# Patient Record
Sex: Male | Born: 1975 | Race: Black or African American | Hispanic: No | Marital: Single
Health system: Southern US, Community
[De-identification: ages and names within clinical notes are randomized; demographics above are authoritative.]

## PROBLEM LIST (undated history)

## (undated) DIAGNOSIS — E119 Type 2 diabetes mellitus without complications: Secondary | ICD-10-CM

## (undated) DIAGNOSIS — G510 Bell's palsy: Secondary | ICD-10-CM

## (undated) DIAGNOSIS — N189 Chronic kidney disease, unspecified: Secondary | ICD-10-CM

## (undated) HISTORY — DX: Bell's palsy: G51.0

---

## 2009-03-28 ENCOUNTER — Emergency Department (HOSPITAL_COMMUNITY): Admission: EM | Admit: 2009-03-28 | Discharge: 2009-03-28 | Payer: Self-pay | Admitting: Emergency Medicine

## 2010-07-30 ENCOUNTER — Encounter (HOSPITAL_BASED_OUTPATIENT_CLINIC_OR_DEPARTMENT_OTHER)
Admission: RE | Admit: 2010-07-30 | Discharge: 2010-08-07 | Payer: Self-pay | Source: Home / Self Care | Attending: Internal Medicine | Admitting: Internal Medicine

## 2010-07-30 ENCOUNTER — Ambulatory Visit (HOSPITAL_COMMUNITY)
Admission: RE | Admit: 2010-07-30 | Discharge: 2010-07-30 | Payer: Self-pay | Source: Home / Self Care | Attending: Internal Medicine | Admitting: Internal Medicine

## 2010-08-13 ENCOUNTER — Encounter (HOSPITAL_BASED_OUTPATIENT_CLINIC_OR_DEPARTMENT_OTHER): Payer: Self-pay | Attending: Plastic Surgery

## 2010-08-13 DIAGNOSIS — I10 Essential (primary) hypertension: Secondary | ICD-10-CM | POA: Insufficient documentation

## 2010-08-13 DIAGNOSIS — Z794 Long term (current) use of insulin: Secondary | ICD-10-CM | POA: Insufficient documentation

## 2010-08-13 DIAGNOSIS — E1165 Type 2 diabetes mellitus with hyperglycemia: Secondary | ICD-10-CM | POA: Insufficient documentation

## 2010-08-13 DIAGNOSIS — IMO0002 Reserved for concepts with insufficient information to code with codable children: Secondary | ICD-10-CM | POA: Insufficient documentation

## 2010-08-13 DIAGNOSIS — L97509 Non-pressure chronic ulcer of other part of unspecified foot with unspecified severity: Secondary | ICD-10-CM | POA: Insufficient documentation

## 2010-08-13 DIAGNOSIS — E785 Hyperlipidemia, unspecified: Secondary | ICD-10-CM | POA: Insufficient documentation

## 2010-09-10 ENCOUNTER — Encounter (HOSPITAL_BASED_OUTPATIENT_CLINIC_OR_DEPARTMENT_OTHER): Payer: Self-pay

## 2010-10-08 ENCOUNTER — Encounter (HOSPITAL_BASED_OUTPATIENT_CLINIC_OR_DEPARTMENT_OTHER): Payer: Self-pay

## 2010-10-12 LAB — CBC
HCT: 46.8 % (ref 39.0–52.0)
Hemoglobin: 16.1 g/dL (ref 13.0–17.0)
MCV: 82 fL (ref 78.0–100.0)
Platelets: 230 10*3/uL (ref 150–400)
WBC: 4.9 10*3/uL (ref 4.0–10.5)

## 2010-10-12 LAB — COMPREHENSIVE METABOLIC PANEL
Albumin: 4 g/dL (ref 3.5–5.2)
Alkaline Phosphatase: 131 U/L — ABNORMAL HIGH (ref 39–117)
BUN: 7 mg/dL (ref 6–23)
CO2: 29 mEq/L (ref 19–32)
Chloride: 99 mEq/L (ref 96–112)
Creatinine, Ser: 0.74 mg/dL (ref 0.4–1.5)
GFR calc non Af Amer: >60 mL/min (ref >60)
Glucose, Bld: 399 mg/dL — ABNORMAL HIGH (ref 70–99)
Potassium: 4.2 mEq/L (ref 3.5–5.1)
Total Bilirubin: 0.7 mg/dL (ref 0.3–1.2)

## 2010-10-12 LAB — URINALYSIS, ROUTINE W REFLEX MICROSCOPIC
Glucose, UA: >1000 mg/dL — AB
Hgb urine dipstick: NEGATIVE
Ketones, ur: 15 mg/dL — AB
Protein, ur: NEGATIVE mg/dL

## 2010-10-12 LAB — DIFFERENTIAL
Eosinophils Absolute: 0.2 10*3/uL (ref 0.0–0.7)
Eosinophils Relative: 5 % (ref 0–5)
Lymphocytes Relative: 43 % (ref 12–46)
Lymphs Abs: 2.1 10*3/uL (ref 0.7–4.0)
Monocytes Relative: 6 % (ref 3–12)
Neutro Abs: 2.3 10*3/uL (ref 1.7–7.7)
Neutrophils Relative %: 46 % (ref 43–77)

## 2010-10-12 LAB — GLUCOSE, CAPILLARY: Glucose-Capillary: 417 mg/dL — ABNORMAL HIGH (ref 70–99)

## 2010-10-12 LAB — URINE MICROSCOPIC-ADD ON: Urine-Other: NONE SEEN

## 2014-02-25 ENCOUNTER — Ambulatory Visit: Payer: Self-pay

## 2014-08-08 DIAGNOSIS — G510 Bell's palsy: Secondary | ICD-10-CM

## 2014-08-08 HISTORY — DX: Bell's palsy: G51.0

## 2014-08-20 ENCOUNTER — Encounter (HOSPITAL_COMMUNITY): Payer: Self-pay | Admitting: *Deleted

## 2014-08-20 ENCOUNTER — Emergency Department (HOSPITAL_COMMUNITY): Payer: 59

## 2014-08-20 ENCOUNTER — Emergency Department (HOSPITAL_COMMUNITY)
Admission: EM | Admit: 2014-08-20 | Discharge: 2014-08-20 | Disposition: A | Discharge status: Home / Self Care | Payer: 59 | Attending: Emergency Medicine | Admitting: Emergency Medicine

## 2014-08-20 DIAGNOSIS — R200 Anesthesia of skin: Secondary | ICD-10-CM | POA: Diagnosis present

## 2014-08-20 DIAGNOSIS — G510 Bell's palsy: Secondary | ICD-10-CM | POA: Diagnosis not present

## 2014-08-20 DIAGNOSIS — Z794 Long term (current) use of insulin: Secondary | ICD-10-CM | POA: Diagnosis not present

## 2014-08-20 DIAGNOSIS — R739 Hyperglycemia, unspecified: Secondary | ICD-10-CM

## 2014-08-20 DIAGNOSIS — E1165 Type 2 diabetes mellitus with hyperglycemia: Secondary | ICD-10-CM | POA: Diagnosis not present

## 2014-08-20 DIAGNOSIS — Z79899 Other long term (current) drug therapy: Secondary | ICD-10-CM | POA: Diagnosis not present

## 2014-08-20 HISTORY — DX: Type 2 diabetes mellitus without complications: E11.9

## 2014-08-20 LAB — CBC WITH DIFFERENTIAL/PLATELET
BASOS PCT: 1 % (ref 0–1)
Basophils Absolute: 0 10*3/uL (ref 0.0–0.1)
EOS PCT: 2 % (ref 0–5)
Eosinophils Absolute: 0.1 10*3/uL (ref 0.0–0.7)
HCT: 40.5 % (ref 39.0–52.0)
HEMOGLOBIN: 13.6 g/dL (ref 13.0–17.0)
LYMPHS ABS: 1.5 10*3/uL (ref 0.7–4.0)
Lymphocytes Relative: 26 % (ref 12–46)
MCH: 26.3 pg (ref 26.0–34.0)
MCHC: 33.6 g/dL (ref 30.0–36.0)
MCV: 78.2 fL (ref 78.0–100.0)
MONO ABS: 0.3 10*3/uL (ref 0.1–1.0)
Monocytes Relative: 6 % (ref 3–12)
Neutro Abs: 3.6 10*3/uL (ref 1.7–7.7)
Neutrophils Relative %: 65 % (ref 43–77)
Platelets: 318 10*3/uL (ref 150–400)
RBC: 5.18 MIL/uL (ref 4.22–5.81)
RDW: 12.9 % (ref 11.5–15.5)
WBC: 5.6 10*3/uL (ref 4.0–10.5)

## 2014-08-20 LAB — URINALYSIS, ROUTINE W REFLEX MICROSCOPIC
Bilirubin Urine: NEGATIVE
Ketones, ur: NEGATIVE mg/dL
LEUKOCYTES UA: NEGATIVE
Nitrite: NEGATIVE
PH: 6.5 (ref 5.0–8.0)
PROTEIN: 100 mg/dL — AB
SPECIFIC GRAVITY, URINE: 1.035 — AB (ref 1.005–1.030)
UROBILINOGEN UA: 1 mg/dL (ref 0.0–1.0)

## 2014-08-20 LAB — I-STAT VENOUS BLOOD GAS, ED
Acid-Base Excess: 3 mmol/L — ABNORMAL HIGH (ref 0.0–2.0)
BICARBONATE: 30.5 meq/L — AB (ref 20.0–24.0)
O2 Saturation: 35 %
PCO2 VEN: 54.9 mmHg — AB (ref 45.0–50.0)
PH VEN: 7.352 — AB (ref 7.250–7.300)
TCO2: 32 mmol/L (ref 0–100)
pO2, Ven: 22 mmHg — CL (ref 30.0–45.0)

## 2014-08-20 LAB — COMPREHENSIVE METABOLIC PANEL
ALK PHOS: 201 U/L — AB (ref 39–117)
ALT: 9 U/L (ref 0–53)
AST: 24 U/L (ref 0–37)
Albumin: 2.5 g/dL — ABNORMAL LOW (ref 3.5–5.2)
Anion gap: 6 (ref 5–15)
BILIRUBIN TOTAL: 0.4 mg/dL (ref 0.3–1.2)
BUN: 11 mg/dL (ref 6–23)
CALCIUM: 8.6 mg/dL (ref 8.4–10.5)
CO2: 31 mmol/L (ref 19–32)
Chloride: 96 mmol/L (ref 96–112)
Creatinine, Ser: 0.92 mg/dL (ref 0.50–1.35)
GFR calc Af Amer: >90 mL/min (ref >90)
Glucose, Bld: 528 mg/dL — ABNORMAL HIGH (ref 70–99)
Potassium: 3.9 mmol/L (ref 3.5–5.1)
SODIUM: 133 mmol/L — AB (ref 135–145)
Total Protein: 6.7 g/dL (ref 6.0–8.3)

## 2014-08-20 LAB — KETONES, QUALITATIVE: Acetone, Bld: NEGATIVE

## 2014-08-20 LAB — URINE MICROSCOPIC-ADD ON

## 2014-08-20 LAB — CBG MONITORING, ED
GLUCOSE-CAPILLARY: 416 mg/dL — AB (ref 70–99)
Glucose-Capillary: 535 mg/dL — ABNORMAL HIGH (ref 70–99)

## 2014-08-20 LAB — TROPONIN I: Troponin I: <0.03 ng/mL (ref <0.031)

## 2014-08-20 MED ORDER — VALACYCLOVIR HCL 1 G PO TABS: 1000.0000 mg | ORAL_TABLET | Freq: Three times a day (TID) | ORAL | Status: AC

## 2014-08-20 MED ORDER — INSULIN ASPART 100 UNIT/ML ~~LOC~~ SOLN: SUBCUTANEOUS | Status: DC

## 2014-08-20 MED ORDER — ARTIFICIAL TEARS OP OINT: TOPICAL_OINTMENT | Freq: Three times a day (TID) | OPHTHALMIC | Status: DC

## 2014-08-20 MED ORDER — VALACYCLOVIR HCL 500 MG PO TABS
1000.0000 mg | ORAL_TABLET | Freq: Once | ORAL | Status: AC
Start: 2014-08-20 — End: 2014-08-20
  Administered 2014-08-20: 1000 mg via ORAL
  Filled 2014-08-20: qty 2

## 2014-08-20 MED ORDER — INSULIN ASPART 100 UNIT/ML ~~LOC~~ SOLN
8.0000 [IU] | Freq: Once | SUBCUTANEOUS | Status: AC
  Administered 2014-08-20: 8 [IU] via SUBCUTANEOUS
  Filled 2014-08-20: qty 1

## 2014-08-20 MED ORDER — SODIUM CHLORIDE 0.9 % IV BOLUS (SEPSIS)
1000.0000 mL | Freq: Once | INTRAVENOUS | Status: AC
  Administered 2014-08-20: 1000 mL via INTRAVENOUS

## 2014-08-20 NOTE — ED Provider Notes (Signed)
CSN: [id number]     Arrival date & time 08/20/14  9326 History  This chart was scribed for Glynn Octave, MD by Leone Payor, ED Scribe. This patient was seen in room B18C/B18C and the patient's care was started 7:53 AM.    Chief Complaint  Patient presents with  . Numbness   The history is provided by the patient. No language interpreter was used.     HPI Comments: Shane Dougherty is a 39 y.o. male with past medical history of DM who presents to the Emergency Department complaining of 4 days of constant, unchanged right sided facial droop. Patient states he has associated difficulty chewing and his right eye blinks slower than the left. He states that friends and family noticed his symptoms before he did. He also complains of an intermittent frontal HA that is similar to his regular migraine. He denies blurry or double vision, gait abnormality, weakness or numbness in extremities, aphasia, rash.   Past Medical History  Diagnosis Date  . Diabetes mellitus without complication    History reviewed. No pertinent past surgical history. History reviewed. No pertinent family history. History  Substance Use Topics  . Smoking status: Passive Smoke Exposure - Never Smoker  . Smokeless tobacco: Not on file  . Alcohol Use: No    Review of Systems  A complete 10 system review of systems was obtained and all systems are negative except as noted in the HPI and PMH.    Allergies  Bee venom  Home Medications   Prior to Admission medications   Medication Sig Start Date End Date Taking? Authorizing Provider  insulin glargine (LANTUS) 100 UNIT/ML injection Inject 1-10 Units into the skin every morning. Sliding scale based on fasting BG   Yes Historical Provider, MD  Multiple Vitamins-Minerals (MULTIVITAMIN WITH MINERALS) tablet Take 1 tablet by mouth daily.   Yes Historical Provider, MD  polyvinyl alcohol (LIQUIFILM TEARS) 1.4 % ophthalmic solution Place 3-4 drops into both eyes as needed for dry  eyes.   Yes Historical Provider, MD  artificial tears (LACRILUBE) OINT ophthalmic ointment Place into the right eye 3 (three) times daily. 08/20/14   Glynn Octave, MD  insulin aspart (NOVOLOG) 100 UNIT/ML injection Your sliding scale with meals 08/20/14   Glynn Octave, MD  valACYclovir (VALTREX) 1000 MG tablet Take 1 tablet (1,000 mg total) by mouth 3 (three) times daily. 08/20/14 09/03/14  Glynn Octave, MD   BP 161/100 mmHg  Pulse 93  Temp(Src) 98 F (36.7 C) (Oral)  Resp 15  Ht 5\' 7"  (1.702 m)  Wt 160 lb (72.576 kg)  BMI 25.05 kg/m2  SpO2 100% Physical Exam  Constitutional: He is oriented to person, place, and time. He appears well-developed and well-nourished. No distress.  HENT:  Head: Normocephalic and atraumatic.  Mouth/Throat: Oropharynx is clear and moist. No oropharyngeal exudate.  Eyes: Conjunctivae and EOM are normal. Pupils are equal, round, and reactive to light.  Neck: Normal range of motion. Neck supple.  No meningismus.  Cardiovascular: Normal rate, regular rhythm, normal heart sounds and intact distal pulses.   No murmur heard. Pulmonary/Chest: Effort normal and breath sounds normal. No respiratory distress.  Abdominal: Soft. There is no tenderness. There is no rebound and no guarding.  Musculoskeletal: Normal range of motion. He exhibits no edema or tenderness.  Neurological: He is alert and oriented to person, place, and time. A cranial nerve deficit is present. Coordination normal.  No ataxia on finger to nose bilaterally. No pronator drift. 5/5 strength  throughout. Right facial droop, tongue midline, forehead flaccid. Negative Romberg. Equal grip strength. Sensation intact. Gait is normal.   Skin: Skin is warm.  Psychiatric: He has a normal mood and affect. His behavior is normal.  Nursing note and vitals reviewed.   ED Course  Procedures (including critical care time)  DIAGNOSTIC STUDIES: Oxygen Saturation is 100% on RA, normal by my interpretation.     COORDINATION OF CARE: 7:57 AM Will order lab work and brain MRI. Discussed treatment plan with pt at bedside and pt agreed to plan.   Labs Review Labs Reviewed  COMPREHENSIVE METABOLIC PANEL - Abnormal; Notable for the following:    Sodium 133 (*)    Glucose, Bld 528 (*)    Albumin 2.5 (*)    Alkaline Phosphatase 201 (*)    All other components within normal limits  URINALYSIS, ROUTINE W REFLEX MICROSCOPIC - Abnormal; Notable for the following:    Specific Gravity, Urine 1.035 (*)    Glucose, UA >1000 (*)    Hgb urine dipstick SMALL (*)    Protein, ur 100 (*)    All other components within normal limits  CBG MONITORING, ED - Abnormal; Notable for the following:    Glucose-Capillary 535 (*)    All other components within normal limits  I-STAT VENOUS BLOOD GAS, ED - Abnormal; Notable for the following:    pH, Ven 7.352 (*)    pCO2, Ven 54.9 (*)    pO2, Ven 22.0 (*)    Bicarbonate 30.5 (*)    Acid-Base Excess 3.0 (*)    All other components within normal limits  CBG MONITORING, ED - Abnormal; Notable for the following:    Glucose-Capillary 416 (*)    All other components within normal limits  CBC WITH DIFFERENTIAL/PLATELET  TROPONIN I  KETONES, QUALITATIVE  URINE MICROSCOPIC-ADD ON  CBG MONITORING, ED    Imaging Review Mr Brain Wo Contrast  08/20/2014   CLINICAL DATA:  Four days of RIGHT facial droop. Difficulty chewing and blinking. Intermittent frontal headaches. History of diabetes.  EXAM: MRI HEAD WITHOUT CONTRAST  TECHNIQUE: Multiplanar, multiecho pulse sequences of the brain and surrounding structures were obtained without intravenous contrast.  COMPARISON:  None.  FINDINGS: The patient was unable to remain motionless for the exam. Small or subtle lesions could be overlooked.  No evidence for acute infarction, hemorrhage, mass lesion, hydrocephalus, or extra-axial fluid. Normal for age cerebral volume. No white matter disease. No midline abnormality or vascular  occlusion.  Extracranial soft tissues demonstrate normal paranasal sinuses and orbits. BILATERAL cervical lymph nodes are incompletely evaluated on this study.  Trace BILATERAL mastoid effusions appear non worrisome. No middle ear fluid. No visible parotid lesion.  IMPRESSION: No evidence for acute intracranial infarction or mass lesion. No posterior fossa pathology.  Trace BILATERAL mastoid effusions without apparent significant temporal bone inflammatory disease.  If the patient has peripheral CN VIIth nerve dysfunction, viral perineuritis or Bell's palsy may not be detected on noncontrast MR imaging.   Electronically Signed   By: Davonna Belling M.D.   On: 08/20/2014 09:24     EKG Interpretation   Date/Time:  Saturday August 20 2014 10:39:38 EST Ventricular Rate:  95 PR Interval:  132 QRS Duration: 76 QT Interval:  347 QTC Calculation: 436 R Axis:   13 Text Interpretation:  Sinus rhythm Probable anteroseptal infarct, old  Borderline ST elevation, lateral leads Baseline wander in lead(s) V3 V4 V5  V6 No previous ECGs available Confirmed by Manus Gunning  MD,  Maham Quintin [phone]) on  08/20/2014 10:53:08 AM      MDM   Final diagnoses:  Bell's palsy  Hyperglycemia   4 day history of right-sided facial numbness and facial droop. No weakness in arms or legs. Suspect bell's palsy given flaccid forehead.  Patient is a diabetic. Blood sugar greater than 500. Patient given IV fluids and insulin.  Hyperglycemia without DKA. Normal anion gap.  Suspect Bell's palsy. MRI negative for acute stroke.  Sugar improved to 419 after IV fluids and insulin. Normal anion gap and no ketones in urine. Patient has had facial droop for 4 days. Will not give steroids given his hyperglycemia noncompliance with his insulin regimen. We'll treat with Valtrex.  D/w Dr. Hosie Poisson who agrees no role for prednisone given delayed presentation hyperglycemia. We'll refill patient's NovoLog insulin which she has been out of. He has  his Lantus. Follow-up with PCP as scheduled. Eye lubrication discussed. Return to ED with their worsening symptoms.  I personally performed the services described in this documentation, which was scribed in my presence. The recorded information has been reviewed and is accurate.   Glynn Octave, MD 08/20/14 1201

## 2014-08-20 NOTE — ED Notes (Signed)
Numbness, and feels like he cannot move it. There is rt. Sided facial droop, tongue is midline, no dysarthria, no trouble eating or swallowing, no problem with gait.

## 2014-08-20 NOTE — ED Notes (Signed)
Patient transported to MRI 

## 2014-08-20 NOTE — Discharge Instructions (Signed)
Bell's Palsy Take the antiviral medications and your insulin as prescribed. Follow-up with your doctors. Keep your right eye lubricated. Return to the ED if you develop new or worsening symptoms. Bell's palsy is a condition in which the muscles on one side of the face cannot move (paralysis). This is because the nerves in the face are paralyzed. It is most often thought to be caused by a virus. The virus causes swelling of the nerve that controls movement on one side of the face. The nerve travels through a tight space surrounded by bone. When the nerve swells, it can be compressed by the bone. This results in damage to the protective covering around the nerve. This damage interferes with how the nerve communicates with the muscles of the face. As a result, it can cause weakness or paralysis of the facial muscles.  Injury (trauma), tumor, and surgery may cause Bell's palsy, but most of the time the cause is unknown. It is a relatively common condition. It starts suddenly (abrupt onset) with the paralysis usually ending within 2 days. Bell's palsy is not dangerous. But because the eye does not close properly, you may need care to keep the eye from getting dry. This can include splinting (to keep the eye shut) or moistening with artificial tears. Bell's palsy very seldom occurs on both sides of the face at the same time. SYMPTOMS   Eyebrow sagging.  Drooping of the eyelid and corner of the mouth.  Inability to close one eye.  Loss of taste on the front of the tongue.  Sensitivity to loud noises. TREATMENT  The treatment is usually non-surgical. If the patient is seen within the first 24 to 48 hours, a short course of steroids may be prescribed, in an attempt to shorten the length of the condition. Antiviral medicines may also be used with the steroids, but it is unclear if they are helpful.  You will need to protect your eye, if you cannot close it. The cornea (clear covering over your eye) will  become dry and can be damaged. Artificial tears can be used to keep your eye moist. Glasses or an eye patch should be worn to protect your eye. PROGNOSIS  Recovery is variable, ranging from days to months. Although the problem usually goes away completely (about 80% of cases resolve), predicting the outcome is impossible. Most people improve within 3 weeks of when the symptoms began. Improvement may continue for 3 to 6 months. A small number of people have moderate to severe weakness that is permanent.  HOME CARE INSTRUCTIONS   If your caregiver prescribed medication to reduce swelling in the nerve, use as directed. Do not stop taking the medication unless directed by your caregiver.  Use moisturizing eye drops as needed to prevent drying of your eye, as directed by your caregiver.  Protect your eye, as directed by your caregiver.  Use facial massage and exercises, as directed by your caregiver.  Perform your normal activities, and get your normal rest. SEEK IMMEDIATE MEDICAL CARE IF:   There is pain, redness or irritation in the eye.  You or your child has an oral temperature above 102 F (38.9 C), not controlled by medicine. MAKE SURE YOU:   Understand these instructions.  Will watch your condition.  Will get help right away if you are not doing well or get worse. Document Released: 06/24/2005 Document Revised: 09/16/2011 Document Reviewed: 10/01/2013 [facility] Patient Information 2015 [place], [place]. This information is not intended to replace advice given  to you by your health care provider. Make sure you discuss any questions you have with your health care provider.

## 2014-11-25 ENCOUNTER — Emergency Department (HOSPITAL_COMMUNITY)
Admission: EM | Admit: 2014-11-25 | Discharge: 2014-11-25 | Disposition: A | Discharge status: Home / Self Care | Payer: 59 | Attending: Emergency Medicine | Admitting: Emergency Medicine

## 2014-11-25 ENCOUNTER — Encounter (HOSPITAL_COMMUNITY): Payer: Self-pay | Admitting: Neurology

## 2014-11-25 DIAGNOSIS — T798XXA Other early complications of trauma, initial encounter: Secondary | ICD-10-CM

## 2014-11-25 DIAGNOSIS — Z79899 Other long term (current) drug therapy: Secondary | ICD-10-CM | POA: Insufficient documentation

## 2014-11-25 DIAGNOSIS — Z23 Encounter for immunization: Secondary | ICD-10-CM | POA: Insufficient documentation

## 2014-11-25 DIAGNOSIS — Z794 Long term (current) use of insulin: Secondary | ICD-10-CM | POA: Insufficient documentation

## 2014-11-25 DIAGNOSIS — E119 Type 2 diabetes mellitus without complications: Secondary | ICD-10-CM | POA: Insufficient documentation

## 2014-11-25 DIAGNOSIS — L089 Local infection of the skin and subcutaneous tissue, unspecified: Secondary | ICD-10-CM | POA: Insufficient documentation

## 2014-11-25 LAB — CBC WITH DIFFERENTIAL/PLATELET
Basophils Absolute: 0 10*3/uL (ref 0.0–0.1)
Basophils Relative: 0 % (ref 0–1)
EOS PCT: 3 % (ref 0–5)
Eosinophils Absolute: 0.2 10*3/uL (ref 0.0–0.7)
HCT: 38.9 % — ABNORMAL LOW (ref 39.0–52.0)
Hemoglobin: 12.8 g/dL — ABNORMAL LOW (ref 13.0–17.0)
LYMPHS PCT: 23 % (ref 12–46)
Lymphs Abs: 1.6 10*3/uL (ref 0.7–4.0)
MCH: 25.7 pg — ABNORMAL LOW (ref 26.0–34.0)
MCHC: 32.9 g/dL (ref 30.0–36.0)
MCV: 78 fL (ref 78.0–100.0)
Monocytes Absolute: 0.3 10*3/uL (ref 0.1–1.0)
Monocytes Relative: 4 % (ref 3–12)
NEUTROS PCT: 70 % (ref 43–77)
Neutro Abs: 4.8 10*3/uL (ref 1.7–7.7)
Platelets: 305 10*3/uL (ref 150–400)
RBC: 4.99 MIL/uL (ref 4.22–5.81)
RDW: 14.1 % (ref 11.5–15.5)
WBC: 6.9 10*3/uL (ref 4.0–10.5)

## 2014-11-25 LAB — I-STAT CHEM 8, ED
BUN: 10 mg/dL (ref 6–20)
CALCIUM ION: 0.99 mmol/L — AB (ref 1.12–1.23)
Chloride: 103 mmol/L (ref 101–111)
Creatinine, Ser: 0.6 mg/dL — ABNORMAL LOW (ref 0.61–1.24)
Glucose, Bld: 357 mg/dL — ABNORMAL HIGH (ref 65–99)
HEMATOCRIT: 41 % (ref 39.0–52.0)
Hemoglobin: 13.9 g/dL (ref 13.0–17.0)
Potassium: 4 mmol/L (ref 3.5–5.1)
SODIUM: 135 mmol/L (ref 135–145)
TCO2: 18 mmol/L (ref 0–100)

## 2014-11-25 LAB — CBG MONITORING, ED
GLUCOSE-CAPILLARY: 321 mg/dL — AB (ref 65–99)
GLUCOSE-CAPILLARY: 277 mg/dL — AB (ref 65–99)

## 2014-11-25 LAB — BASIC METABOLIC PANEL
ANION GAP: 9 (ref 5–15)
BUN: 8 mg/dL (ref 6–20)
CALCIUM: 8.3 mg/dL — AB (ref 8.9–10.3)
CO2: 26 mmol/L (ref 22–32)
Chloride: 94 mmol/L — ABNORMAL LOW (ref 101–111)
Creatinine, Ser: 0.88 mg/dL (ref 0.61–1.24)
GFR calc Af Amer: >60 mL/min (ref >60)
GFR calc non Af Amer: >60 mL/min (ref >60)
GLUCOSE: 614 mg/dL — AB (ref 65–99)
Potassium: 4.3 mmol/L (ref 3.5–5.1)
SODIUM: 129 mmol/L — AB (ref 135–145)

## 2014-11-25 MED ORDER — SODIUM CHLORIDE 0.9 % IV SOLN
1000.0000 mL | Freq: Once | INTRAVENOUS | Status: AC
  Administered 2014-11-25: 1000 mL via INTRAVENOUS

## 2014-11-25 MED ORDER — DEXTROSE-NACL 5-0.45 % IV SOLN: INTRAVENOUS | Status: DC

## 2014-11-25 MED ORDER — SODIUM CHLORIDE 0.9 % IV SOLN
INTRAVENOUS | Status: DC
  Administered 2014-11-25: 2.6 [IU]/h via INTRAVENOUS
  Filled 2014-11-25: qty 2.5

## 2014-11-25 MED ORDER — TETANUS-DIPHTH-ACELL PERTUSSIS 5-2.5-18.5 LF-MCG/0.5 IM SUSP
0.5000 mL | Freq: Once | INTRAMUSCULAR | Status: AC
  Administered 2014-11-25: 0.5 mL via INTRAMUSCULAR
  Filled 2014-11-25: qty 0.5

## 2014-11-25 MED ORDER — CEPHALEXIN 500 MG PO CAPS: 500.0000 mg | ORAL_CAPSULE | Freq: Four times a day (QID) | ORAL | Status: DC

## 2014-11-25 MED ORDER — BACITRACIN 500 UNIT/GM EX OINT
1.0000 "application " | TOPICAL_OINTMENT | Freq: Two times a day (BID) | CUTANEOUS | Status: DC
  Administered 2014-11-25: 1 via TOPICAL
  Filled 2014-11-25 (×3): qty 0.9

## 2014-11-25 MED ORDER — SODIUM CHLORIDE 0.9 % IV SOLN
1000.0000 mL | INTRAVENOUS | Status: DC
  Administered 2014-11-25: 1000 mL via INTRAVENOUS

## 2014-11-25 NOTE — Discharge Instructions (Signed)
Wound Infection An appointment has been scheduled for you at the [facility] for Thursday, 12/01/2014 at 10:15 AM. Location is [address] [place], [place] [address]. Office number is [phone]. Take the antibiotic as prescribed(keflex). Wash your wounds daily under the shower and then place a thin layer of antibiotic ointment over the wound and cover with a sterile bandage.  Take your insulin as prescribed. Today's was elevated at 614. Get your blood pressure rechecked at your next appointment with your primary care physician on 12/09/2014. Today's was elevated at 161/96  A wound infection happens when a type of germ (bacteria) starts growing in the wound. In some cases, this can cause the wound to break open. If cared for properly, the infected wound will heal from the inside to the outside. Wound infections need treatment. CAUSES An infection is caused by bacteria growing in the wound.  SYMPTOMS   Increase in redness, swelling, or pain at the wound site.  Increase in drainage at the wound site.  Wound or bandage (dressing) starts to smell bad.  Fever.  Feeling tired or fatigued.  Pus draining from the wound. TREATMENT  Your health care provider will prescribe antibiotic medicine. The wound infection should improve within 24 to 48 hours. Any redness around the wound should stop spreading and the wound should be less painful.  HOME CARE INSTRUCTIONS   Only take over-the-counter or prescription medicines for pain, discomfort, or fever as directed by your health care provider.  Take your antibiotics as directed. Finish them even if you start to feel better.  Gently wash the area with mild soap and water 2 times a day, or as directed. Rinse off the soap. Pat the area dry with a clean towel. Do not rub the wound. This may cause bleeding.  Follow your health care provider's instructions for how often you need to change the dressing.  Apply ointment  and a dressing to the wound as directed.  If the dressing sticks, moisten it with soapy water and gently remove it.  Change the bandage right away if it becomes wet, dirty, or develops a bad smell.  Take showers. Do not take tub baths, swim, or do anything that may soak the wound until it is healed.  Avoid exercises that make you sweat heavily.  Use anti-itch medicine as directed by your health care provider. The wound may itch when it is healing. Do not pick or scratch at the wound.  Follow up with your health care provider to get your wound rechecked as directed. SEEK MEDICAL CARE IF:  You have an increase in swelling, pain, or redness around the wound.  You have an increase in the amount of pus coming from the wound.  There is a bad smell coming from the wound.  More of the wound breaks open.  You have a fever. MAKE SURE YOU:   Understand these instructions.  Will watch your condition.  Will get help right away if you are not doing well or get worse. Document Released: 03/23/2003 Document Revised: 06/29/2013 Document Reviewed: 10/28/2010 [facility] Patient Information 2015 [place], [place]. This information is not intended to replace advice given to you by your health care provider. Make sure you discuss any questions you have with your health care provider.

## 2014-11-25 NOTE — ED Provider Notes (Signed)
CSN: [id number]     Arrival date & time 11/25/14  1221 History   First MD Initiated Contact with Patient 11/25/14 1455     Chief Complaint  Patient presents with  . Wound Infection     (Consider location/radiation/quality/duration/timing/severity/associated sxs/prior Treatment) HPI Patient with wound at left calf which occurred at the end of April 2015 as result of physical therapy. States wound is not painful however this morning he noted drainage and pus from the edges of the scab along the wound. He denies fever denies feeling ill. Patient did not take his insulin this morning. No treatment prior to coming here. He had no other associated symptoms. Past Medical History  Diagnosis Date  . Diabetes mellitus without complication    History reviewed. No pertinent past surgical history. No family history on file. History  Substance Use Topics  . Smoking status: Passive Smoke Exposure - Never Smoker  . Smokeless tobacco: Not on file  . Alcohol Use: No    no illicit drug use Review of Systems  Constitutional: Negative.   HENT: Negative.   Respiratory: Negative.   Cardiovascular: Negative.   Gastrointestinal: Negative.   Musculoskeletal: Negative.   Skin: Positive for wound.  Neurological: Negative.   Psychiatric/Behavioral: Negative.   All other systems reviewed and are negative.     Allergies  Bee venom  Home Medications   Prior to Admission medications   Medication Sig Start Date End Date Taking? Authorizing Provider  artificial tears (LACRILUBE) OINT ophthalmic ointment Place into the right eye 3 (three) times daily. 08/20/14   Glynn Octave, MD  insulin aspart (NOVOLOG) 100 UNIT/ML injection Your sliding scale with meals 08/20/14   Glynn Octave, MD  insulin glargine (LANTUS) 100 UNIT/ML injection Inject 1-10 Units into the skin every morning. Sliding scale based on fasting BG    Historical Provider, MD  Multiple Vitamins-Minerals (MULTIVITAMIN WITH MINERALS)  tablet Take 1 tablet by mouth daily.    Historical Provider, MD  polyvinyl alcohol (LIQUIFILM TEARS) 1.4 % ophthalmic solution Place 3-4 drops into both eyes as needed for dry eyes.    Historical Provider, MD   BP 157/78 mmHg  Pulse 109  Temp(Src) 98 F (36.7 C) (Oral)  Resp 16  SpO2 99% Physical Exam  Constitutional: He appears well-developed and well-nourished.  HENT:  Head: Normocephalic and atraumatic.  Eyes: Conjunctivae are normal. Pupils are equal, round, and reactive to light.  Neck: Neck supple. No tracheal deviation present. No thyromegaly present.  Cardiovascular: Normal rate and regular rhythm.   No murmur heard. Pulmonary/Chest: Effort normal and breath sounds normal.  Abdominal: Soft. Bowel sounds are normal. He exhibits no distension. There is no tenderness.  Musculoskeletal: Normal range of motion. He exhibits no edema or tenderness.  Neurological: He is alert. Coordination normal.  Skin: Skin is warm and dry. No rash noted.  5 cm x 6 cm oblong scabbed lesion at calf with purulent drainage emanating from the edges of scab. No surrounding tenderness. He has minimal redness around the edges of the wound. No red streaks of leg. DP pulse 2+.  Psychiatric: He has a normal mood and affect.  Nursing note and vitals reviewed.   ED Course  Procedures (including critical care time) Labs Review Labs Reviewed - No data to display  Imaging Review No results found.   EKG Interpretation None     Patient was placed on intravenous insulin drip with glucose stabilizer due to extremely elevated glucose. He did not take his insulin today.  Results for orders placed or performed during the hospital encounter of 11/25/14  Basic metabolic panel  Result Value Ref Range   Sodium 129 (L) 135 - 145 mmol/L   Potassium 4.3 3.5 - 5.1 mmol/L   Chloride 94 (L) 101 - 111 mmol/L   CO2 26 22 - 32 mmol/L   Glucose, Bld 614 (HH) 65 - 99 mg/dL   BUN 8 6 - 20 mg/dL   Creatinine, Ser 4.09  0.61 - 1.24 mg/dL   Calcium 8.3 (L) 8.9 - 10.3 mg/dL   GFR calc non Af Amer >60 >60 mL/min   GFR calc Af Amer >60 >60 mL/min   Anion gap 9 5 - 15  CBC with Differential/Platelet  Result Value Ref Range   WBC 6.9 4.0 - 10.5 K/uL   RBC 4.99 4.22 - 5.81 MIL/uL   Hemoglobin 12.8 (L) 13.0 - 17.0 g/dL   HCT 81.1 (L) 91.4 - 78.2 %   MCV 78.0 78.0 - 100.0 fL   MCH 25.7 (L) 26.0 - 34.0 pg   MCHC 32.9 30.0 - 36.0 g/dL   RDW 95.6 21.3 - 08.6 %   Platelets 305 150 - 400 K/uL   Neutrophils Relative % 70 43 - 77 %   Neutro Abs 4.8 1.7 - 7.7 K/uL   Lymphocytes Relative 23 12 - 46 %   Lymphs Abs 1.6 0.7 - 4.0 K/uL   Monocytes Relative 4 3 - 12 %   Monocytes Absolute 0.3 0.1 - 1.0 K/uL   Eosinophils Relative 3 0 - 5 %   Eosinophils Absolute 0.2 0.0 - 0.7 K/uL   Basophils Relative 0 0 - 1 %   Basophils Absolute 0.0 0.0 - 0.1 K/uL  POC CBG, ED  Result Value Ref Range   Glucose-Capillary 321 (H) 65 - 99 mg/dL  I-Stat Chem 8, ED  Result Value Ref Range   Sodium 135 135 - 145 mmol/L   Potassium 4.0 3.5 - 5.1 mmol/L   Chloride 103 101 - 111 mmol/L   BUN 10 6 - 20 mg/dL   Creatinine, Ser 5.78 (L) 0.61 - 1.24 mg/dL   Glucose, Bld 469 (H) 65 - 99 mg/dL   Calcium, Ion 6.29 (L) 1.12 - 1.23 mmol/L   TCO2 18 0 - 100 mmol/L   Hemoglobin 13.9 13.0 - 17.0 g/dL   HCT 52.8 41.3 - 24.4 %  CBG monitoring, ED  Result Value Ref Range   Glucose-Capillary 277 (H) 65 - 99 mg/dL   No results found.  MDM  Wound is chronic. I believe the patient would benefit from a wound center. An appointment has been made for him at the [facility] for Thursday, 12/01/2014 at 10:15 AM plan prescription Keflex Final diagnoses:  None   Patient has an appointment with a new primary care physician scheduled for 12/09/2014. He is encouraged to take his insulin as prescribed. Local wound care. Diagnosis #1 infected wound of left calf #2 hyperglycemia #3 medication noncompliance #4 elevated blood  pressure    Doug Sou, MD 11/25/14 2042

## 2014-11-25 NOTE — ED Notes (Signed)
Pt has wound to posterior left middle leg for 3 months but yesterday noticed white pus fluid draining for edges. Pt is diabetic, no fevers.

## 2014-11-25 NOTE — ED Notes (Signed)
Cleaned left calf wound with wound cleanser and applied bacitracin 4x4 gauze and gauze wrap tolerated without incident.

## 2014-11-25 NOTE — ED Notes (Signed)
Patient states takes insulin every morning at 10am. Did not take insulin yesterday due to a funeral. Today did not take insulin and ate lunch prior to arrival.

## 2015-04-03 ENCOUNTER — Inpatient Hospital Stay (HOSPITAL_COMMUNITY)
Admission: EM | Admit: 2015-04-03 | Discharge: 2015-04-08 | DRG: 603 | Disposition: A | Discharge status: Home / Self Care | Payer: Self-pay | Attending: Internal Medicine | Admitting: Internal Medicine

## 2015-04-03 ENCOUNTER — Emergency Department (HOSPITAL_COMMUNITY): Payer: Self-pay

## 2015-04-03 ENCOUNTER — Inpatient Hospital Stay (HOSPITAL_COMMUNITY): Payer: Self-pay

## 2015-04-03 ENCOUNTER — Encounter (HOSPITAL_COMMUNITY): Payer: Self-pay | Admitting: Emergency Medicine

## 2015-04-03 DIAGNOSIS — E860 Dehydration: Secondary | ICD-10-CM | POA: Diagnosis present

## 2015-04-03 DIAGNOSIS — E871 Hypo-osmolality and hyponatremia: Secondary | ICD-10-CM | POA: Diagnosis present

## 2015-04-03 DIAGNOSIS — D638 Anemia in other chronic diseases classified elsewhere: Secondary | ICD-10-CM | POA: Diagnosis present

## 2015-04-03 DIAGNOSIS — D649 Anemia, unspecified: Secondary | ICD-10-CM | POA: Insufficient documentation

## 2015-04-03 DIAGNOSIS — E1165 Type 2 diabetes mellitus with hyperglycemia: Secondary | ICD-10-CM | POA: Diagnosis present

## 2015-04-03 DIAGNOSIS — I440 Atrioventricular block, first degree: Secondary | ICD-10-CM | POA: Diagnosis present

## 2015-04-03 DIAGNOSIS — W57XXXA Bitten or stung by nonvenomous insect and other nonvenomous arthropods, initial encounter: Secondary | ICD-10-CM | POA: Diagnosis present

## 2015-04-03 DIAGNOSIS — A419 Sepsis, unspecified organism: Secondary | ICD-10-CM

## 2015-04-03 DIAGNOSIS — I10 Essential (primary) hypertension: Secondary | ICD-10-CM | POA: Diagnosis present

## 2015-04-03 DIAGNOSIS — L03211 Cellulitis of face: Principal | ICD-10-CM | POA: Diagnosis present

## 2015-04-03 DIAGNOSIS — R0789 Other chest pain: Secondary | ICD-10-CM | POA: Diagnosis present

## 2015-04-03 DIAGNOSIS — E119 Type 2 diabetes mellitus without complications: Secondary | ICD-10-CM

## 2015-04-03 DIAGNOSIS — Z794 Long term (current) use of insulin: Secondary | ICD-10-CM

## 2015-04-03 DIAGNOSIS — IMO0001 Reserved for inherently not codable concepts without codable children: Secondary | ICD-10-CM | POA: Diagnosis present

## 2015-04-03 DIAGNOSIS — Z833 Family history of diabetes mellitus: Secondary | ICD-10-CM

## 2015-04-03 DIAGNOSIS — L0201 Cutaneous abscess of face: Secondary | ICD-10-CM

## 2015-04-03 DIAGNOSIS — Z8249 Family history of ischemic heart disease and other diseases of the circulatory system: Secondary | ICD-10-CM

## 2015-04-03 LAB — CBC
HCT: 37 % — ABNORMAL LOW (ref 39.0–52.0)
Hemoglobin: 12.2 g/dL — ABNORMAL LOW (ref 13.0–17.0)
MCH: 26.2 pg (ref 26.0–34.0)
MCHC: 33 g/dL (ref 30.0–36.0)
MCV: 79.4 fL (ref 78.0–100.0)
Platelets: 333 10*3/uL (ref 150–400)
RBC: 4.66 MIL/uL (ref 4.22–5.81)
RDW: 13.8 % (ref 11.5–15.5)
WBC: 10.9 10*3/uL — ABNORMAL HIGH (ref 4.0–10.5)

## 2015-04-03 LAB — BASIC METABOLIC PANEL
ANION GAP: 8 (ref 5–15)
BUN: 11 mg/dL (ref 6–20)
CALCIUM: 8.5 mg/dL — AB (ref 8.9–10.3)
CO2: 28 mmol/L (ref 22–32)
Chloride: 94 mmol/L — ABNORMAL LOW (ref 101–111)
Creatinine, Ser: 1 mg/dL (ref 0.61–1.24)
GFR calc Af Amer: >60 mL/min (ref >60)
Glucose, Bld: 583 mg/dL (ref 65–99)
Potassium: 3.7 mmol/L (ref 3.5–5.1)
Sodium: 130 mmol/L — ABNORMAL LOW (ref 135–145)

## 2015-04-03 LAB — GLUCOSE, CAPILLARY
GLUCOSE-CAPILLARY: 305 mg/dL — AB (ref 65–99)
Glucose-Capillary: 232 mg/dL — ABNORMAL HIGH (ref 65–99)

## 2015-04-03 LAB — I-STAT TROPONIN, ED: TROPONIN I, POC: 0 ng/mL (ref 0.00–0.08)

## 2015-04-03 MED ORDER — ACETAMINOPHEN 325 MG PO TABS
650.0000 mg | ORAL_TABLET | Freq: Four times a day (QID) | ORAL | Status: DC | PRN
  Administered 2015-04-04 – 2015-04-06 (×6): 650 mg via ORAL
  Filled 2015-04-03 (×8): qty 2

## 2015-04-03 MED ORDER — SODIUM CHLORIDE 0.9 % IV SOLN
INTRAVENOUS | Status: DC
  Administered 2015-04-03 – 2015-04-05 (×3): via INTRAVENOUS

## 2015-04-03 MED ORDER — INSULIN ASPART 100 UNIT/ML ~~LOC~~ SOLN
6.0000 [IU] | Freq: Once | SUBCUTANEOUS | Status: AC
  Administered 2015-04-03: 6 [IU] via INTRAVENOUS
  Filled 2015-04-03: qty 1

## 2015-04-03 MED ORDER — VANCOMYCIN HCL IN DEXTROSE 1-5 GM/200ML-% IV SOLN: 1000.0000 mg | Freq: Once | INTRAVENOUS | Status: DC

## 2015-04-03 MED ORDER — VANCOMYCIN HCL 10 G IV SOLR
1500.0000 mg | Freq: Once | INTRAVENOUS | Status: AC
  Administered 2015-04-03: 1500 mg via INTRAVENOUS
  Filled 2015-04-03: qty 1500

## 2015-04-03 MED ORDER — ONDANSETRON HCL 4 MG/2ML IJ SOLN: 4.0000 mg | Freq: Four times a day (QID) | INTRAMUSCULAR | Status: DC | PRN

## 2015-04-03 MED ORDER — INSULIN ASPART 100 UNIT/ML ~~LOC~~ SOLN
0.0000 [IU] | Freq: Three times a day (TID) | SUBCUTANEOUS | Status: DC
  Administered 2015-04-03: 11 [IU] via SUBCUTANEOUS
  Administered 2015-04-04: 8 [IU] via SUBCUTANEOUS
  Administered 2015-04-04: 5 [IU] via SUBCUTANEOUS
  Administered 2015-04-04: 11 [IU] via SUBCUTANEOUS
  Administered 2015-04-05: 3 [IU] via SUBCUTANEOUS
  Administered 2015-04-05 (×2): 8 [IU] via SUBCUTANEOUS
  Administered 2015-04-06: 2 [IU] via SUBCUTANEOUS
  Administered 2015-04-06: 8 [IU] via SUBCUTANEOUS
  Administered 2015-04-06: 5 [IU] via SUBCUTANEOUS
  Administered 2015-04-07: 15 [IU] via SUBCUTANEOUS

## 2015-04-03 MED ORDER — VANCOMYCIN HCL IN DEXTROSE 1-5 GM/200ML-% IV SOLN
1000.0000 mg | Freq: Two times a day (BID) | INTRAVENOUS | Status: DC
  Administered 2015-04-04 – 2015-04-07 (×7): 1000 mg via INTRAVENOUS
  Filled 2015-04-03 (×8): qty 200

## 2015-04-03 MED ORDER — ENOXAPARIN SODIUM 40 MG/0.4ML ~~LOC~~ SOLN
40.0000 mg | SUBCUTANEOUS | Status: DC
  Administered 2015-04-03 – 2015-04-07 (×5): 40 mg via SUBCUTANEOUS
  Filled 2015-04-03 (×5): qty 0.4

## 2015-04-03 MED ORDER — MORPHINE SULFATE (PF) 2 MG/ML IV SOLN
2.0000 mg | INTRAVENOUS | Status: DC | PRN
  Administered 2015-04-05 – 2015-04-08 (×7): 2 mg via INTRAVENOUS
  Filled 2015-04-03 (×8): qty 1

## 2015-04-03 MED ORDER — SODIUM CHLORIDE 0.9 % IV BOLUS (SEPSIS)
1000.0000 mL | Freq: Once | INTRAVENOUS | Status: AC
  Administered 2015-04-03: 1000 mL via INTRAVENOUS

## 2015-04-03 MED ORDER — IOHEXOL 300 MG/ML  SOLN
75.0000 mL | Freq: Once | INTRAMUSCULAR | Status: AC | PRN
  Administered 2015-04-03: 75 mL via INTRAVENOUS

## 2015-04-03 MED ORDER — PIPERACILLIN-TAZOBACTAM 3.375 G IVPB
3.3750 g | Freq: Three times a day (TID) | INTRAVENOUS | Status: DC
  Administered 2015-04-03 – 2015-04-08 (×15): 3.375 g via INTRAVENOUS
  Filled 2015-04-03 (×19): qty 50

## 2015-04-03 MED ORDER — CLINDAMYCIN PHOSPHATE 900 MG/50ML IV SOLN: 900.0000 mg | Freq: Once | INTRAVENOUS | Status: DC

## 2015-04-03 MED ORDER — ACETAMINOPHEN 650 MG RE SUPP: 650.0000 mg | Freq: Four times a day (QID) | RECTAL | Status: DC | PRN

## 2015-04-03 MED ORDER — POLYVINYL ALCOHOL 1.4 % OP SOLN
3.0000 [drp] | OPHTHALMIC | Status: DC | PRN
  Filled 2015-04-03: qty 15

## 2015-04-03 MED ORDER — ONDANSETRON HCL 4 MG PO TABS: 4.0000 mg | ORAL_TABLET | Freq: Four times a day (QID) | ORAL | Status: DC | PRN

## 2015-04-03 MED ORDER — ARTIFICIAL TEARS OP OINT
TOPICAL_OINTMENT | Freq: Three times a day (TID) | OPHTHALMIC | Status: DC
  Administered 2015-04-03: 1 via OPHTHALMIC
  Administered 2015-04-05 – 2015-04-08 (×9): via OPHTHALMIC
  Filled 2015-04-03 (×2): qty 3.5

## 2015-04-03 MED ORDER — PIPERACILLIN-TAZOBACTAM 3.375 G IVPB 30 MIN
3.3750 g | Freq: Once | INTRAVENOUS | Status: AC
  Administered 2015-04-03: 3.375 g via INTRAVENOUS
  Filled 2015-04-03: qty 50

## 2015-04-03 NOTE — ED Notes (Addendum)
Pt c/o left sided chest pain and bug bite to left cheek onset Saturday. Pt also reports hyperglycemia onset Saturday CBG >600 at home. Pt presents with large amount of swelling to left cheek, denies shortness of breath.

## 2015-04-03 NOTE — H&P (Signed)
[facility] History and Physical  GHASSAN NILE Dougherty:[id number] DOB: 21-Jul-1975 DOA: 04/03/2015  Referring physician: Mr. Ebbie Ridge, PA, EDP PCP: Lupe Carney, MD  Specialists:   Chief Complaint: Facial swelling  HPI: Shane Dougherty is a 39 y.o. male  With a history of insulin-dependent diabetes mellitus, type II that presented to the emergency department with complaints of facial swelling. Patient states that he fell this past Friday or Saturday when he noticed 3 bumps on his face. He does not recall seeing insects that feels something was "stinging" getting him.  Patient states he had 2 small areas on the right face which have improved however are hard. He states that the swelling of his left cheek worsened today. He denies any pain on last touched. Denies any discharge. Patient currently denies any fever, chills, nausea, vomiting, diarrhea, abdominal pain, chest pain, shortness of breath. Patient does also admit to not taking insulin for the last couple of months as his prescription ran out and he does not have a primary care physician. In the emergency department, patient was noted to have a glucose level of 583.  Patient was not found to be in DKA. TRH was called for admission of facial cellulitis.  Review of Systems:  Constitutional: Complained of night sweats Saturday evening. Denies fever.Marland Kitchen  HEENT: Complains of swelling of the left cheek.  Respiratory: Denies SOB, DOE, cough, chest tightness,  and wheezing.   Cardiovascular: Denies chest pain, palpitations and leg swelling.  Gastrointestinal: Denies nausea, vomiting, abdominal pain, diarrhea, constipation, blood in stool and abdominal distention.  Genitourinary: Denies dysuria, urgency, frequency, hematuria, flank pain and difficulty urinating.  Musculoskeletal: Nerve damage in legs, recent fall. Skin: Denies pallor, rash and wound.  Neurological: Denies dizziness, seizures, syncope, weakness, light-headedness, numbness and  headaches.  Hematological: Denies adenopathy. Easy bruising, personal or family bleeding history  Psychiatric/Behavioral: Denies suicidal ideation, mood changes, confusion, nervousness, sleep disturbance and agitation  Past Medical History  Diagnosis Date  . Diabetes mellitus without complication    History reviewed. No pertinent past surgical history. Social History:  reports that he has been passively smoking.  He does not have any smokeless tobacco history on file. He reports that he does not drink alcohol or use illicit drugs.   Allergies  Allergen Reactions  . Bee Venom Swelling    Throat involvement   Family History  Mother died of heart disease at 18.  Several family members have diabetes.    Prior to Admission medications   Medication Sig Start Date End Date Taking? Authorizing Provider  artificial tears (LACRILUBE) OINT ophthalmic ointment Place into the right eye 3 (three) times daily. 08/20/14  Yes Glynn Octave, MD  insulin aspart (NOVOLOG) 100 UNIT/ML injection Your sliding scale with meals 08/20/14  Yes Glynn Octave, MD  insulin glargine (LANTUS) 100 UNIT/ML injection Inject 1-10 Units into the skin every morning. Sliding scale based on fasting BG   Yes Historical Provider, MD  Multiple Vitamins-Minerals (MULTIVITAMIN WITH MINERALS) tablet Take 1 tablet by mouth daily.   Yes Historical Provider, MD  polyvinyl alcohol (LIQUIFILM TEARS) 1.4 % ophthalmic solution Place 3-4 drops into both eyes as needed for dry eyes.   Yes Historical Provider, MD  cephALEXin (KEFLEX) 500 MG capsule Take 1 capsule (500 mg total) by mouth 4 (four) times daily. 11/25/14   Doug Sou, MD   Physical Exam: Filed Vitals:   04/03/15 1515  BP: 143/76  Pulse: 104  Temp:   Resp: 16  General: Well developed, well nourished, NAD, appears stated age  HEENT: NCAT, PERRLA, EOMI, Anicteic Sclera, mucous membranes moist.   Neck: Supple, no JVD, no masses  Cardiovascular: S1 S2  auscultated, no rubs, murmurs or gallops. Tachycardic  Respiratory: Clear to auscultation bilaterally with equal chest rise  Abdomen: Soft, nontender, nondistended, + bowel sounds  Extremities: warm dry without cyanosis clubbing or edema  Neuro: AAOx3, cranial nerves grossly intact. Strength 5/5 in patient's upper and lower extremities bilaterally  Skin: Patient does have marked erythema as well as edema of the left maxillary facial area, too small hard nodular areas on the right maxillofacial and mandibular areas  Psych: Normal affect and demeanor with intact judgement and insight  Labs on Admission:  Basic Metabolic Panel:  Recent Labs Lab 04/03/15 1055  NA 130*  K 3.7  CL 94*  CO2 28  GLUCOSE 583*  BUN 11  CREATININE 1.00  CALCIUM 8.5*   Liver Function Tests: No results for input(s): AST, ALT, ALKPHOS, BILITOT, PROT, ALBUMIN in the last 168 hours. No results for input(s): LIPASE, AMYLASE in the last 168 hours. No results for input(s): AMMONIA in the last 168 hours. CBC:  Recent Labs Lab 04/03/15 1055  WBC 10.9*  HGB 12.2*  HCT 37.0*  MCV 79.4  PLT 333   Cardiac Enzymes: No results for input(s): CKTOTAL, CKMB, CKMBINDEX, TROPONINI in the last 168 hours.  BNP (last 3 results) No results for input(s): BNP in the last 8760 hours.  ProBNP (last 3 results) No results for input(s): PROBNP in the last 8760 hours.  CBG: No results for input(s): GLUCAP in the last 168 hours.  Radiological Exams on Admission: Dg Chest 2 View  04/03/2015   CLINICAL DATA:  Left-sided chest pain.  EXAM: CHEST  2 VIEW  COMPARISON:  July 30, 2010.  FINDINGS: The heart size and mediastinal contours are within normal limits. Both lungs are clear. No pneumothorax or pleural effusion is noted. The visualized skeletal structures are unremarkable.  IMPRESSION: No active cardiopulmonary disease.   Electronically Signed   By: Lupita Raider, M.D.   On: 04/03/2015 12:01    EKG:  Independently reviewed. Sinus tachycardia, rate 111, first-degree AV block, PR 202  Assessment/Plan Sepsis secondary to facial cellulitis/abscess -Patient will be admitted to medical floor -Unknown cause, ?insect bite -Upon admission, patient did have leukocytosis with tachycardia -Patient does have marked erythema as well as edema of the left maxillary facial area, too small hard nodular areas on the right maxillofacial and mandibular areas. -Have asked EDP to obtain CT maxillofacial and ENT consult -Will start patient on vancomycin and Zosyn per pharmacy  Hyperglycemia, uncontrolled diabetes mellitus -Patient has not been taking his insulin as he ran out and has been trying to find PCP -Case management consulted for discharge planning -Blood glucose level upon admission 583, currently not in DKA. CO2 28,no gap -Will place on insulin sliding scale was CBG monitoring -Hemoglobin A1c ordered  Pseudohyponatremia secondary to hyperglycemia -Corrected sodium ~137 -Continue to monitor BMP  Microcytic Anemia -Hb currently 12.2 -Will obtain anemia panel  DVT prophylaxis: Lovenox  Code Status: Full  Condition: Guarded  Family Communication: None at bedside. Admission, patients condition and plan of care including tests being ordered have been discussed with the patient, who indicates understanding and agrees with the plan and Code Status.  Disposition Plan: Admitted.   Time spent: 65 minutes  MIKHAIL, MARYANN D.O. [facility] Pager [phone]  If 7PM-7AM, please contact night-coverage [email or web address] Password  [email or web address] 04/03/2015, 4:24 PM

## 2015-04-03 NOTE — Progress Notes (Signed)
ANTIBIOTIC CONSULT NOTE - INITIAL  Pharmacy Consult for vancomycin + zosyn Indication: cellulitis  Allergies  Allergen Reactions  . Bee Venom Swelling    Throat involvement    Patient Measurements: Height: 5\' 7"  (170.2 cm) Weight: 160 lb (72.576 kg) IBW/kg (Calculated) : 66.1 Adjusted Body Weight:   Vital Signs: Temp: 98.5 F (36.9 C) (09/26 1053) Temp Source: Oral (09/26 1053) BP: 143/76 mmHg (09/26 1515) Pulse Rate: 104 (09/26 1515) Intake/Output from previous day:   Intake/Output from this shift:    Labs:  Recent Labs  04/03/15 1055  WBC 10.9*  HGB 12.2*  PLT 333  CREATININE 1.00   Estimated Creatinine Clearance: 91.8 mL/min (by C-G formula based on Cr of 1). No results for input(s): VANCOTROUGH, VANCOPEAK, VANCORANDOM, GENTTROUGH, GENTPEAK, GENTRANDOM, TOBRATROUGH, TOBRAPEAK, TOBRARND, AMIKACINPEAK, AMIKACINTROU, AMIKACIN in the last 72 hours.   Microbiology: No results found for this or any previous visit (from the past 720 hour(s)).  Medical History: Past Medical History  Diagnosis Date  . Diabetes mellitus without complication     Medications:  Anti-infectives    Start     Dose/Rate Route Frequency Ordered Stop   04/04/15 0500  vancomycin (VANCOCIN) IVPB 1000 mg/200 mL premix     1,000 mg 200 mL/hr over 60 Minutes Intravenous Every 12 hours 04/03/15 1637     04/03/15 2300  piperacillin-tazobactam (ZOSYN) IVPB 3.375 g     3.375 g 12.5 mL/hr over 240 Minutes Intravenous Every 8 hours 04/03/15 1637     04/03/15 1630  vancomycin (VANCOCIN) 1,500 mg in sodium chloride 0.9 % 500 mL IVPB     1,500 mg 250 mL/hr over 120 Minutes Intravenous  Once 04/03/15 1626     04/03/15 1615  piperacillin-tazobactam (ZOSYN) IVPB 3.375 g     3.375 g 100 mL/hr over 30 Minutes Intravenous  Once 04/03/15 1606     04/03/15 1615  vancomycin (VANCOCIN) IVPB 1000 mg/200 mL premix  Status:  Discontinued     1,000 mg 200 mL/hr over 60 Minutes Intravenous  Once 04/03/15 1606  04/03/15 1626   04/03/15 1530  clindamycin (CLEOCIN) IVPB 900 mg  Status:  Discontinued     900 mg 100 mL/hr over 30 Minutes Intravenous  Once 04/03/15 1523 04/03/15 1606     Assessment: 39 yom presented to the ED with CP, bug bite to the L cheek and hyperglycemia. Pt is afebrile and WBC is mildly elevated at 10.9. Scr is 1. First doses ordered by MD.  Clement Husbands 9/26>> Zosyn 9/26>>  Goal of Therapy:  Vancomycin trough level 10-15 mcg/ml  Plan:  - Vanc 1500mg  IV x 1 then 1gm IV Q12H - Zosyn 3.375gm IV Q8H (4 hr inf) - F/u renal fxn, C&S, clinical status and trough at [facility] [place], Drake Leach 04/03/2015,4:43 PM

## 2015-04-03 NOTE — ED Provider Notes (Signed)
CSN: [id number]     Arrival date & time 04/03/15  1026 History   First MD Initiated Contact with Patient 04/03/15 1301     Chief Complaint  Patient presents with  . Chest Pain  . Hyperglycemia  . Insect Bite  . Facial Swelling     (Consider location/radiation/quality/duration/timing/severity/associated sxs/prior Treatment) HPI  Shane Dougherty is a 39 yo male with a history of insulin-dependent type 2 diabetes mellitus presenting to the ER with "insect bites". The patient reports that on Saturday fell into some pine straw and later in the day he noticed he had 3 bumps on his face. He did not see any insects, but reports feeling the stings. He reports his fall was due to nerve damage in his left leg from an injury that happened at work. The bumps did not itch but the one on his left cheek is swollen, red and hot. It is painful on palpation. He denies fever, chills, myalgias, nausea, vomiting, diarrhea, constipation, abdominal pain. Endorses night sweats, states he sweats through his cotton t-shirt. He states that over the past few days he has had  Moderate chest pressure that feels like tightening across his chest. Of note, he has not taken insulin   Past Medical History  Diagnosis Date  . Diabetes mellitus without complication    History reviewed. No pertinent past surgical history. No family history on file. Social History  Substance Use Topics  . Smoking status: Passive Smoke Exposure - Never Smoker  . Smokeless tobacco: None  . Alcohol Use: No    Review of Systems All other systems negative except as documented in the HPI. All pertinent positives and negatives as reviewed in the HPI.   Allergies  Bee venom  Home Medications   Prior to Admission medications   Medication Sig Start Date End Date Taking? Authorizing Provider  artificial tears (LACRILUBE) OINT ophthalmic ointment Place into the right eye 3 (three) times daily. 08/20/14  Yes Glynn Octave, MD  insulin aspart  (NOVOLOG) 100 UNIT/ML injection Your sliding scale with meals 08/20/14  Yes Glynn Octave, MD  insulin glargine (LANTUS) 100 UNIT/ML injection Inject 1-10 Units into the skin every morning. Sliding scale based on fasting BG   Yes Historical Provider, MD  Multiple Vitamins-Minerals (MULTIVITAMIN WITH MINERALS) tablet Take 1 tablet by mouth daily.   Yes Historical Provider, MD  polyvinyl alcohol (LIQUIFILM TEARS) 1.4 % ophthalmic solution Place 3-4 drops into both eyes as needed for dry eyes.   Yes Historical Provider, MD  cephALEXin (KEFLEX) 500 MG capsule Take 1 capsule (500 mg total) by mouth 4 (four) times daily. 11/25/14   Doug Sou, MD   BP 153/95 mmHg  Pulse 110  Temp(Src) 98.5 F (36.9 C) (Oral)  Resp 17  Ht  (1.702 m)  Wt 160 lb (72.576 kg)  BMI 25.05 kg/m2  SpO2 99% Physical Exam  Constitutional: He is oriented to person, place, and time. He appears well-developed and well-nourished. No distress.  HENT:  Head:    Eyes: Pupils are equal, round, and reactive to light.  Neck: Normal range of motion. Neck supple.  Cardiovascular: Normal rate, regular rhythm and normal heart sounds.  Exam reveals no gallop and no friction rub.   No murmur heard. Pulmonary/Chest: Effort normal and breath sounds normal. No respiratory distress. He has no wheezes.  Abdominal: Soft. Bowel sounds are normal. He exhibits no distension. There is no tenderness.  Neurological: He is alert and oriented to person, place, and time. He  exhibits normal muscle tone. Coordination normal.  Skin: Skin is warm and dry. No rash noted. No erythema.  Psychiatric: He has a normal mood and affect. His behavior is normal.  Nursing note and vitals reviewed.   ED Course  Procedures (including critical care time) Labs Review Labs Reviewed  BASIC METABOLIC PANEL - Abnormal; Notable for the following:    Sodium 130 (*)    Chloride 94 (*)    Glucose, Bld 583 (*)    Calcium 8.5 (*)    All other components  within normal limits  CBC - Abnormal; Notable for the following:    WBC 10.9 (*)    Hemoglobin 12.2 (*)    HCT 37.0 (*)    All other components within normal limits  Rosezena Sensor, ED    Imaging Review Dg Chest 2 View  04/03/2015   CLINICAL DATA:  Left-sided chest pain.  EXAM: CHEST  2 VIEW  COMPARISON:  July 30, 2010.  FINDINGS: The heart size and mediastinal contours are within normal limits. Both lungs are clear. No pneumothorax or pleural effusion is noted. The visualized skeletal structures are unremarkable.  IMPRESSION: No active cardiopulmonary disease.   Electronically Signed   By: Lupita Raider, M.D.   On: 04/03/2015 12:01   I have personally reviewed and evaluated these images and lab results as part of my medical decision-making.  Patient will be admitted to the hospital for further evaluation and care.  I also spoke with ENT, about the patient and they will see the patient in consultation   Charlestine Night, PA-C 04/04/15 1343  Elwin Mocha, MD 04/05/15 [phone]

## 2015-04-03 NOTE — Progress Notes (Signed)
Arrival Method: via stretcher Mental Status: alert and oriented x 4 Telemetry: N/A Tubes: N/A IV: RAC, infusing Pain: Denies Family: None present Living Situation: Home with brother  Safety Measures: call bell in reach, non skid socks on,  6E Orientation: oriented to staff and unit

## 2015-04-03 NOTE — ED Notes (Signed)
Lab called with glucose of 583.

## 2015-04-03 NOTE — ED Notes (Signed)
CBG: 305 

## 2015-04-04 LAB — COMPREHENSIVE METABOLIC PANEL
ALBUMIN: 1.6 g/dL — AB (ref 3.5–5.0)
ALT: 5 U/L — ABNORMAL LOW (ref 17–63)
ANION GAP: 7 (ref 5–15)
AST: 17 U/L (ref 15–41)
Alkaline Phosphatase: 167 U/L — ABNORMAL HIGH (ref 38–126)
BUN: 9 mg/dL (ref 6–20)
CHLORIDE: 99 mmol/L — AB (ref 101–111)
CO2: 26 mmol/L (ref 22–32)
Calcium: 7.7 mg/dL — ABNORMAL LOW (ref 8.9–10.3)
Creatinine, Ser: 0.95 mg/dL (ref 0.61–1.24)
GFR calc Af Amer: >60 mL/min (ref >60)
GFR calc non Af Amer: >60 mL/min (ref >60)
GLUCOSE: 288 mg/dL — AB (ref 65–99)
POTASSIUM: 3.3 mmol/L — AB (ref 3.5–5.1)
SODIUM: 132 mmol/L — AB (ref 135–145)
TOTAL PROTEIN: 5.5 g/dL — AB (ref 6.5–8.1)
Total Bilirubin: 0.4 mg/dL (ref 0.3–1.2)

## 2015-04-04 LAB — CBC
HCT: 32.7 % — ABNORMAL LOW (ref 39.0–52.0)
Hemoglobin: 10.9 g/dL — ABNORMAL LOW (ref 13.0–17.0)
MCH: 26.3 pg (ref 26.0–34.0)
MCHC: 33.3 g/dL (ref 30.0–36.0)
MCV: 79 fL (ref 78.0–100.0)
PLATELETS: 307 10*3/uL (ref 150–400)
RBC: 4.14 MIL/uL — ABNORMAL LOW (ref 4.22–5.81)
RDW: 13.8 % (ref 11.5–15.5)
WBC: 10.1 10*3/uL (ref 4.0–10.5)

## 2015-04-04 LAB — RETICULOCYTES
RBC.: 4.14 MIL/uL — AB (ref 4.22–5.81)
Retic Count, Absolute: 70.4 10*3/uL (ref 19.0–186.0)
Retic Ct Pct: 1.7 % (ref 0.4–3.1)

## 2015-04-04 LAB — GLUCOSE, CAPILLARY
GLUCOSE-CAPILLARY: 270 mg/dL — AB (ref 65–99)
GLUCOSE-CAPILLARY: 236 mg/dL — AB (ref 65–99)
Glucose-Capillary: 327 mg/dL — ABNORMAL HIGH (ref 65–99)
Glucose-Capillary: 245 mg/dL — ABNORMAL HIGH (ref 65–99)

## 2015-04-04 LAB — VITAMIN B12: VITAMIN B 12: 354 pg/mL (ref 180–914)

## 2015-04-04 LAB — FOLATE: FOLATE: 11.9 ng/mL (ref >5.9)

## 2015-04-04 LAB — HEMOGLOBIN A1C
Hgb A1c MFr Bld: 16.8 % — ABNORMAL HIGH (ref 4.8–5.6)
Mean Plasma Glucose: 435 mg/dL

## 2015-04-04 LAB — IRON AND TIBC
IRON: 13 ug/dL — AB (ref 45–182)
SATURATION RATIOS: 9 % — AB (ref 17.9–39.5)
TIBC: 139 ug/dL — AB (ref 250–450)
UIBC: 126 ug/dL

## 2015-04-04 LAB — FERRITIN: Ferritin: 492 ng/mL — ABNORMAL HIGH (ref 24–336)

## 2015-04-04 MED ORDER — MENTHOL 3 MG MT LOZG
1.0000 | LOZENGE | OROMUCOSAL | Status: DC | PRN
  Filled 2015-04-04: qty 9

## 2015-04-04 MED ORDER — POTASSIUM CHLORIDE CRYS ER 20 MEQ PO TBCR
40.0000 meq | EXTENDED_RELEASE_TABLET | Freq: Once | ORAL | Status: AC
  Administered 2015-04-04: 40 meq via ORAL
  Filled 2015-04-04: qty 2

## 2015-04-04 MED ORDER — INSULIN GLARGINE 100 UNIT/ML ~~LOC~~ SOLN
10.0000 [IU] | Freq: Every day | SUBCUTANEOUS | Status: DC
  Administered 2015-04-04: 10 [IU] via SUBCUTANEOUS
  Filled 2015-04-04 (×2): qty 0.1

## 2015-04-04 MED ORDER — INSULIN GLARGINE 100 UNIT/ML ~~LOC~~ SOLN
6.0000 [IU] | Freq: Every day | SUBCUTANEOUS | Status: DC
  Administered 2015-04-04: 6 [IU] via SUBCUTANEOUS
  Filled 2015-04-04: qty 0.06

## 2015-04-04 NOTE — Progress Notes (Addendum)
[facility] Progress Note   Shane Dougherty  ZOX:[id number]  DOB: 1976-03-30  DOA: 04/03/2015 PCP: Lupe Carney, MD  Brief narrative: Shane Dougherty is a 39 y.o. male with diabetes mellitus who has not been on insulin for number of months. The patient presents with acute onset of swelling of the left side of his face. He was admitted for facial cellulitis.  Subjective: States that facial swelling began to extend to around his left eye this morning to a point where he could not open his eye. It has improved after placing an ice pack on the area. No complaint of fever or chills. No postnasal drip or sore throat. No complaint of cavities that he is aware of.  Assessment/Plan: Principal Problem:   Left sided Facial cellulitis -Agree with vancomycin and Zosyn which I will continue-CT scan does not reveal an underlying abscess or bony involvement  Active Problems:   Hyperglycemia due to type 2 diabetes mellitus -Hemoglobin A1c significantly elevated at 16.8 -Previously was taking 10 units of Lantus a day along with a NovoLog sliding scale but has been out of his medications for a number of months -Have resumed Lantus and NovoLog- titrate doses as needed    Hyponatremia -Relation to hyperglycemia and possibly also dehydration -Agree with normal saline which I will continue at 75 mL an hour  Anemia of chronic disease -Anemia panel obtained-  consistent with anemia of chronic disease    Code Status:     Code Status Orders        Start     Ordered   04/03/15 1748  Full code   Continuous     04/03/15 1747     Family Communication:  Disposition Plan: We'll need appointment with PCP and possibly assistance with medications as has no insurance-I will ask for a case management consult DVT prophylaxis: Lovenox  Antibiotics: Anti-infectives    Start     Dose/Rate Route Frequency Ordered Stop   04/04/15 0500  vancomycin (VANCOCIN) IVPB 1000 mg/200 mL premix     1,000  mg 200 mL/hr over 60 Minutes Intravenous Every 12 hours 04/03/15 1637     04/03/15 2300  piperacillin-tazobactam (ZOSYN) IVPB 3.375 g     3.375 g 12.5 mL/hr over 240 Minutes Intravenous Every 8 hours 04/03/15 1637     04/03/15 1630  vancomycin (VANCOCIN) 1,500 mg in sodium chloride 0.9 % 500 mL IVPB     1,500 mg 250 mL/hr over 120 Minutes Intravenous  Once 04/03/15 1626 04/03/15 2016   04/03/15 1615  piperacillin-tazobactam (ZOSYN) IVPB 3.375 g     3.375 g 100 mL/hr over 30 Minutes Intravenous  Once 04/03/15 1606 04/03/15 1729   04/03/15 1615  vancomycin (VANCOCIN) IVPB 1000 mg/200 mL premix  Status:  Discontinued     1,000 mg 200 mL/hr over 60 Minutes Intravenous  Once 04/03/15 1606 04/03/15 1626   04/03/15 1530  clindamycin (CLEOCIN) IVPB 900 mg  Status:  Discontinued     900 mg 100 mL/hr over 30 Minutes Intravenous  Once 04/03/15 1523 04/03/15 1606      Objective: Filed Weights   04/03/15 1053 04/03/15 2128  Weight: 72.576 kg (160 lb) 68.176 kg (150 lb 4.8 oz)    Intake/Output Summary (Last 24 hours) at 04/04/15 1412 Last data filed at 04/04/15 0600  Gross per 24 hour  Intake 1453.75 ml  Output   1075 ml  Net 378.75 ml     Vitals Filed Vitals:   04/03/15 1746 04/03/15  2128 04/04/15 0453 04/04/15 0752  BP: 158/89 134/65 145/80 150/88  Pulse: 100 105 102 93  Temp: 99.1 F (37.3 C) 99.7 F (37.6 C) 98.3 F (36.8 C) 99 F (37.2 C)  TempSrc: Oral Oral Oral Oral  Resp: 17 17 16 16   Height:  5\' 7"  (1.702 m)    Weight:  68.176 kg (150 lb 4.8 oz)    SpO2: 95% 96% 98% 98%    Exam:  General:  Pt is alert, not in acute distress  HEENT: Extensive edema of left periorbital area and cheek with erythema, tenderness and induration, No icterus, No thrush, oral mucosa moist  Cardiovascular: regular rate and rhythm, S1/S2 No murmur  Respiratory: clear to auscultation bilaterally   Abdomen: Soft, +Bowel sounds, non tender, non distended, no guarding  MSK: No LE edema,  cyanosis or clubbing  Data Reviewed: Basic Metabolic Panel:  Recent Labs Lab 04/03/15 1055 04/04/15 0458  NA 130* 132*  K 3.7 3.3*  CL 94* 99*  CO2 28 26  GLUCOSE 583* 288*  BUN 11 9  CREATININE 1.00 0.95  CALCIUM 8.5* 7.7*   Liver Function Tests:  Recent Labs Lab 04/04/15 0458  AST 17  ALT 5*  ALKPHOS 167*  BILITOT 0.4  PROT 5.5*  ALBUMIN 1.6*   No results for input(s): LIPASE, AMYLASE in the last 168 hours. No results for input(s): AMMONIA in the last 168 hours. CBC:  Recent Labs Lab 04/03/15 1055 04/04/15 0458  WBC 10.9* 10.1  HGB 12.2* 10.9*  HCT 37.0* 32.7*  MCV 79.4 79.0  PLT 333 307   Cardiac Enzymes: No results for input(s): CKTOTAL, CKMB, CKMBINDEX, TROPONINI in the last 168 hours. BNP (last 3 results) No results for input(s): BNP in the last 8760 hours.  ProBNP (last 3 results) No results for input(s): PROBNP in the last 8760 hours.  CBG:  Recent Labs Lab 04/03/15 1706 04/03/15 2112 04/04/15 0752 04/04/15 1119  GLUCAP 305* 232* 327* 270*    No results found for this or any previous visit (from the past 240 hour(s)).   Studies: Dg Chest 2 View  04/03/2015   CLINICAL DATA:  Left-sided chest pain.  EXAM: CHEST  2 VIEW  COMPARISON:  July 30, 2010.  FINDINGS: The heart size and mediastinal contours are within normal limits. Both lungs are clear. No pneumothorax or pleural effusion is noted. The visualized skeletal structures are unremarkable.  IMPRESSION: No active cardiopulmonary disease.   Electronically Signed   By: Lupita Raider, M.D.   On: 04/03/2015 12:01   Ct Maxillofacial W/cm  04/03/2015   CLINICAL DATA:  Left-sided facial and cheek soft tissue swelling. Cellulitis. Evaluate for abscess.  EXAM: CT MAXILLOFACIAL WITH CONTRAST  TECHNIQUE: Multidetector CT imaging of the maxillofacial structures was performed with intravenous contrast. Multiplanar CT image reconstructions were also generated. A small metallic BB was placed on the  right temple in order to reliably differentiate right from left.  CONTRAST:  68mL OMNIPAQUE IOHEXOL 300 MG/ML  SOLN  COMPARISON:  None.  FINDINGS: Asymmetric soft tissue swelling is seen involving the left maxillary and mandibular subcutaneous tissues. No soft tissue abscess identified.  Mild submandibular lymphadenopathy is seen measuring up to 12 mm on image 15 of series 4. Bilateral submental lymphadenopathy is also seen measuring up to 14 mm. Small less than 1 cm right submandibular lymph nodes noted.  Globes and intraorbital anatomy are normal in appearance. No orbital or facial bone fracture or other bone lesions identified. Paranasal sinuses  are clear.  IMPRESSION: Left maxillary and mandibular soft tissue swelling. No evidence of abscess.  Mild left submandibular and bilateral submental lymphadenopathy, likely reactive in etiology.   Electronically Signed   By: Myles Rosenthal M.D.   On: 04/03/2015 17:01    Scheduled Meds:  Scheduled Meds: . artificial tears   Right Eye TID  . enoxaparin (LOVENOX) injection  40 mg Subcutaneous Q24H  . insulin aspart  0-15 Units Subcutaneous TID WC  . insulin glargine  6 Units Subcutaneous Daily  . piperacillin-tazobactam (ZOSYN)  IV  3.375 g Intravenous Q8H  . vancomycin  1,000 mg Intravenous Q12H   Continuous Infusions: . sodium chloride 75 mL/hr at 04/03/15 2330    Time spent on care of this patient: 35 min   RIZWAN,SAIMA, MD 04/04/2015, 2:12 PM  LOS: 1 day   [facility] Office  [phone] Pager - Text Page per [email or web address] If 7PM-7AM, please contact night-coverage [email or web address]

## 2015-04-04 NOTE — Progress Notes (Signed)
NT informed this RN of pt's vitals- BP 163/121, HR 114, Temp 99.1, 99% on RA. Pt c/o of pounding headache. DrButler Denmarkizwan notified via text page. Will await orders and continue to monitor.   Carrie Mew, RN

## 2015-04-04 NOTE — Care Management Note (Signed)
Case Management Note  Patient Details  Name: Shane Dougherty MRN: [id number] Date of Birth: 02-Sep-1975  Subjective/Objective:                 CM following for progression and d/c planning.   Action/Plan: Appoint will be scheduled for this pt for followup post d/c.   Expected Discharge Date:                  Expected Discharge Plan:  Home/Self Care  In-House Referral:  NA  Discharge planning Services  CM Consult, Indigent [facility] Clinic  Post Acute Care Choice:  NA Choice offered to:  NA  DME Arranged:    DME Agency:  NA  HH Arranged:  NA HH Agency:     Status of Service:  Completed, signed off  Medicare Important Message Given:    Date Medicare IM Given:    Medicare IM give by:    Date Additional Medicare IM Given:    Additional Medicare Important Message give by:     If discussed at Long Length of Stay Meetings, dates discussed:    Additional Comments:  Starlyn Skeans, RN 04/04/2015, 4:50 PM

## 2015-04-04 NOTE — Progress Notes (Signed)
Pt's facial swelling is getting worse, now extending to his left eye. Pt states he is not in pain, but he is having some difficulty chewing food. Dr. Butler Denmark notified via text page. Will continue to monitor.   Carrie Mew, RN

## 2015-04-05 ENCOUNTER — Inpatient Hospital Stay (HOSPITAL_COMMUNITY): Payer: 59

## 2015-04-05 DIAGNOSIS — L03211 Cellulitis of face: Principal | ICD-10-CM

## 2015-04-05 DIAGNOSIS — E1165 Type 2 diabetes mellitus with hyperglycemia: Secondary | ICD-10-CM

## 2015-04-05 DIAGNOSIS — L0201 Cutaneous abscess of face: Secondary | ICD-10-CM

## 2015-04-05 DIAGNOSIS — E871 Hypo-osmolality and hyponatremia: Secondary | ICD-10-CM

## 2015-04-05 LAB — GLUCOSE, CAPILLARY
GLUCOSE-CAPILLARY: 267 mg/dL — AB (ref 65–99)
GLUCOSE-CAPILLARY: 151 mg/dL — AB (ref 65–99)
Glucose-Capillary: 275 mg/dL — ABNORMAL HIGH (ref 65–99)
Glucose-Capillary: 204 mg/dL — ABNORMAL HIGH (ref 65–99)

## 2015-04-05 LAB — BASIC METABOLIC PANEL
Anion gap: 9 (ref 5–15)
BUN: 8 mg/dL (ref 6–20)
CHLORIDE: 98 mmol/L — AB (ref 101–111)
CO2: 27 mmol/L (ref 22–32)
CREATININE: 0.98 mg/dL (ref 0.61–1.24)
Calcium: 8.2 mg/dL — ABNORMAL LOW (ref 8.9–10.3)
GFR calc Af Amer: >60 mL/min (ref >60)
GFR calc non Af Amer: >60 mL/min (ref >60)
GLUCOSE: 292 mg/dL — AB (ref 65–99)
POTASSIUM: 3.6 mmol/L (ref 3.5–5.1)
SODIUM: 134 mmol/L — AB (ref 135–145)

## 2015-04-05 LAB — VANCOMYCIN, TROUGH: Vancomycin Tr: 15 ug/mL (ref 10.0–20.0)

## 2015-04-05 MED ORDER — BACITRACIN-NEOMYCIN-POLYMYXIN OINTMENT TUBE
TOPICAL_OINTMENT | Freq: Three times a day (TID) | CUTANEOUS | Status: DC
  Administered 2015-04-05 – 2015-04-08 (×9): via TOPICAL
  Filled 2015-04-05: qty 15

## 2015-04-05 MED ORDER — INSULIN GLARGINE 100 UNIT/ML ~~LOC~~ SOLN
15.0000 [IU] | Freq: Every day | SUBCUTANEOUS | Status: DC
  Administered 2015-04-05: 15 [IU] via SUBCUTANEOUS
  Filled 2015-04-05 (×2): qty 0.15

## 2015-04-05 MED ORDER — INSULIN ASPART 100 UNIT/ML ~~LOC~~ SOLN
4.0000 [IU] | Freq: Three times a day (TID) | SUBCUTANEOUS | Status: DC
  Administered 2015-04-05 – 2015-04-06 (×3): 4 [IU] via SUBCUTANEOUS

## 2015-04-05 MED ORDER — PNEUMOCOCCAL VAC POLYVALENT 25 MCG/0.5ML IJ INJ
0.5000 mL | INJECTION | INTRAMUSCULAR | Status: AC
  Administered 2015-04-06: 0.5 mL via INTRAMUSCULAR
  Filled 2015-04-05: qty 0.5

## 2015-04-05 MED ORDER — LIVING WELL WITH DIABETES BOOK
Freq: Once | Status: AC
  Administered 2015-04-05: 17:00:00
  Filled 2015-04-05: qty 1

## 2015-04-05 NOTE — Consult Note (Signed)
Dougherty, Shane [id number] 31-Dec-1975 Cathren Harsh, MD  Reason for Consult: left facial cellulitis  HPI: 39yo with several days of left facial swelling. Had CT scan 04/03/15 that did not show any abscess, just diffuse left facial and cheek edema. ENT consulted for this. Patient thinks he may have been bitten, thinks the swelling has improved somewhat while he has been in the hospital and left eye swelling is improved. Poorly controlled diabetes.  Allergies:  Allergies  Allergen Reactions  . Bee Venom Swelling    Throat involvement    ROS: Review of systems + for left facial swelling, otherwise negative x 12 systems except per HPI.  PMH:  Past Medical History  Diagnosis Date  . Diabetes mellitus without complication     FH:  Family History  Problem Relation Age of Onset  . CAD Mother     SH:  Social History   Social History  . Marital Status: Single    Spouse Name: N/A  . Number of Children: N/A  . Years of Education: N/A   Occupational History  . Not on file.   Social History Main Topics  . Smoking status: Passive Smoke Exposure - Never Smoker  . Smokeless tobacco: Not on file  . Alcohol Use: No  . Drug Use: No  . Sexual Activity: Not on file   Other Topics Concern  . Not on file   Social History Narrative    PSH: History reviewed. No pertinent past surgical history.  Physical  Exam: CN 2-12 grossly intact and symmetric. Facial nerve intact and symmetric. EAC/TMs normal BL. Oral cavity, lips, gums, ororpharynx normal with no masses or lesions. There is firm, tender edema and cellulitis of the left cheek/malar area and the lower 1/3 of the left face caudal to the left eye. Nasal cavity without polyps or purulence. External nose and ears without masses or lesions. EOMI, PERRLA. Neck supple.    A/P: left facial cellulitis without abscess. Would recommend continuing the IV vancomycin and Zosyn until clinically improved. Might want to consider swabbing the left  facial cellulitis for culture to adjust the antibiotics. Once he is clinically improved, which will take several days given the extent of the soft tissue infection, can switch to oral antibiotic such as doxycycline or Bactrim for total of 3 weeks. The edema would improve more quickly with a decadron taper but his diabetes may preclude treatment with IV steroids. Follow up as needed.   Melvenia Beam 04/05/2015 5:47 PM

## 2015-04-05 NOTE — Progress Notes (Signed)
ANTIBIOTIC CONSULT NOTE - FOLLOW UP  Pharmacy Consult for Vancomycin and Zosyn Indication: facial cellulitis  Allergies  Allergen Reactions  . Bee Venom Swelling    Throat involvement    Patient Measurements: Height: 5\' 7"  (170.2 cm) Weight: 149 lb 4.8 oz (67.722 kg) IBW/kg (Calculated) : 66.1  Vital Signs: Temp: 98.5 F (36.9 C) (09/28 1703) Temp Source: Oral (09/28 1703) BP: 165/80 mmHg (09/28 1703) Pulse Rate: 95 (09/28 1703)  Labs:  Recent Labs  04/03/15 1055 04/04/15 0458 04/05/15 0634  WBC 10.9* 10.1  --   HGB 12.2* 10.9*  --   PLT 333 307  --   CREATININE 1.00 0.95 0.98   Estimated Creatinine Clearance: 93.7 mL/min (by C-G formula based on Cr of 0.98).  Recent Labs  04/05/15 1605  VANCOTROUGH 15    Assessment:  Day # 3 Vancomycin and Zosyn for facial cellulitis without abscess. No culture data.  Evaluated by ENT this afternoon.  To continue IV antibiotics until clinically improved, then switch to oral antibiotics for a total of 3 weeks of therapy.   Vancomycin trough level = 15 mcg/ml, prior to 4th maintenance dose. Appropriate.  Goal of Therapy:  Vancomycin trough level 10-15 mcg/ml appropriate Zosyn dose for renal function and infection  Plan:   Continue Vancomycin 1 gram IV q12hrs.  Continue Zosyn 3.375 gm IV q8hrs (each over 4 hrs).  Follow renal function, progress, and transition to oral antibiotics when clinically improved.  Dennie Fetters, [place] Pager: [phone] 04/05/2015,6:33 PM

## 2015-04-05 NOTE — Progress Notes (Signed)
Inpatient Diabetes Program Recommendations  AACE/ADA: New Consensus Statement on Inpatient Glycemic Control (2015)  Target Ranges:  Prepandial:   less than 140 mg/dL      Peak postprandial:   less than 180 mg/dL (1-2 hours)      Critically ill patients:  140 - 180 mg/dL   Review of Glycemic Control  Diabetes history: DM2 Outpatient Diabetes medications: None, previously on Lantus 10-15 units and Novolog s/s Current orders for Inpatient glycemic control: Lantus 15 units QHS, Novolog moderate tidwc + 4 units tidwc  39 year old male admitted with facial cellulitis and hyperglycemia. Has been out of meds x 1.5 months. States he was able to maintain blood sugars below 200 with diet and exercise, until he had bug bite on face.  Results for ALIK, HEINKE (MRN [id number]) as of 04/05/2015 17:04  Ref. Range 04/04/2015 16:22 04/04/2015 22:03 04/05/2015 08:07 04/05/2015 11:18 04/05/2015 16:43  Glucose-Capillary Latest Ref Range: 65-99 mg/dL 865 (H) 784 (H) 696 (H) 275 (H) 151 (H)   Results for KEYONTE, FAUCI (MRN [id number]) as of 04/05/2015 17:04  Ref. Range 04/03/2015 10:55  Hemoglobin A1C Latest Ref Range: 4.8-5.6 % 16.8 (H)    Inpatient Diabetes Program Recommendations:    Titrate Lantus if FBS > 180 mg/dL. Ordered Living Well With DM book. To f/u with [facility] on October 5.  Thank you. Ailene Ards, RD, LDN, CDE Inpatient Diabetes Coordinator [phone]

## 2015-04-05 NOTE — Care Management Note (Signed)
Case Management Note  Patient Details  Name: Shane Dougherty MRN: [id number] Date of Birth: 04/19/1976  Subjective/Objective:         CM following for progression and d/c planning.           Action/Plan: 04/05/2015 Met with pt who wishes to have a followup appointment scheduled at the [facility] and will need [place] letter at the time of d/c. Appointment scheduled at [facility] for Wed, Apr 12, 2015 2 10:30 am. Will provide [place] letter at the time of d/c.  Expected Discharge Date:       04/07/2015           Expected Discharge Plan:  Home/Self Care  In-House Referral:  NA  Discharge planning Services  CM Consult, [facility]  Post Acute Care Choice:  NA Choice offered to:  NA  DME Arranged:    DME Agency:  NA  HH Arranged:  NA HH Agency:     Status of Service:  Completed, signed off  Medicare Important Message Given:    Date Medicare IM Given:    Medicare IM give by:    Date Additional Medicare IM Given:    Additional Medicare Important Message give by:     If discussed at [place] of Stay Meetings, dates discussed:    Additional Comments:  Adron Bene, RN 04/05/2015, 12:00 PM

## 2015-04-05 NOTE — Progress Notes (Signed)
[facility]                                                                              Patient Demographics  Shane Dougherty, is a 39 y.o. male, DOB - 08-04-1975, BJY:[id number]  Admit date - 04/03/2015   Admitting Physician Edsel Petrin, DO  Outpatient Primary MD for the patient is Lupe Carney, MD  LOS - 2   Chief Complaint  Patient presents with  . Chest Pain  . Hyperglycemia  . Insect Bite  . Facial Swelling       Brief HPI   Shane Dougherty is a 39 y.o. male with diabetes mellitus who has not been on insulin for number of months. The patient presents with acute onset of swelling of the left side of his face. He was admitted for facial cellulitis.   Assessment & Plan    Principal Problem:  Left sided Facial cellulitis-per patient worsening  -Continue IV vancomycin and Zosyn  - CT maxillofacial showed left maxillary and mandibular soft tissue swelling, no abscess. The patient has significant induration ~3-4cm on the left cheek, ENT consult called  - although, patient is not complaining of any dental pain, ordered panoramic dental x-ray to rule out any dental abscess  Active Problems:  Hyperglycemia due to type 2 diabetes mellitus; Uncontrolled -Hemoglobin A1c significantly elevated at 16.8 -Previously was taking 10 units of Lantus a day, NovoLog sliding scale but has been out of his medications for months - Increase Lantus to 15 units, added meal coverage, continue sliding scale insulin   Hyponatremia: Likely pseudo-hyponatremia due to hyperglycemia, CBG 583 at the time of admission, underlying dehydration. - resolved, DC IV fluids   Anemia of chronic disease -Anemia panel obtained- consistent with anemia of chronic disease   Code Status: Full code   Family Communication: Discussed in detail with the patient, all imaging results, lab results explained to the patientand family member in the room  Disposition Plan: Not medically ready    Time Spent in minutes   25 minutes  Procedures  CT maxillofacial  Consults   ENT  DVT Prophylaxis  Lovenox  Medications  Scheduled Meds: . artificial tears   Right Eye TID  . enoxaparin (LOVENOX) injection  40 mg Subcutaneous Q24H  . insulin aspart  0-15 Units Subcutaneous TID WC  . insulin aspart  4 Units Subcutaneous TID WC  . insulin glargine  15 Units Subcutaneous QHS  . piperacillin-tazobactam (ZOSYN)  IV  3.375 g Intravenous Q8H  . [START ON 04/06/2015] pneumococcal 23 valent vaccine  0.5 mL Intramuscular Tomorrow-1000  . vancomycin  1,000 mg Intravenous Q12H   Continuous Infusions: . sodium chloride 75 mL/hr at 04/05/15 0514   PRN Meds:.acetaminophen **OR** acetaminophen, menthol-cetylpyridinium, morphine injection, ondansetron **OR** ondansetron (ZOFRAN) IV, polyvinyl alcohol   Antibiotics   Anti-infectives    Start     Dose/Rate Route Frequency Ordered Stop   04/04/15 0500  vancomycin (VANCOCIN) IVPB 1000 mg/200 mL premix     1,000 mg 200 mL/hr over 60 Minutes Intravenous Every 12 hours 04/03/15 1637     04/03/15 2300  piperacillin-tazobactam (ZOSYN) IVPB 3.375 g  3.375 g 12.5 mL/hr over 240 Minutes Intravenous Every 8 hours 04/03/15 1637     04/03/15 1630  vancomycin (VANCOCIN) 1,500 mg in sodium chloride 0.9 % 500 mL IVPB     1,500 mg 250 mL/hr over 120 Minutes Intravenous  Once 04/03/15 1626 04/03/15 2016   04/03/15 1615  piperacillin-tazobactam (ZOSYN) IVPB 3.375 g     3.375 g 100 mL/hr over 30 Minutes Intravenous  Once 04/03/15 1606 04/03/15 1729   04/03/15 1615  vancomycin (VANCOCIN) IVPB 1000 mg/200 mL premix  Status:  Discontinued     1,000 mg 200 mL/hr over 60 Minutes Intravenous  Once 04/03/15 1606 04/03/15 1626   04/03/15 1530  clindamycin (CLEOCIN) IVPB 900 mg  Status:  Discontinued     900 mg 100 mL/hr over 30 Minutes Intravenous  Once 04/03/15 1523 04/03/15 1606        Subjective:   [name] was seen and examined today. Feels  worse today, states that swelling is extending towards his left eye, yesterday could not open up his eye, better today. However has significant induration on the left cheek. No dental pain. No fevers or chills. Patient denies dizziness, chest pain, shortness of breath, abdominal pain, N/V/D/C, new weakness, numbess, tingling. No acute events overnight.    Objective:   Blood pressure 155/86, pulse 98, temperature 98.5 F (36.9 C), temperature source Oral, resp. rate 16, height 5\' 7"  (1.702 m), weight 67.722 kg (149 lb 4.8 oz), SpO2 98 %.  Wt Readings from Last 3 Encounters:  04/04/15 67.722 kg (149 lb 4.8 oz)  08/20/14 72.576 kg (160 lb)     Intake/Output Summary (Last 24 hours) at 04/05/15 1117 Last data filed at 04/05/15 0827  Gross per 24 hour  Intake   2750 ml  Output      0 ml  Net   2750 ml    Exam  General: Alert and oriented x 3, NAD  HEENT:  PERRLA, EOMI, Anicteric Sclera, mucous membranes moist. Extensive cellulitis, induration on the left periorbital area and left cheek, tenderness.   Neck: Supple, no JVD, no masses  CVS: S1 S2 auscultated, no rubs, murmurs or gallops. Regular rate and rhythm.  Respiratory: Clear to auscultation bilaterally, no wheezing, rales or rhonchi  Abdomen: Soft, nontender, nondistended, + bowel sounds  Ext: no cyanosis clubbing or edema  Neuro: AAOx3, Cr N's II- XII. Strength 5/5 upper and lower extremities bilaterally  Skin: No rashes  Psych: Normal affect and demeanor, alert and oriented x3    Data Review   Micro Results No results found for this or any previous visit (from the past 240 hour(s)).  Radiology Reports Dg Chest 2 View  04/03/2015   CLINICAL DATA:  Left-sided chest pain.  EXAM: CHEST  2 VIEW  COMPARISON:  July 30, 2010.  FINDINGS: The heart size and mediastinal contours are within normal limits. Both lungs are clear. No pneumothorax or pleural effusion is noted. The visualized skeletal structures are unremarkable.   IMPRESSION: No active cardiopulmonary disease.   Electronically Signed   By: Lupita Raider, M.D.   On: 04/03/2015 12:01   Ct Maxillofacial W/cm  04/03/2015   CLINICAL DATA:  Left-sided facial and cheek soft tissue swelling. Cellulitis. Evaluate for abscess.  EXAM: CT MAXILLOFACIAL WITH CONTRAST  TECHNIQUE: Multidetector CT imaging of the maxillofacial structures was performed with intravenous contrast. Multiplanar CT image reconstructions were also generated. A small metallic BB was placed on the right temple in order to reliably differentiate right  from left.  CONTRAST:  75mL OMNIPAQUE IOHEXOL 300 MG/ML  SOLN  COMPARISON:  None.  FINDINGS: Asymmetric soft tissue swelling is seen involving the left maxillary and mandibular subcutaneous tissues. No soft tissue abscess identified.  Mild submandibular lymphadenopathy is seen measuring up to 12 mm on image 15 of series 4. Bilateral submental lymphadenopathy is also seen measuring up to 14 mm. Small less than 1 cm right submandibular lymph nodes noted.  Globes and intraorbital anatomy are normal in appearance. No orbital or facial bone fracture or other bone lesions identified. Paranasal sinuses are clear.  IMPRESSION: Left maxillary and mandibular soft tissue swelling. No evidence of abscess.  Mild left submandibular and bilateral submental lymphadenopathy, likely reactive in etiology.   Electronically Signed   By: Myles Rosenthal M.D.   On: 04/03/2015 17:01    CBC  Recent Labs Lab 04/03/15 1055 04/04/15 0458  WBC 10.9* 10.1  HGB 12.2* 10.9*  HCT 37.0* 32.7*  PLT 333 307  MCV 79.4 79.0  MCH 26.2 26.3  MCHC 33.0 33.3  RDW 13.8 13.8    Chemistries   Recent Labs Lab 04/03/15 1055 04/04/15 0458 04/05/15 0634  NA 130* 132* 134*  K 3.7 3.3* 3.6  CL 94* 99* 98*  CO2 GLUCOSE 583* 288* 292*  BUN CREATININE 1.00 0.95 0.98  CALCIUM 8.5* 7.7* 8.2*  AST  --  17  --   ALT  --  5*  --   ALKPHOS  --  167*  --   BILITOT  --  0.4   --    ------------------------------------------------------------------------------------------------------------------ estimated creatinine clearance is 93.7 mL/min (by C-G formula based on Cr of 0.98). ------------------------------------------------------------------------------------------------------------------  Recent Labs  04/03/15 1055  HGBA1C 16.8*   ------------------------------------------------------------------------------------------------------------------ No results for input(s): CHOL, HDL, LDLCALC, TRIG, CHOLHDL, LDLDIRECT in the last 72 hours. ------------------------------------------------------------------------------------------------------------------ No results for input(s): TSH, T4TOTAL, T3FREE, THYROIDAB in the last 72 hours.  Invalid input(s): FREET3 ------------------------------------------------------------------------------------------------------------------  Recent Labs  04/04/15 0458  VITAMINB12 354  FOLATE 11.9  FERRITIN 492*  TIBC 139*  IRON 13*  RETICCTPCT 1.7    Coagulation profile No results for input(s): INR, PROTIME in the last 168 hours.  No results for input(s): DDIMER in the last 72 hours.  Cardiac Enzymes No results for input(s): CKMB, TROPONINI, MYOGLOBIN in the last 168 hours.  Invalid input(s): CK ------------------------------------------------------------------------------------------------------------------ Invalid input(s): POCBNP   Recent Labs  04/03/15 2112 04/04/15 0752 04/04/15 1119 04/04/15 1622 04/04/15 2203 04/05/15 0807  GLUCAP 232* 327* 270* 245* 236* 267*     Kristyna Bradstreet M.D. [facility] 04/05/2015, 11:17 AM  Pager: [phone] Between 7am to 7pm - call Pager - [phone]  After 7pm go to [email or web address] - password [email or web address]  Call night coverage person covering after 7pm

## 2015-04-06 DIAGNOSIS — I10 Essential (primary) hypertension: Secondary | ICD-10-CM | POA: Diagnosis present

## 2015-04-06 LAB — GLUCOSE, CAPILLARY
Glucose-Capillary: 267 mg/dL — ABNORMAL HIGH (ref 65–99)
Glucose-Capillary: 231 mg/dL — ABNORMAL HIGH (ref 65–99)
Glucose-Capillary: 124 mg/dL — ABNORMAL HIGH (ref 65–99)
Glucose-Capillary: 249 mg/dL — ABNORMAL HIGH (ref 65–99)

## 2015-04-06 MED ORDER — INSULIN ASPART 100 UNIT/ML ~~LOC~~ SOLN
5.0000 [IU] | Freq: Three times a day (TID) | SUBCUTANEOUS | Status: DC
  Administered 2015-04-06 – 2015-04-07 (×3): 5 [IU] via SUBCUTANEOUS

## 2015-04-06 MED ORDER — INSULIN GLARGINE 100 UNIT/ML ~~LOC~~ SOLN
20.0000 [IU] | Freq: Every day | SUBCUTANEOUS | Status: DC
  Administered 2015-04-06: 20 [IU] via SUBCUTANEOUS
  Filled 2015-04-06 (×2): qty 0.2

## 2015-04-06 MED ORDER — LOSARTAN POTASSIUM 50 MG PO TABS
50.0000 mg | ORAL_TABLET | Freq: Every day | ORAL | Status: DC
  Administered 2015-04-06 – 2015-04-07 (×2): 50 mg via ORAL
  Filled 2015-04-06 (×2): qty 1

## 2015-04-06 MED ORDER — INFLUENZA VAC SPLIT QUAD 0.5 ML IM SUSY
0.5000 mL | PREFILLED_SYRINGE | INTRAMUSCULAR | Status: AC
  Administered 2015-04-07: 0.5 mL via INTRAMUSCULAR
  Filled 2015-04-06 (×2): qty 0.5

## 2015-04-06 MED ORDER — AMLODIPINE BESYLATE 5 MG PO TABS
5.0000 mg | ORAL_TABLET | Freq: Every day | ORAL | Status: DC
  Administered 2015-04-06 – 2015-04-07 (×2): 5 mg via ORAL
  Filled 2015-04-06 (×2): qty 1

## 2015-04-06 NOTE — Progress Notes (Signed)
[facility]                                                                              Patient Demographics  Shane Dougherty, is a 39 y.o. male, DOB - 1975/08/01, LDK:[id number]  Admit date - 04/03/2015   Admitting Physician Edsel Petrin, DO  Outpatient Primary MD for the patient is Lupe Carney, MD  LOS - 3   Chief Complaint  Patient presents with  . Chest Pain  . Hyperglycemia  . Insect Bite  . Facial Swelling       Brief HPI   Shane Dougherty is a 39 y.o. male with diabetes mellitus who has not been on insulin for number of months. The patient presents with acute onset of swelling of the left side of his face. He was admitted for facial cellulitis.   Assessment & Plan    Principal Problem:  Left sided Facial cellulitis-per patient worsening  -Continue IV vancomycin and Zosyn  - CT maxillofacial showed left maxillary and mandibular soft tissue swelling, no abscess. The patient has significant induration ~3-4cm on the left cheek, -Dental x-ray negative for any abscess - Appreciate ENT recommendations, continue IV antibiotics for another 2 days, then will transition to oral antibiotics for 3 weeks   Active Problems:  Hyperglycemia due to type 2 diabetes mellitus; Uncontrolled -Hemoglobin A1c significantly elevated at 16.8 - Previously was taking 10 units of Lantus a day, NovoLog sliding scale but has been out of his medications for months Still very uncontrolled, increase Lantus to 20 units, meal coverage 5 unitsTID, sliding scale insulin   Hypertension: Uncontrolled - Placed on losartan 50 mg daily and Norvasc 5 mg daily. Patient will need titration of these medications outpatient by PCP    Hyponatremia: Likely pseudo-hyponatremia due to hyperglycemia, CBG 583 at the time of admission, underlying dehydration. - resolved, DC IV fluids   Anemia of chronic disease -Anemia panel obtained- consistent with anemia of chronic disease, Ferritin  elevated   Code Status: Full code   Family Communication: Discussed in detail with the patient, all imaging results, lab results explained to the patient brother at the bedside  Disposition Plan: Not medically ready   Time Spent in minutes   25 minutes  Procedures  CT maxillofacial  Consults   ENT  DVT Prophylaxis  Lovenox  Medications  Scheduled Meds: . artificial tears   Right Eye TID  . enoxaparin (LOVENOX) injection  40 mg Subcutaneous Q24H  . insulin aspart  0-15 Units Subcutaneous TID WC  . insulin aspart  4 Units Subcutaneous TID WC  . insulin glargine  15 Units Subcutaneous QHS  . neomycin-bacitracin-polymyxin   Topical TID  . piperacillin-tazobactam (ZOSYN)  IV  3.375 g Intravenous Q8H  . vancomycin  1,000 mg Intravenous Q12H   Continuous Infusions:   PRN Meds:.acetaminophen **OR** acetaminophen, menthol-cetylpyridinium, morphine injection, ondansetron **OR** ondansetron (ZOFRAN) IV, polyvinyl alcohol   Antibiotics   Anti-infectives    Start     Dose/Rate Route Frequency Ordered Stop   04/04/15 0500  vancomycin (VANCOCIN) IVPB 1000 mg/200 mL premix     1,000 mg 200 mL/hr over 60 Minutes  Intravenous Every 12 hours 04/03/15 1637     04/03/15 2300  piperacillin-tazobactam (ZOSYN) IVPB 3.375 g     3.375 g 12.5 mL/hr over 240 Minutes Intravenous Every 8 hours 04/03/15 1637     04/03/15 1630  vancomycin (VANCOCIN) 1,500 mg in sodium chloride 0.9 % 500 mL IVPB     1,500 mg 250 mL/hr over 120 Minutes Intravenous  Once 04/03/15 1626 04/03/15 2016   04/03/15 1615  piperacillin-tazobactam (ZOSYN) IVPB 3.375 g     3.375 g 100 mL/hr over 30 Minutes Intravenous  Once 04/03/15 1606 04/03/15 1729   04/03/15 1615  vancomycin (VANCOCIN) IVPB 1000 mg/200 mL premix  Status:  Discontinued     1,000 mg 200 mL/hr over 60 Minutes Intravenous  Once 04/03/15 1606 04/03/15 1626   04/03/15 1530  clindamycin (CLEOCIN) IVPB 900 mg  Status:  Discontinued     900 mg 100 mL/hr over 30  Minutes Intravenous  Once 04/03/15 1523 04/03/15 1606        Subjective:   Shane Dougherty was seen and examined today. still somewhat tender and swelling on the face, slightly better today  No fevers or chills. Patient denies dizziness, chest pain, shortness of breath, abdominal pain, N/V/D/C, new weakness, numbess, tingling. No acute events overnight.    Objective:   Blood pressure 154/74, pulse 91, temperature 98.1 F (36.7 C), temperature source Oral, resp. rate 18, height  (1.702 m), weight 67 kg (147 lb 11.3 oz), SpO2 98 %.  Wt Readings from Last 3 Encounters:  04/06/15 67 kg (147 lb 11.3 oz)  08/20/14 72.576 kg (160 lb)     Intake/Output Summary (Last 24 hours) at 04/06/15 1127 Last data filed at 04/06/15 1042  Gross per 24 hour  Intake   1030 ml  Output    800 ml  Net    230 ml    Exam  General: Alert and oriented x 3, NAD  HEENT:  PERRLA, EOMI, Anicteric Sclera, mucous membranes moist. Extensive cellulitis, induration on the left periorbital area and left cheek, tenderness, Only slightly improved from yesterday.   Neck: Supple, no JVD, no masses  CVS: S1 S2 clear, RRR  Respiratory: CTAB  Abdomen: Soft, nontender, nondistended, + bowel sounds  Ext: no cyanosis clubbing or edema  Neuro: no new deficits  skin: See HEENT exam   Psych: Normal affect and demeanor, alert and oriented x3    Data Review   Micro Results No results found for this or any previous visit (from the past 240 hour(s)).  Radiology Reports Dg Orthopantogram  04/05/2015   CLINICAL DATA:  Facial cellulitis.  EXAM: ORTHOPANTOGRAM/PANORAMIC  COMPARISON:  CT maxillofacial 04/03/2015.  FINDINGS: No significant periapical lucencies or dental caries. No focal abnormalities in the mandible or visualized maxilla. No sinus air-fluid level.  IMPRESSION: Negative.   Electronically Signed   By: Elsie Stain M.D.   On: 04/05/2015 12:09   Dg Chest 2 View  04/03/2015   CLINICAL DATA:   Left-sided chest pain.  EXAM: CHEST  2 VIEW  COMPARISON:  July 30, 2010.  FINDINGS: The heart size and mediastinal contours are within normal limits. Both lungs are clear. No pneumothorax or pleural effusion is noted. The visualized skeletal structures are unremarkable.  IMPRESSION: No active cardiopulmonary disease.   Electronically Signed   By: Lupita Raider, M.D.   On: 04/03/2015 12:01   Ct Maxillofacial W/cm  04/03/2015   CLINICAL DATA:  Left-sided facial and cheek soft tissue swelling. Cellulitis.  Evaluate for abscess.  EXAM: CT MAXILLOFACIAL WITH CONTRAST  TECHNIQUE: Multidetector CT imaging of the maxillofacial structures was performed with intravenous contrast. Multiplanar CT image reconstructions were also generated. A small metallic BB was placed on the right temple in order to reliably differentiate right from left.  CONTRAST:  75mL OMNIPAQUE IOHEXOL 300 MG/ML  SOLN  COMPARISON:  None.  FINDINGS: Asymmetric soft tissue swelling is seen involving the left maxillary and mandibular subcutaneous tissues. No soft tissue abscess identified.  Mild submandibular lymphadenopathy is seen measuring up to 12 mm on image 15 of series 4. Bilateral submental lymphadenopathy is also seen measuring up to 14 mm. Small less than 1 cm right submandibular lymph nodes noted.  Globes and intraorbital anatomy are normal in appearance. No orbital or facial bone fracture or other bone lesions identified. Paranasal sinuses are clear.  IMPRESSION: Left maxillary and mandibular soft tissue swelling. No evidence of abscess.  Mild left submandibular and bilateral submental lymphadenopathy, likely reactive in etiology.   Electronically Signed   By: Myles Rosenthal M.D.   On: 04/03/2015 17:01    CBC  Recent Labs Lab 04/03/15 1055 04/04/15 0458  WBC 10.9* 10.1  HGB 12.2* 10.9*  HCT 37.0* 32.7*  PLT 333 307  MCV 79.4 79.0  MCH 26.2 26.3  MCHC 33.0 33.3  RDW 13.8 13.8    Chemistries   Recent Labs Lab 04/03/15 1055  04/04/15 0458 04/05/15 0634  NA 130* 132* 134*  K 3.7 3.3* 3.6  CL 94* 99* 98*  CO2 GLUCOSE 583* 288* 292*  BUN CREATININE 1.00 0.95 0.98  CALCIUM 8.5* 7.7* 8.2*  AST  --  17  --   ALT  --  5*  --   ALKPHOS  --  167*  --   BILITOT  --  0.4  --    ------------------------------------------------------------------------------------------------------------------ estimated creatinine clearance is 93.7 mL/min (by C-G formula based on Cr of 0.98). ------------------------------------------------------------------------------------------------------------------ No results for input(s): HGBA1C in the last 72 hours. ------------------------------------------------------------------------------------------------------------------ No results for input(s): CHOL, HDL, LDLCALC, TRIG, CHOLHDL, LDLDIRECT in the last 72 hours. ------------------------------------------------------------------------------------------------------------------ No results for input(s): TSH, T4TOTAL, T3FREE, THYROIDAB in the last 72 hours.  Invalid input(s): FREET3 ------------------------------------------------------------------------------------------------------------------  Recent Labs  04/04/15 0458  VITAMINB12 354  FOLATE 11.9  FERRITIN 492*  TIBC 139*  IRON 13*  RETICCTPCT 1.7    Coagulation profile No results for input(s): INR, PROTIME in the last 168 hours.  No results for input(s): DDIMER in the last 72 hours.  Cardiac Enzymes No results for input(s): CKMB, TROPONINI, MYOGLOBIN in the last 168 hours.  Invalid input(s): CK ------------------------------------------------------------------------------------------------------------------ Invalid input(s): POCBNP   Recent Labs  04/04/15 2203 04/05/15 0807 04/05/15 1118 04/05/15 1643 04/05/15 2108 04/06/15 0818  GLUCAP 236* 267* 275* 151* 204* 267*     RAI,RIPUDEEP M.D. [facility] 04/06/2015, 11:27 AM  Pager:  [phone] Between 7am to 7pm - call Pager - [phone]  After 7pm go to [email or web address] - password [email or web address]  Call night coverage person covering after 7pm

## 2015-04-07 DIAGNOSIS — I10 Essential (primary) hypertension: Secondary | ICD-10-CM

## 2015-04-07 LAB — GLUCOSE, CAPILLARY
GLUCOSE-CAPILLARY: 87 mg/dL (ref 65–99)
GLUCOSE-CAPILLARY: 176 mg/dL — AB (ref 65–99)
GLUCOSE-CAPILLARY: 214 mg/dL — AB (ref 65–99)
Glucose-Capillary: 455 mg/dL — ABNORMAL HIGH (ref 65–99)

## 2015-04-07 LAB — CBC
HCT: 31.6 % — ABNORMAL LOW (ref 39.0–52.0)
HEMOGLOBIN: 10.1 g/dL — AB (ref 13.0–17.0)
MCH: 25.4 pg — AB (ref 26.0–34.0)
MCHC: 32 g/dL (ref 30.0–36.0)
MCV: 79.6 fL (ref 78.0–100.0)
Platelets: 357 10*3/uL (ref 150–400)
RBC: 3.97 MIL/uL — AB (ref 4.22–5.81)
RDW: 13.9 % (ref 11.5–15.5)
WBC: 8.2 10*3/uL (ref 4.0–10.5)

## 2015-04-07 LAB — BASIC METABOLIC PANEL
ANION GAP: 7 (ref 5–15)
BUN: 8 mg/dL (ref 6–20)
CHLORIDE: 100 mmol/L — AB (ref 101–111)
CO2: 28 mmol/L (ref 22–32)
Calcium: 7.7 mg/dL — ABNORMAL LOW (ref 8.9–10.3)
Creatinine, Ser: 1.59 mg/dL — ABNORMAL HIGH (ref 0.61–1.24)
GFR calc non Af Amer: 53 mL/min — ABNORMAL LOW (ref >60)
Glucose, Bld: 398 mg/dL — ABNORMAL HIGH (ref 65–99)
POTASSIUM: 3.3 mmol/L — AB (ref 3.5–5.1)
SODIUM: 135 mmol/L (ref 135–145)

## 2015-04-07 MED ORDER — INSULIN ASPART 100 UNIT/ML ~~LOC~~ SOLN
10.0000 [IU] | Freq: Three times a day (TID) | SUBCUTANEOUS | Status: DC
  Administered 2015-04-07 – 2015-04-08 (×3): 10 [IU] via SUBCUTANEOUS

## 2015-04-07 MED ORDER — LOSARTAN POTASSIUM 50 MG PO TABS: 100.0000 mg | ORAL_TABLET | Freq: Every day | ORAL | Status: DC

## 2015-04-07 MED ORDER — AMLODIPINE BESYLATE 5 MG PO TABS
5.0000 mg | ORAL_TABLET | Freq: Once | ORAL | Status: AC
  Administered 2015-04-07: 5 mg via ORAL
  Filled 2015-04-07: qty 1

## 2015-04-07 MED ORDER — INSULIN ASPART 100 UNIT/ML ~~LOC~~ SOLN
0.0000 [IU] | Freq: Three times a day (TID) | SUBCUTANEOUS | Status: DC
  Administered 2015-04-07: 4 [IU] via SUBCUTANEOUS
  Administered 2015-04-08: 15 [IU] via SUBCUTANEOUS
  Administered 2015-04-08: 4 [IU] via SUBCUTANEOUS

## 2015-04-07 MED ORDER — VANCOMYCIN HCL IN DEXTROSE 1-5 GM/200ML-% IV SOLN
1000.0000 mg | INTRAVENOUS | Status: DC
  Administered 2015-04-08: 1000 mg via INTRAVENOUS
  Filled 2015-04-07 (×2): qty 200

## 2015-04-07 MED ORDER — LOSARTAN POTASSIUM 50 MG PO TABS
50.0000 mg | ORAL_TABLET | Freq: Once | ORAL | Status: AC
  Administered 2015-04-07: 50 mg via ORAL
  Filled 2015-04-07: qty 1

## 2015-04-07 MED ORDER — POTASSIUM CHLORIDE CRYS ER 20 MEQ PO TBCR
40.0000 meq | EXTENDED_RELEASE_TABLET | Freq: Once | ORAL | Status: AC
  Administered 2015-04-07: 40 meq via ORAL
  Filled 2015-04-07: qty 2

## 2015-04-07 MED ORDER — AMLODIPINE BESYLATE 10 MG PO TABS
10.0000 mg | ORAL_TABLET | Freq: Every day | ORAL | Status: DC
  Administered 2015-04-08: 10 mg via ORAL
  Filled 2015-04-07: qty 1

## 2015-04-07 MED ORDER — INSULIN GLARGINE 100 UNIT/ML ~~LOC~~ SOLN
25.0000 [IU] | Freq: Every day | SUBCUTANEOUS | Status: DC
  Administered 2015-04-07: 25 [IU] via SUBCUTANEOUS
  Filled 2015-04-07 (×2): qty 0.25

## 2015-04-07 NOTE — Progress Notes (Signed)
Pt. CBG 455. MD notified. 15 units sliding scale plus 5 units meal coverage was admin, per MD.   Karsten Ro

## 2015-04-07 NOTE — Progress Notes (Signed)
[facility]                                                                              Patient Demographics  Shane Dougherty, is a 39 y.o. male, DOB - 06-16-1976, WUJ:[id number]  Admit date - 04/03/2015   Admitting Physician Edsel Petrin, DO  Outpatient Primary MD for the patient is Lupe Carney, MD  LOS - 4   Chief Complaint  Patient presents with  . Chest Pain  . Hyperglycemia  . Insect Bite  . Facial Swelling       Brief HPI   Shane Dougherty is a 39 y.o. male with diabetes mellitus who has not been on insulin for number of months. The patient presents with acute onset of swelling of the left side of his face. He was admitted for facial cellulitis.   Assessment & Plan    Principal Problem:  Left sided Facial cellulitis-per patient worsening  -Continue IV vancomycin and Zosyn  - CT maxillofacial showed left maxillary and mandibular soft tissue swelling, no abscess. The patient has significant induration ~3-4cm on the left cheek, -Dental x-ray negative for any abscess - Appreciate ENT recommendations, continue IV antibiotics, will transition to oral antibiotics tomorrow (Levaquin and doxycycline for 3 weeks)  Active Problems:  Hyperglycemia due to type 2 diabetes mellitus; Uncontrolled, Patient has been eating outside food per RN - Hemoglobin A1c significantly elevated at 16.8 -  increase Lantus to 25 units, increase meal coverage, increase NovoLog sliding scale to resistant  Monitor closely for any hypoglycemia   Hypertension: Uncontrolled -  increase losartan to 100 mg, Norvasc to 10 mg daily     Hyponatremia: Likely pseudo-hyponatremia due to hyperglycemia, CBG 583 at the time of admission, underlying dehydration. - resolved, DC'd IV fluids   Anemia of chronic disease -Anemia panel obtained- consistent with anemia of chronic disease, Ferritin elevated   Code Status: Full code   Family Communication: Discussed in detail with the  patient, all imaging results, lab results explained to the patient   Disposition Plan:  likely DC in a.m.  Time Spent in minutes   25 minutes  Procedures  CT maxillofacial  Consults   ENT  DVT Prophylaxis  Lovenox  Medications  Scheduled Meds: . amLODipine  5 mg Oral Daily  . artificial tears   Right Eye TID  . enoxaparin (LOVENOX) injection  40 mg Subcutaneous Q24H  . Influenza vac split quadrivalent PF  0.5 mL Intramuscular Tomorrow-1000  . insulin aspart  0-15 Units Subcutaneous TID WC  . insulin aspart  5 Units Subcutaneous TID WC  . insulin glargine  20 Units Subcutaneous QHS  . losartan  50 mg Oral Daily  . neomycin-bacitracin-polymyxin   Topical TID  . piperacillin-tazobactam (ZOSYN)  IV  3.375 g Intravenous Q8H  . vancomycin  1,000 mg Intravenous Q12H   Continuous Infusions:   PRN Meds:.acetaminophen **OR** acetaminophen, menthol-cetylpyridinium, morphine injection, ondansetron **OR** ondansetron (ZOFRAN) IV, polyvinyl alcohol   Antibiotics   Anti-infectives    Start     Dose/Rate Route Frequency Ordered Stop   04/04/15 0500  vancomycin (VANCOCIN) IVPB 1000 mg/200 mL premix  1,000 mg 200 mL/hr over 60 Minutes Intravenous Every 12 hours 04/03/15 1637     04/03/15 2300  piperacillin-tazobactam (ZOSYN) IVPB 3.375 g     3.375 g 12.5 mL/hr over 240 Minutes Intravenous Every 8 hours 04/03/15 1637     04/03/15 1630  vancomycin (VANCOCIN) 1,500 mg in sodium chloride 0.9 % 500 mL IVPB     1,500 mg 250 mL/hr over 120 Minutes Intravenous  Once 04/03/15 1626 04/03/15 2016   04/03/15 1615  piperacillin-tazobactam (ZOSYN) IVPB 3.375 g     3.375 g 100 mL/hr over 30 Minutes Intravenous  Once 04/03/15 1606 04/03/15 1729   04/03/15 1615  vancomycin (VANCOCIN) IVPB 1000 mg/200 mL premix  Status:  Discontinued     1,000 mg 200 mL/hr over 60 Minutes Intravenous  Once 04/03/15 1606 04/03/15 1626   04/03/15 1530  clindamycin (CLEOCIN) IVPB 900 mg  Status:  Discontinued     900  mg 100 mL/hr over 30 Minutes Intravenous  Once 04/03/15 1523 04/03/15 1606        Subjective:   Shane Dougherty was seen and examined today. Significantly improving, no further edema around the eye, still has induration and swelling on the face, improving.  No fevers or chills. Patient denies dizziness, chest pain, shortness of breath, abdominal pain, N/V/D/C, new weakness, numbess, tingling. No acute events overnight.    Objective:   Blood pressure 184/96, pulse 103, temperature 98.9 F (37.2 C), temperature source Oral, resp. rate 18, height 5\' 7"  (1.702 m), weight 67.223 kg (148 lb 3.2 oz), SpO2 98 %.  Wt Readings from Last 3 Encounters:  04/06/15 67.223 kg (148 lb 3.2 oz)  08/20/14 72.576 kg (160 lb)     Intake/Output Summary (Last 24 hours) at 04/07/15 1138 Last data filed at 04/07/15 0507  Gross per 24 hour  Intake    360 ml  Output    478 ml  Net   -118 ml    Exam  General: Alert and oriented x 3, NAD  HEENT:  PERRLA, EOMI, Anicteric Sclera, mucous membranes moist. induration left cheek, tenderness improving, no periorbital edema now    Neck: Supple, no JVD, no masses  CVS: S1 S2 clear, RRR  Respiratory: CTAB  Abdomen: Soft, nontender, nondistended, + bowel sounds  Ext: no cyanosis clubbing or edema  Neuro: no new deficits  skin: See HEENT exam   Psych: Normal affect and demeanor, alert and oriented x3    Data Review   Micro Results No results found for this or any previous visit (from the past 240 hour(s)).  Radiology Reports Dg Orthopantogram  04/05/2015   CLINICAL DATA:  Facial cellulitis.  EXAM: ORTHOPANTOGRAM/PANORAMIC  COMPARISON:  CT maxillofacial 04/03/2015.  FINDINGS: No significant periapical lucencies or dental caries. No focal abnormalities in the mandible or visualized maxilla. No sinus air-fluid level.  IMPRESSION: Negative.   Electronically Signed   By: Elsie Stain M.D.   On: 04/05/2015 12:09   Dg Chest 2 View  04/03/2015   CLINICAL  DATA:  Left-sided chest pain.  EXAM: CHEST  2 VIEW  COMPARISON:  July 30, 2010.  FINDINGS: The heart size and mediastinal contours are within normal limits. Both lungs are clear. No pneumothorax or pleural effusion is noted. The visualized skeletal structures are unremarkable.  IMPRESSION: No active cardiopulmonary disease.   Electronically Signed   By: Lupita Raider, M.D.   On: 04/03/2015 12:01   Ct Maxillofacial W/cm  04/03/2015   CLINICAL DATA:  Left-sided facial  and cheek soft tissue swelling. Cellulitis. Evaluate for abscess.  EXAM: CT MAXILLOFACIAL WITH CONTRAST  TECHNIQUE: Multidetector CT imaging of the maxillofacial structures was performed with intravenous contrast. Multiplanar CT image reconstructions were also generated. A small metallic BB was placed on the right temple in order to reliably differentiate right from left.  CONTRAST:  75mL OMNIPAQUE IOHEXOL 300 MG/ML  SOLN  COMPARISON:  None.  FINDINGS: Asymmetric soft tissue swelling is seen involving the left maxillary and mandibular subcutaneous tissues. No soft tissue abscess identified.  Mild submandibular lymphadenopathy is seen measuring up to 12 mm on image 15 of series 4. Bilateral submental lymphadenopathy is also seen measuring up to 14 mm. Small less than 1 cm right submandibular lymph nodes noted.  Globes and intraorbital anatomy are normal in appearance. No orbital or facial bone fracture or other bone lesions identified. Paranasal sinuses are clear.  IMPRESSION: Left maxillary and mandibular soft tissue swelling. No evidence of abscess.  Mild left submandibular and bilateral submental lymphadenopathy, likely reactive in etiology.   Electronically Signed   By: Myles Rosenthal M.D.   On: 04/03/2015 17:01    CBC  Recent Labs Lab 04/03/15 1055 04/04/15 0458 04/07/15 0427  WBC 10.9* 10.1 8.2  HGB 12.2* 10.9* 10.1*  HCT 37.0* 32.7* 31.6*  PLT 333 307 357  MCV 79.4 79.0 79.6  MCH 26.2 26.3 25.4*  MCHC 33.0 33.3 32.0  RDW 13.8  13.8 13.9    Chemistries   Recent Labs Lab 04/03/15 1055 04/04/15 0458 04/05/15 0634 04/07/15 0427  NA 130* 132* 134* 135  K 3.7 3.3* 3.6 3.3*  CL 94* 99* 98* 100*  CO2 GLUCOSE 583* 288* 292* 398*  BUN CREATININE 1.00 0.95 0.98 1.59*  CALCIUM 8.5* 7.7* 8.2* 7.7*  AST  --  17  --   --   ALT  --  5*  --   --   ALKPHOS  --  167*  --   --   BILITOT  --  0.4  --   --    ------------------------------------------------------------------------------------------------------------------ estimated creatinine clearance is 57.7 mL/min (by C-G formula based on Cr of 1.59). ------------------------------------------------------------------------------------------------------------------ No results for input(s): HGBA1C in the last 72 hours. ------------------------------------------------------------------------------------------------------------------ No results for input(s): CHOL, HDL, LDLCALC, TRIG, CHOLHDL, LDLDIRECT in the last 72 hours. ------------------------------------------------------------------------------------------------------------------ No results for input(s): TSH, T4TOTAL, T3FREE, THYROIDAB in the last 72 hours.  Invalid input(s): FREET3 ------------------------------------------------------------------------------------------------------------------ No results for input(s): VITAMINB12, FOLATE, FERRITIN, TIBC, IRON, RETICCTPCT in the last 72 hours.  Coagulation profile No results for input(s): INR, PROTIME in the last 168 hours.  No results for input(s): DDIMER in the last 72 hours.  Cardiac Enzymes No results for input(s): CKMB, TROPONINI, MYOGLOBIN in the last 168 hours.  Invalid input(s): CK ------------------------------------------------------------------------------------------------------------------ Invalid input(s): POCBNP   Recent Labs  04/05/15 2108 04/06/15 0818 04/06/15 1147 04/06/15 1644 04/06/15 2152 04/07/15 0734   GLUCAP 204* 267* 231* 124* 249* 455*     RAI,RIPUDEEP M.D. [facility] 04/07/2015, 11:38 AM  Pager: [phone] Between 7am to 7pm - call Pager - [phone]  After 7pm go to [email or web address] - password [email or web address]  Call night coverage person covering after 7pm

## 2015-04-07 NOTE — Progress Notes (Signed)
Pt states of feeling sweaty. CBG check- 87. Will continue to monitor pt.   Shane Dougherty

## 2015-04-07 NOTE — Progress Notes (Signed)
ANTIBIOTIC CONSULT NOTE - FOLLOW UP  Pharmacy Consult for Vancomycin and Zosyn Indication: facial cellulitis  Allergies  Allergen Reactions  . Bee Venom Swelling    Throat involvement    Patient Measurements: Height:  (170.2 cm) Weight: 148 lb 3.2 oz (67.223 kg) IBW/kg (Calculated) : 66.1  Vital Signs: Temp: 98.9 F (37.2 C) (09/30 0914) Temp Source: Oral (09/30 0914) BP: 159/76 mmHg (09/30 1221) Pulse Rate: 91 (09/30 1221)  Labs:  Recent Labs  04/05/15 0634 04/07/15 0427  WBC  --  8.2  HGB  --  10.1*  PLT  --  357  CREATININE 0.98 1.59*   Estimated Creatinine Clearance: 57.7 mL/min (by C-G formula based on Cr of 1.59).  Recent Labs  04/05/15 1605  VANCOTROUGH 15    Assessment:  Day # 5 Vancomycin and Zosyn for facial cellulitis without abscess. No culture data.  Evaluated by ENT this afternoon.  To continue IV antibiotics until clinically improved, then switch to oral antibiotics for a total of 3 weeks of therapy.    On 9/28, Vancomycin trough level = 15 mcg/ml, prior to 4th maintenance dose.  Now on 9/30, SCr bump from 0.98 to 1.59 associated with decrease in CrCL to ~ 50-60 mL/min. Will dose adjust.   Goal of Therapy:  Vancomycin trough level 10-15 mcg/ml appropriate Zosyn dose for renal function and infection  Plan:   Decrease Vancomycin to 1g IV every 24 hours.   Continue Zosyn 3.375 gm IV q8hrs (each over 4 hrs).  Follow renal function, progress, and transition to oral antibiotics when clinically improved.  Link Snuffer, PharmD, BCPS Clinical Pharmacist [phone]  04/07/2015,1:48 PM

## 2015-04-07 NOTE — Progress Notes (Signed)
Subjective: Admitted for left facial cellulitis without abscess. Patient feels subjectively improved, less edema around left eye, cheeck still with edema and induration but improving slowly  Objective: Vital signs in last 24 hours: Temp:  [98.1 F (36.7 C)-99.1 F (37.3 C)] 99 F (37.2 C) (09/30 0449) Pulse Rate:  [91-108] 108 (09/30 0449) Resp:  [17-18] 17 (09/30 0449) BP: (137-182)/(74-96) 182/96 mmHg (09/30 0449) SpO2:  [98 %-100 %] 99 % (09/30 0449) Weight:  [67.223 kg (148 lb 3.2 oz)] 67.223 kg (148 lb 3.2 oz) (09/29 2038)  Facial nerve intact and symmetric, still has firm induration and edema around left malar/cheek area but is improved from 2 days ago  @LABLAST2 (wbc:2,hgb:2,hct:2,plt:2)  Recent Labs  04/05/15 0634 04/07/15 0427  NA 134* 135  K 3.6 3.3*  CL 98* 100*  CO2 27 28  GLUCOSE 292* 398*  BUN 8 8  CREATININE 0.98 1.59*  CALCIUM 8.2* 7.7*    Medications:  Scheduled Meds: . amLODipine  5 mg Oral Daily  . artificial tears   Right Eye TID  . enoxaparin (LOVENOX) injection  40 mg Subcutaneous Q24H  . Influenza vac split quadrivalent PF  0.5 mL Intramuscular Tomorrow-1000  . insulin aspart  0-15 Units Subcutaneous TID WC  . insulin aspart  5 Units Subcutaneous TID WC  . insulin glargine  20 Units Subcutaneous QHS  . losartan  50 mg Oral Daily  . neomycin-bacitracin-polymyxin   Topical TID  . piperacillin-tazobactam (ZOSYN)  IV  3.375 g Intravenous Q8H  . vancomycin  1,000 mg Intravenous Q12H   Continuous Infusions:  PRN Meds:.acetaminophen **OR** acetaminophen, menthol-cetylpyridinium, morphine injection, ondansetron **OR** ondansetron (ZOFRAN) IV, polyvinyl alcohol  Assessment/Plan: Improved left facial cellulitis without abscess. Once patient is ready for discharge I would recommend Levaquin PO AND Doxycycline PO for total of 14 days given the extent of the soft tissue infection and to cover for MRSA and gram negatives. Patient can follow up as needed, I  counseled him that the soft tissue swelling can take several days to fully resolve.   LOS: 4 days   Melvenia Beam 04/07/2015, 8:34 AM

## 2015-04-08 LAB — BASIC METABOLIC PANEL
ANION GAP: 6 (ref 5–15)
BUN: 7 mg/dL (ref 6–20)
CHLORIDE: 101 mmol/L (ref 101–111)
CO2: 29 mmol/L (ref 22–32)
Calcium: 7.8 mg/dL — ABNORMAL LOW (ref 8.9–10.3)
Creatinine, Ser: 1.57 mg/dL — ABNORMAL HIGH (ref 0.61–1.24)
GFR calc non Af Amer: 54 mL/min — ABNORMAL LOW (ref >60)
GLUCOSE: 177 mg/dL — AB (ref 65–99)
Potassium: 3.2 mmol/L — ABNORMAL LOW (ref 3.5–5.1)
Sodium: 136 mmol/L (ref 135–145)

## 2015-04-08 LAB — CBC
HCT: 29.7 % — ABNORMAL LOW (ref 39.0–52.0)
HEMOGLOBIN: 9.5 g/dL — AB (ref 13.0–17.0)
MCH: 25.7 pg — ABNORMAL LOW (ref 26.0–34.0)
MCHC: 32 g/dL (ref 30.0–36.0)
MCV: 80.3 fL (ref 78.0–100.0)
Platelets: 358 10*3/uL (ref 150–400)
RBC: 3.7 MIL/uL — ABNORMAL LOW (ref 4.22–5.81)
RDW: 14 % (ref 11.5–15.5)
WBC: 8.6 10*3/uL (ref 4.0–10.5)

## 2015-04-08 LAB — GLUCOSE, CAPILLARY
GLUCOSE-CAPILLARY: 350 mg/dL — AB (ref 65–99)
Glucose-Capillary: 182 mg/dL — ABNORMAL HIGH (ref 65–99)
Glucose-Capillary: 72 mg/dL (ref 65–99)
Glucose-Capillary: 101 mg/dL — ABNORMAL HIGH (ref 65–99)

## 2015-04-08 MED ORDER — FERROUS GLUCONATE 324 (38 FE) MG PO TABS: 324.0000 mg | ORAL_TABLET | Freq: Every day | ORAL | Status: DC

## 2015-04-08 MED ORDER — METOPROLOL TARTRATE 25 MG PO TABS: 25.0000 mg | ORAL_TABLET | Freq: Two times a day (BID) | ORAL | Status: DC

## 2015-04-08 MED ORDER — OXYCODONE-ACETAMINOPHEN 5-325 MG PO TABS: 1.0000 | ORAL_TABLET | ORAL | Status: DC | PRN

## 2015-04-08 MED ORDER — LEVOFLOXACIN 500 MG PO TABS: 500.0000 mg | ORAL_TABLET | Freq: Every day | ORAL | Status: DC

## 2015-04-08 MED ORDER — PROMETHAZINE HCL 25 MG PO TABS: 25.0000 mg | ORAL_TABLET | Freq: Four times a day (QID) | ORAL | Status: DC | PRN

## 2015-04-08 MED ORDER — DOXYCYCLINE HYCLATE 100 MG PO CAPS: 100.0000 mg | ORAL_CAPSULE | Freq: Two times a day (BID) | ORAL | Status: DC

## 2015-04-08 MED ORDER — BACITRACIN-NEOMYCIN-POLYMYXIN OINTMENT TUBE: 1.0000 "application " | TOPICAL_OINTMENT | Freq: Three times a day (TID) | CUTANEOUS | Status: DC

## 2015-04-08 MED ORDER — LOSARTAN POTASSIUM 100 MG PO TABS: 100.0000 mg | ORAL_TABLET | Freq: Every day | ORAL | Status: DC

## 2015-04-08 MED ORDER — LOSARTAN POTASSIUM 50 MG PO TABS
50.0000 mg | ORAL_TABLET | Freq: Every day | ORAL | Status: DC
  Administered 2015-04-08: 50 mg via ORAL
  Filled 2015-04-08: qty 1

## 2015-04-08 MED ORDER — LOSARTAN POTASSIUM 50 MG PO TABS: 50.0000 mg | ORAL_TABLET | Freq: Every day | ORAL | Status: DC

## 2015-04-08 MED ORDER — INSULIN GLARGINE 100 UNIT/ML ~~LOC~~ SOLN: 25.0000 [IU] | Freq: Every day | SUBCUTANEOUS | Status: DC

## 2015-04-08 MED ORDER — POTASSIUM CHLORIDE CRYS ER 20 MEQ PO TBCR
40.0000 meq | EXTENDED_RELEASE_TABLET | Freq: Once | ORAL | Status: AC
  Administered 2015-04-08: 40 meq via ORAL
  Filled 2015-04-08: qty 2

## 2015-04-08 MED ORDER — BLOOD GLUCOSE MONITOR KIT: PACK | Status: DC

## 2015-04-08 MED ORDER — METOPROLOL TARTRATE 25 MG PO TABS
25.0000 mg | ORAL_TABLET | Freq: Two times a day (BID) | ORAL | Status: DC
  Administered 2015-04-08: 25 mg via ORAL
  Filled 2015-04-08: qty 1

## 2015-04-08 MED ORDER — OXYCODONE-ACETAMINOPHEN 5-325 MG PO TABS: 1.0000 | ORAL_TABLET | Freq: Four times a day (QID) | ORAL | Status: DC | PRN

## 2015-04-08 MED ORDER — INSULIN ASPART 100 UNIT/ML ~~LOC~~ SOLN: 10.0000 [IU] | Freq: Three times a day (TID) | SUBCUTANEOUS | Status: DC

## 2015-04-08 MED ORDER — INSULIN SYRINGES (DISPOSABLE) U-100 0.5 ML MISC: Status: DC

## 2015-04-08 MED ORDER — FERROUS GLUCONATE 324 (38 FE) MG PO TABS
324.0000 mg | ORAL_TABLET | Freq: Every day | ORAL | Status: DC
  Administered 2015-04-08: 324 mg via ORAL
  Filled 2015-04-08 (×2): qty 1

## 2015-04-08 MED ORDER — AMLODIPINE BESYLATE 10 MG PO TABS: 10.0000 mg | ORAL_TABLET | Freq: Every day | ORAL | Status: DC

## 2015-04-08 NOTE — Discharge Summary (Signed)
Physician Discharge Summary   Patient ID: Shane Dougherty MRN: [id number] DOB/AGE: 1975-08-06 39 y.o.  Admit date: 04/03/2015 Discharge date: 04/08/2015  Primary Care Physician:  Donnie Coffin, MD  Discharge Diagnoses:    . Facial cellulitis . Hyponatremia . uncontrolled type 2 diabetes mellitus  . uncontrolled hypertension  . hyponatremia    Anemia of chronic disease  Consults:   ENT, Dr. Simeon Craft   Recommendations for Outpatient Follow-up:  Patient was recommended to follow-up with Dr. Simeon Craft in 2 weeks. He was transitioned to oral antibiotics for 2 more weeks.  He will need very close monitoring of his CBGs. Patient had been out of his insulin for months. Hemoglobin A1c 16.8. Patient was given prescription for Lantus, NovoLog, glucometer, lancets strips and insulin syringes  BP was uncontrolled during hospitalization, started on 3 antihypertensives.  TESTS THAT NEED FOLLOW-UP Lipid panel, hemoglobin A1c, BMET   DIET: Carb modified diet    Allergies:   Allergies  Allergen Reactions  . Bee Venom Swelling    Throat involvement     Discharge Medications:   Medication List    STOP taking these medications        cephALEXin 500 MG capsule  Commonly known as:  KEFLEX      TAKE these medications        amLODipine 10 MG tablet  Commonly known as:  NORVASC  Take 1 tablet (10 mg total) by mouth daily.     artificial tears Oint ophthalmic ointment  Place into the right eye 3 (three) times daily.     blood glucose meter kit and supplies Kit  Dispense based on patient and insurance preference. Use up to four times daily as directed. (FOR ICD-9 250.00, 250.01).     doxycycline 100 MG capsule  Commonly known as:  VIBRAMYCIN  Take 1 capsule (100 mg total) by mouth 2 (two) times daily. X 2 weeks  Start taking on:  04/09/2015     ferrous gluconate 324 MG tablet  Commonly known as:  FERGON  Take 1 tablet (324 mg total) by mouth daily with breakfast.     insulin  aspart 100 UNIT/ML injection  Commonly known as:  NOVOLOG  Inject 10 Units into the skin 3 (three) times daily with meals. Your sliding scale with meals     insulin glargine 100 UNIT/ML injection  Commonly known as:  LANTUS  Inject 0.25 mLs (25 Units total) into the skin at bedtime.     Insulin Syringes (Disposable) U-100 0.5 ML Misc  For Lantus and aspart insulin as per instructions     levofloxacin 500 MG tablet  Commonly known as:  LEVAQUIN  Take 1 tablet (500 mg total) by mouth daily. X 2 weeks  Start taking on:  04/09/2015     losartan 50 MG tablet  Commonly known as:  COZAAR  Take 1 tablet (50 mg total) by mouth daily.     metoprolol tartrate 25 MG tablet  Commonly known as:  LOPRESSOR  Take 1 tablet (25 mg total) by mouth 2 (two) times daily.     multivitamin with minerals tablet  Take 1 tablet by mouth daily.     neomycin-bacitracin-polymyxin Oint  Commonly known as:  NEOSPORIN  Apply 1 application topically 3 (three) times daily. To left cheek     oxyCODONE-acetaminophen 5-325 MG tablet  Commonly known as:  PERCOCET/ROXICET  Take 1-2 tablets by mouth every 6 (six) hours as needed for moderate pain or severe pain.  polyvinyl alcohol 1.4 % ophthalmic solution  Commonly known as:  LIQUIFILM TEARS  Place 3-4 drops into both eyes as needed for dry eyes.     promethazine 25 MG tablet  Commonly known as:  PHENERGAN  Take 1 tablet (25 mg total) by mouth every 6 (six) hours as needed for nausea or vomiting.         Brief H and P: For complete details please refer to admission H and P, but in brief Shane Dougherty is a 39 y.o. male with diabetes mellitus who has not been on insulin for number of months. The patient presents with acute onset of swelling of the left side of his face. He was admitted for facial cellulitis.  Hospital Course:  Left sided Facial cellulitis- Improving. CT maxillofacial showed left maxillary and mandibular soft tissue swelling, no  abscess. The patient had significant induration ~3-4cm on the left cheek  and had worsening to left periorbital edema. Hence ENT was consulted, patient was seen by Dr. Simeon Craft, recommended to continue IV antibiotics until improvement and then transitioned to oral antibiotics for 2 weeks. Dental x-ray negative for any abscess Patient was recommended to continue warm packs, Levaquin and doxycycline for 2 more weeks per Dr. Theressa Millard recommendations on 9/30. He will follow up outpatient with ENT.   Hyperglycemia due to type 2 diabetes mellitus; Uncontrolled, Patient was noticed to be eating outside food during hospitalization. He ran out of his insulin for months.  - Hemoglobin A1c significantly elevated at 16.8. Patient needed frequent titration of his insulin regimen. Lantus was increased to 25 units, meal coverage 10 units 3 times a day. Patient was reminded to [facility] for further adjustment in his insulin regimen.   Hypertension: Uncontrolled -Patient was placed on Norvasc 10 mg, losartan 50 mg, metoprolol 25 mg twice a day. Creatinine function increased to 1.5 after losartan was increased to 163m, currently stable. Please follow BMET.      Hyponatremia: Likely pseudo-hyponatremia due to hyperglycemia, CBG 583 at the time of admission, underlying dehydration. - resolved, DC'd IV fluids   Anemia of chronic disease -Anemia panel obtained- consistent with anemia of chronic disease    Day of Discharge BP 141/93 mmHg  Pulse 89  Temp(Src) 98.6 F (37 C) (Oral)  Resp 18  Ht _0  (1.702 m)  Wt 64.547 kg (142 lb 4.8 oz)  BMI 22.28 kg/m2  SpO2 97%  Physical Exam: General: Alert and awake oriented x3 not in any acute distress. HEENT: anicteric sclera, pupils reactive to light and accommodation, left-sided facial cellulitis improving CVS: S1-S2 clear no murmur rubs or gallops Chest: clear to auscultation bilaterally, no wheezing rales or  rhonchi Abdomen: soft nontender, nondistended, normal bowel sounds Extremities: no cyanosis, clubbing or edema noted bilaterally Neuro: Cranial nerves II-XII intact, no focal neurological deficits   The results of significant diagnostics from this hospitalization (including imaging, microbiology, ancillary and laboratory) are listed below for reference.    LAB RESULTS: Basic Metabolic Panel:  Recent Labs Lab 04/07/15 0427 04/08/15 0615  NA 135 136  K 3.3* 3.2*  CL 100* 101  CO2 28 29  GLUCOSE 398* 177*  BUN 8 7  CREATININE 1.59* 1.57*  CALCIUM 7.7* 7.8*   Liver Function Tests:  Recent Labs Lab 04/04/15 0458  AST 17  ALT 5*  ALKPHOS 167*  BILITOT 0.4  PROT 5.5*  ALBUMIN 1.6*   No results for input(s): LIPASE, AMYLASE in the last 168 hours.  No results for input(s): AMMONIA in the last 168 hours. CBC:  Recent Labs Lab 04/07/15 0427 04/08/15 0615  WBC 8.2 8.6  HGB 10.1* 9.5*  HCT 31.6* 29.7*  MCV 79.6 80.3  PLT 357 358   Cardiac Enzymes: No results for input(s): CKTOTAL, CKMB, CKMBINDEX, TROPONINI in the last 168 hours. BNP: Invalid input(s): POCBNP CBG:  Recent Labs Lab 04/07/15 2114 04/08/15 0744  GLUCAP 214* 182*    Significant Diagnostic Studies:  Dg Chest 2 View  04/03/2015   CLINICAL DATA:  Left-sided chest pain.  EXAM: CHEST  2 VIEW  COMPARISON:  July 30, 2010.  FINDINGS: The heart size and mediastinal contours are within normal limits. Both lungs are clear. No pneumothorax or pleural effusion is noted. The visualized skeletal structures are unremarkable.  IMPRESSION: No active cardiopulmonary disease.   Electronically Signed   By: Marijo Conception, M.D.   On: 04/03/2015 12:01   Ct Maxillofacial W/cm  04/03/2015   CLINICAL DATA:  Left-sided facial and cheek soft tissue swelling. Cellulitis. Evaluate for abscess.  EXAM: CT MAXILLOFACIAL WITH CONTRAST  TECHNIQUE: Multidetector CT imaging of the maxillofacial structures was performed with  intravenous contrast. Multiplanar CT image reconstructions were also generated. A small metallic BB was placed on the right temple in order to reliably differentiate right from left.  CONTRAST:  84m OMNIPAQUE IOHEXOL 300 MG/ML  SOLN  COMPARISON:  None.  FINDINGS: Asymmetric soft tissue swelling is seen involving the left maxillary and mandibular subcutaneous tissues. No soft tissue abscess identified.  Mild submandibular lymphadenopathy is seen measuring up to 12 mm on image 15 of series 4. Bilateral submental lymphadenopathy is also seen measuring up to 14 mm. Small less than 1 cm right submandibular lymph nodes noted.  Globes and intraorbital anatomy are normal in appearance. No orbital or facial bone fracture or other bone lesions identified. Paranasal sinuses are clear.  IMPRESSION: Left maxillary and mandibular soft tissue swelling. No evidence of abscess.  Mild left submandibular and bilateral submental lymphadenopathy, likely reactive in etiology.   Electronically Signed   By: JEarle GellM.D.   On: 04/03/2015 17:01    2D ECHO:   Disposition and Follow-up:     Discharge Instructions    Diet Carb Modified    Complete by:  As directed      Discharge instructions    Complete by:  As directed   Please use warm-pack to the left cheek. Take your antibiotics for 2 weeks with meal.    It is VERY IMPORTANT that you follow up with a PCP on a regular basis.  Check your blood glucoses before each meal and at bedtime and maintain a log of your readings.  Bring this log with you when you follow up with your PCP so that he or she can adjust your insulin at your follow up visit.     Increase activity slowly    Complete by:  As directed             DISPOSITION: Home   DISCHARGE FOLLOW-UP Follow-up Information    Follow up with [facility]   .   Why:  Appointment: Wednesday, April 12, 2015 @ 10:30am, please call to reschedule if you are unable to keep this  appointment   Contact information:   [address] [place] [phone]     Follow up with GRuby Cola MD. Schedule an appointment as soon as possible for a  visit in 2 weeks.   Specialty:  Otolaryngology   Why:  for hospital follow-up   Contact information:   [address] [place] [address] [phone]        Time spent on Discharge: 35 minutes  Signed:   Maycee Blasco M.D. [facility] 04/08/2015, 10:43 AM Pager: [phone]

## 2015-04-08 NOTE — Progress Notes (Signed)
Discharge home via wheelchair.  

## 2015-04-12 ENCOUNTER — Encounter: Payer: Self-pay | Admitting: Family Medicine

## 2015-04-12 ENCOUNTER — Ambulatory Visit: Payer: Self-pay | Attending: Family Medicine | Admitting: Family Medicine

## 2015-04-12 VITALS — BP 147/87 | HR 93 | Temp 98.3°F | Resp 18 | Ht 67.5 in | Wt 148.0 lb

## 2015-04-12 DIAGNOSIS — Z8249 Family history of ischemic heart disease and other diseases of the circulatory system: Secondary | ICD-10-CM | POA: Insufficient documentation

## 2015-04-12 DIAGNOSIS — G8929 Other chronic pain: Secondary | ICD-10-CM | POA: Insufficient documentation

## 2015-04-12 DIAGNOSIS — N529 Male erectile dysfunction, unspecified: Secondary | ICD-10-CM

## 2015-04-12 DIAGNOSIS — E1165 Type 2 diabetes mellitus with hyperglycemia: Secondary | ICD-10-CM

## 2015-04-12 DIAGNOSIS — L299 Pruritus, unspecified: Secondary | ICD-10-CM

## 2015-04-12 DIAGNOSIS — L03211 Cellulitis of face: Secondary | ICD-10-CM

## 2015-04-12 DIAGNOSIS — M549 Dorsalgia, unspecified: Secondary | ICD-10-CM | POA: Insufficient documentation

## 2015-04-12 DIAGNOSIS — M79606 Pain in leg, unspecified: Secondary | ICD-10-CM | POA: Insufficient documentation

## 2015-04-12 DIAGNOSIS — Z794 Long term (current) use of insulin: Secondary | ICD-10-CM | POA: Insufficient documentation

## 2015-04-12 DIAGNOSIS — I10 Essential (primary) hypertension: Secondary | ICD-10-CM

## 2015-04-12 LAB — COMPREHENSIVE METABOLIC PANEL
ALBUMIN: 2.5 g/dL — AB (ref 3.6–5.1)
ALT: 6 U/L — ABNORMAL LOW (ref 9–46)
AST: 18 U/L (ref 10–40)
Alkaline Phosphatase: 301 U/L — ABNORMAL HIGH (ref 40–115)
BILIRUBIN TOTAL: 0.2 mg/dL (ref 0.2–1.2)
BUN: 14 mg/dL (ref 7–25)
CALCIUM: 8.1 mg/dL — AB (ref 8.6–10.3)
CO2: 31 mmol/L (ref 20–31)
CREATININE: 1.57 mg/dL — AB (ref 0.60–1.35)
Chloride: 100 mmol/L (ref 98–110)
Glucose, Bld: 195 mg/dL — ABNORMAL HIGH (ref 65–99)
Potassium: 3.9 mmol/L (ref 3.5–5.3)
SODIUM: 137 mmol/L (ref 135–146)
TOTAL PROTEIN: 6.2 g/dL (ref 6.1–8.1)

## 2015-04-12 LAB — GLUCOSE, POCT (MANUAL RESULT ENTRY): POC GLUCOSE: 224 mg/dL — AB (ref 70–99)

## 2015-04-12 MED ORDER — TRUEPLUS LANCETS 28G MISC: Status: DC

## 2015-04-12 MED ORDER — TRUE METRIX METER DEVI: 1.0000 | Status: DC

## 2015-04-12 MED ORDER — SILDENAFIL CITRATE 100 MG PO TABS: 50.0000 mg | ORAL_TABLET | Freq: Every day | ORAL | Status: DC | PRN

## 2015-04-12 MED ORDER — GLUCOSE BLOOD VI STRP: ORAL_STRIP | Status: DC

## 2015-04-12 NOTE — Progress Notes (Signed)
Pt's here for HFU,on L cheek facial cellulitis. Pt reports that something bite him and that he thinks it may have been due to a spider bite. Pt was hospitalized for high blood sugar and possible spider bite. Pt reports no pain today. Pt states that he hasn't taken any meds today. No pain reported.  Pt need a new glucose meter kit. 

## 2015-04-12 NOTE — Patient Instructions (Signed)
Diabetes Mellitus and Food It is important for you to manage your blood sugar (glucose) level. Your blood glucose level can be greatly affected by what you eat. Eating healthier foods in the appropriate amounts throughout the day at about the same time each day will help you control your blood glucose level. It can also help slow or prevent worsening of your diabetes mellitus. Healthy eating may even help you improve the level of your blood pressure and reach or maintain a healthy weight.  General recommendations for healthful eating and cooking habits include:  Eating meals and snacks regularly. Avoid going long periods of time without eating to lose weight.  Eating a diet that consists mainly of plant-based foods, such as fruits, vegetables, nuts, legumes, and whole grains.  Using low-heat cooking methods, such as baking, instead of high-heat cooking methods, such as deep frying. Work with your dietitian to make sure you understand how to use the Nutrition Facts information on food labels. HOW CAN FOOD AFFECT ME? Carbohydrates Carbohydrates affect your blood glucose level more than any other type of food. Your dietitian will help you determine how many carbohydrates to eat at each meal and teach you how to count carbohydrates. Counting carbohydrates is important to keep your blood glucose at a healthy level, especially if you are using insulin or taking certain medicines for diabetes mellitus. Alcohol Alcohol can cause sudden decreases in blood glucose (hypoglycemia), especially if you use insulin or take certain medicines for diabetes mellitus. Hypoglycemia can be a life-threatening condition. Symptoms of hypoglycemia (sleepiness, dizziness, and disorientation) are similar to symptoms of having too much alcohol.  If your health care provider has given you approval to drink alcohol, do so in moderation and use the following guidelines:  Women should not have more than one drink per day, and men  should not have more than two drinks per day. One drink is equal to:  12 oz of beer.  5 oz of wine.  1 oz of hard liquor.  Do not drink on an empty stomach.  Keep yourself hydrated. Have water, diet soda, or unsweetened iced tea.  Regular soda, juice, and other mixers might contain a lot of carbohydrates and should be counted. WHAT FOODS ARE NOT RECOMMENDED? As you make food choices, it is important to remember that all foods are not the same. Some foods have fewer nutrients per serving than other foods, even though they might have the same number of calories or carbohydrates. It is difficult to get your body what it needs when you eat foods with fewer nutrients. Examples of foods that you should avoid that are high in calories and carbohydrates but low in nutrients include:  Trans fats (most processed foods list trans fats on the Nutrition Facts label).  Regular soda.  Juice.  Candy.  Sweets, such as cake, pie, doughnuts, and cookies.  Fried foods. WHAT FOODS CAN I EAT? Eat nutrient-rich foods, which will nourish your body and keep you healthy. The food you should eat also will depend on several factors, including:  The calories you need.  The medicines you take.  Your weight.  Your blood glucose level.  Your blood pressure level.  Your cholesterol level. You should eat a variety of foods, including:  Protein.  Lean cuts of meat.  Proteins low in saturated fats, such as fish, egg whites, and beans. Avoid processed meats.  Fruits and vegetables.  Fruits and vegetables that may help control blood glucose levels, such as apples, mangoes, and   yams.  Dairy products.  Choose fat-free or low-fat dairy products, such as milk, yogurt, and cheese.  Grains, bread, pasta, and rice.  Choose whole grain products, such as multigrain bread, whole oats, and brown rice. These foods may help control blood pressure.  Fats.  Foods containing healthful fats, such as nuts,  avocado, olive oil, canola oil, and fish. DOES EVERYONE WITH DIABETES MELLITUS HAVE THE SAME MEAL PLAN? Because every person with diabetes mellitus is different, there is not one meal plan that works for everyone. It is very important that you meet with a dietitian who will help you create a meal plan that is just right for you.   This information is not intended to replace advice given to you by your health care provider. Make sure you discuss any questions you have with your health care provider.   Document Released: 03/21/2005 Document Revised: 07/15/2014 Document Reviewed: 05/21/2013 Elsevier Interactive Patient Education 2016 [employer] Inc[employer]  
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
## 2015-04-13 LAB — CBC WITH DIFFERENTIAL/PLATELET
BASOS ABS: 0 10*3/uL (ref 0.0–0.1)
Basophils Relative: 0 % (ref 0–1)
EOS ABS: 0.6 10*3/uL (ref 0.0–0.7)
EOS PCT: 6 % — AB (ref 0–5)
HEMATOCRIT: 33.5 % — AB (ref 39.0–52.0)
Hemoglobin: 10.9 g/dL — ABNORMAL LOW (ref 13.0–17.0)
LYMPHS ABS: 1.8 10*3/uL (ref 0.7–4.0)
LYMPHS PCT: 19 % (ref 12–46)
MCH: 25.6 pg — AB (ref 26.0–34.0)
MCHC: 32.5 g/dL (ref 30.0–36.0)
MCV: 78.8 fL (ref 78.0–100.0)
MONOS PCT: 5 % (ref 3–12)
MPV: 8.5 fL — AB (ref 8.6–12.4)
Monocytes Absolute: 0.5 10*3/uL (ref 0.1–1.0)
Neutro Abs: 6.5 10*3/uL (ref 1.7–7.7)
Neutrophils Relative %: 70 % (ref 43–77)
PLATELETS: 534 10*3/uL — AB (ref 150–400)
RBC: 4.25 MIL/uL (ref 4.22–5.81)
RDW: 14.8 % (ref 11.5–15.5)
WBC: 9.3 10*3/uL (ref 4.0–10.5)

## 2015-04-21 ENCOUNTER — Telehealth: Payer: Self-pay | Admitting: *Deleted

## 2015-04-21 NOTE — Telephone Encounter (Signed)
Unable to contact pt  No accepting incoming call per subscriber request

## 2015-04-21 NOTE — Telephone Encounter (Signed)
-----   Message from Dessa Phi, MD sent at 04/18/2015  1:34 PM EDT ----- Normal WBC  Improved CBG on CMP compare to CBG done in office  Continue current care plan

## 2015-04-26 ENCOUNTER — Ambulatory Visit: Payer: 59 | Attending: Family Medicine | Admitting: Family Medicine

## 2015-04-26 ENCOUNTER — Encounter: Payer: Self-pay | Admitting: Family Medicine

## 2015-04-26 VITALS — BP 117/82 | HR 97 | Temp 98.3°F | Resp 18 | Ht 67.5 in | Wt 142.0 lb

## 2015-04-26 DIAGNOSIS — L299 Pruritus, unspecified: Secondary | ICD-10-CM | POA: Insufficient documentation

## 2015-04-26 DIAGNOSIS — Z6821 Body mass index (BMI) 21.0-21.9, adult: Secondary | ICD-10-CM | POA: Insufficient documentation

## 2015-04-26 DIAGNOSIS — L03211 Cellulitis of face: Secondary | ICD-10-CM | POA: Insufficient documentation

## 2015-04-26 DIAGNOSIS — Z79899 Other long term (current) drug therapy: Secondary | ICD-10-CM | POA: Insufficient documentation

## 2015-04-26 DIAGNOSIS — G8929 Other chronic pain: Secondary | ICD-10-CM | POA: Insufficient documentation

## 2015-04-26 DIAGNOSIS — Z794 Long term (current) use of insulin: Secondary | ICD-10-CM | POA: Insufficient documentation

## 2015-04-26 DIAGNOSIS — M79606 Pain in leg, unspecified: Secondary | ICD-10-CM | POA: Insufficient documentation

## 2015-04-26 DIAGNOSIS — E1140 Type 2 diabetes mellitus with diabetic neuropathy, unspecified: Secondary | ICD-10-CM | POA: Insufficient documentation

## 2015-04-26 DIAGNOSIS — E1165 Type 2 diabetes mellitus with hyperglycemia: Secondary | ICD-10-CM | POA: Insufficient documentation

## 2015-04-26 DIAGNOSIS — M25561 Pain in right knee: Secondary | ICD-10-CM

## 2015-04-26 DIAGNOSIS — R634 Abnormal weight loss: Secondary | ICD-10-CM | POA: Insufficient documentation

## 2015-04-26 DIAGNOSIS — Z8249 Family history of ischemic heart disease and other diseases of the circulatory system: Secondary | ICD-10-CM | POA: Insufficient documentation

## 2015-04-26 DIAGNOSIS — M25569 Pain in unspecified knee: Secondary | ICD-10-CM | POA: Insufficient documentation

## 2015-04-26 LAB — GLUCOSE, POCT (MANUAL RESULT ENTRY)
POC GLUCOSE: 473 mg/dL — AB (ref 70–99)
POC GLUCOSE: 352 mg/dL — AB (ref 70–99)
POC GLUCOSE: 407 mg/dL — AB (ref 70–99)

## 2015-04-26 MED ORDER — CEPHALEXIN 500 MG PO CAPS: 500.0000 mg | ORAL_CAPSULE | Freq: Two times a day (BID) | ORAL | Status: DC

## 2015-04-26 MED ORDER — INSULIN ASPART 100 UNIT/ML ~~LOC~~ SOLN
10.0000 [IU] | Freq: Once | SUBCUTANEOUS | Status: AC
  Administered 2015-04-26: 10 [IU] via SUBCUTANEOUS

## 2015-04-26 NOTE — Patient Instructions (Signed)
Diabetes Mellitus and Food It is important for you to manage your blood sugar (glucose) level. Your blood glucose level can be greatly affected by what you eat. Eating healthier foods in the appropriate amounts throughout the day at about the same time each day will help you control your blood glucose level. It can also help slow or prevent worsening of your diabetes mellitus. Healthy eating may even help you improve the level of your blood pressure and reach or maintain a healthy weight.  General recommendations for healthful eating and cooking habits include:  Eating meals and snacks regularly. Avoid going long periods of time without eating to lose weight.  Eating a diet that consists mainly of plant-based foods, such as fruits, vegetables, nuts, legumes, and whole grains.  Using low-heat cooking methods, such as baking, instead of high-heat cooking methods, such as deep frying. Work with your dietitian to make sure you understand how to use the Nutrition Facts information on food labels. HOW CAN FOOD AFFECT ME? Carbohydrates Carbohydrates affect your blood glucose level more than any other type of food. Your dietitian will help you determine how many carbohydrates to eat at each meal and teach you how to count carbohydrates. Counting carbohydrates is important to keep your blood glucose at a healthy level, especially if you are using insulin or taking certain medicines for diabetes mellitus. Alcohol Alcohol can cause sudden decreases in blood glucose (hypoglycemia), especially if you use insulin or take certain medicines for diabetes mellitus. Hypoglycemia can be a life-threatening condition. Symptoms of hypoglycemia (sleepiness, dizziness, and disorientation) are similar to symptoms of having too much alcohol.  If your health care provider has given you approval to drink alcohol, do so in moderation and use the following guidelines:  Women should not have more than one drink per day, and men  should not have more than two drinks per day. One drink is equal to:  12 oz of beer.  5 oz of wine.  1 oz of hard liquor.  Do not drink on an empty stomach.  Keep yourself hydrated. Have water, diet soda, or unsweetened iced tea.  Regular soda, juice, and other mixers might contain a lot of carbohydrates and should be counted. WHAT FOODS ARE NOT RECOMMENDED? As you make food choices, it is important to remember that all foods are not the same. Some foods have fewer nutrients per serving than other foods, even though they might have the same number of calories or carbohydrates. It is difficult to get your body what it needs when you eat foods with fewer nutrients. Examples of foods that you should avoid that are high in calories and carbohydrates but low in nutrients include:  Trans fats (most processed foods list trans fats on the Nutrition Facts label).  Regular soda.  Juice.  Candy.  Sweets, such as cake, pie, doughnuts, and cookies.  Fried foods. WHAT FOODS CAN I EAT? Eat nutrient-rich foods, which will nourish your body and keep you healthy. The food you should eat also will depend on several factors, including:  The calories you need.  The medicines you take.  Your weight.  Your blood glucose level.  Your blood pressure level.  Your cholesterol level. You should eat a variety of foods, including:  Protein.  Lean cuts of meat.  Proteins low in saturated fats, such as fish, egg whites, and beans. Avoid processed meats.  Fruits and vegetables.  Fruits and vegetables that may help control blood glucose levels, such as apples, mangoes, and   yams.  Dairy products.  Choose fat-free or low-fat dairy products, such as milk, yogurt, and cheese.  Grains, bread, pasta, and rice.  Choose whole grain products, such as multigrain bread, whole oats, and brown rice. These foods may help control blood pressure.  Fats.  Foods containing healthful fats, such as nuts,  avocado, olive oil, canola oil, and fish. DOES EVERYONE WITH DIABETES MELLITUS HAVE THE SAME MEAL PLAN? Because every person with diabetes mellitus is different, there is not one meal plan that works for everyone. It is very important that you meet with a dietitian who will help you create a meal plan that is just right for you.   This information is not intended to replace advice given to you by your health care provider. Make sure you discuss any questions you have with your health care provider.   Document Released: 03/21/2005 Document Revised: 07/15/2014 Document Reviewed: 05/21/2013 Elsevier Interactive Patient Education 2016 [employer] Inc[employer]  

## 2015-04-26 NOTE — Progress Notes (Signed)
Pt's here for DM f/up and lump on L cheek. Pt reports that he's doing better, since last visit, but stop taking x4 days ago, due to contraindication to meds. Pt had a metal taste in is mouth due to medication.  Pt reports pain and numbness in both Legs and toes x5day ago.Marland Kitchen Pt rates pain 10+.
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
## 2015-04-27 ENCOUNTER — Telehealth: Payer: Self-pay

## 2015-04-27 LAB — HIV ANTIBODY (ROUTINE TESTING W REFLEX): HIV 1&2 Ab, 4th Generation: NONREACTIVE

## 2015-04-27 LAB — TSH: TSH: 2.94 u[IU]/mL (ref 0.350–4.500)

## 2015-04-27 NOTE — Telephone Encounter (Signed)
-----   Message from Jaclyn Shaggy, MD sent at 04/27/2015  9:24 AM EDT ----- Please inform the patient that labs are normal. Thank you.

## 2015-04-27 NOTE — Telephone Encounter (Signed)
CMA called pt, VM said, Per scriber request, phone does not accept incoming calls. Couldn't leave a message for the pt to return my call.

## 2015-05-04 ENCOUNTER — Telehealth: Payer: Self-pay

## 2015-05-04 NOTE — Telephone Encounter (Signed)
CMA called pt and receiving the same message from call placed prior stating that prescriber is not taking calls.

## 2015-05-10 ENCOUNTER — Ambulatory Visit: Payer: Self-pay | Attending: Family Medicine | Admitting: Family Medicine

## 2015-05-10 ENCOUNTER — Encounter: Payer: Self-pay | Admitting: Family Medicine

## 2015-05-10 VITALS — BP 127/89 | HR 99 | Temp 97.5°F | Resp 16 | Ht 67.5 in | Wt 133.0 lb

## 2015-05-10 DIAGNOSIS — E1140 Type 2 diabetes mellitus with diabetic neuropathy, unspecified: Secondary | ICD-10-CM | POA: Insufficient documentation

## 2015-05-10 DIAGNOSIS — I10 Essential (primary) hypertension: Secondary | ICD-10-CM | POA: Insufficient documentation

## 2015-05-10 DIAGNOSIS — R634 Abnormal weight loss: Secondary | ICD-10-CM | POA: Insufficient documentation

## 2015-05-10 DIAGNOSIS — L03211 Cellulitis of face: Secondary | ICD-10-CM | POA: Insufficient documentation

## 2015-05-10 LAB — GLUCOSE, POCT (MANUAL RESULT ENTRY)
POC Glucose: 470 mg/dl — AB (ref 70–99)
POC Glucose: 440 mg/dl — AB (ref 70–99)

## 2015-05-10 MED ORDER — INSULIN ASPART 100 UNIT/ML ~~LOC~~ SOLN
10.0000 [IU] | Freq: Once | SUBCUTANEOUS | Status: AC
  Administered 2015-05-10: 10 [IU] via SUBCUTANEOUS

## 2015-05-10 NOTE — Progress Notes (Signed)
Pt's here for 2wk f/up DM and lump on L cheek. Pt denies pain today.

## 2015-05-10 NOTE — Telephone Encounter (Signed)
Pt was at his appointment today,05/10/15, so i informed patient of his lab results. Pt verbalized that he didn't have any questions and that he understood.

## 2015-05-10 NOTE — Progress Notes (Signed)
Subjective:    Patient ID: Shane Dougherty, male    DOB: Jan 30, 1976, 39 y.o.   MRN: [id number]  HPI 39 year old male with type 1 diabetes mellitus (A1c 16.8), hypertension, left facial cellulitis who comes in here for follow-up visit. He was hospitalized for facial cellulitis and discharged on doxycycline which he later stopped taking due to development of pruritus. Abscess on left cheek has decreased in size but the patient informs me he still notices some bloody alternating with serous discharge. Attempts at referring him to ENT have been unsuccessful because he is yet to obtain the [facility] discounts. He also stopped all his other medications except his insulins due to generalized pruritus which he states has improved at this time but he does have occasional itching here and there. He is also concerned about his weight loss: Has lost 15 pounds in the last 3 weeks and has no appetite. Denies fever. HIV test and TSH have come back normal.  His blood sugar in the clinic is 470 and he tells me he forgot to take his mealtime NovoLog and this was the same excuse he gave a discussed office visit. He tells me he takes NovoLog only when he eats and that is once a day but he has been compliant with his 25 units of Lantus. States his fasting sugars have been in the 90-120 range.  Past Medical History  Diagnosis Date  . Diabetes mellitus without complication ([facility])   . Bell's palsy 08/2014    History reviewed. No pertinent past surgical history.  Social History   Social History  . Marital Status: Single    Spouse Name: N/A  . Number of Children: N/A  . Years of Education: N/A   Occupational History  . Not on file.   Social History Main Topics  . Smoking status: Passive Smoke Exposure - Never Smoker  . Smokeless tobacco: Not on file  . Alcohol Use: Yes     Comment: occasiONAL  . Drug Use: No  . Sexual Activity: Not on file   Other Topics Concern  . Not on file   Social History  Narrative    Allergies  Allergen Reactions  . Bee Venom Swelling    Throat involvement      Review of Systems Constitutional: Negative for fever, chills, diaphoresis, activity change, appetite change and fatigue. HENT: Negative for ear pain, nosebleeds, congestion, facial swelling, rhinorrhea, neck pain, neck stiffness and ear discharge.  Eyes: Negative for pain, discharge, redness, itching and visual disturbance. Respiratory: Negative for cough, choking, chest tightness, shortness of breath, wheezing and stridor.  Cardiovascular: Negative for chest pain, palpitations and leg swelling. Gastrointestinal: Negative for abdominal distention. Genitourinary: Negative for dysuria, urgency, frequency, hematuria, flank pain, decreased urine volume, difficulty urinating and dyspareunia., Positive for erectile dysfunction. Musculoskeletal: Positive for knee and back pain. Neurological: Negative for dizziness, tremors, seizures, syncope, facial asymmetry, speech difficulty, weakness, light-headedness, positive for numbness and negative for headaches.  Skin: See history of present illness. Hematological: Negative for adenopathy. Does not bruise/bleed easily. Psychiatric/Behavioral: Negative for hallucinations, behavioral problems, confusion, dysphoric mood, decreased concentration and agitation.       Objective: Filed Vitals:   05/10/15 1402  BP: 127/89  Pulse: 99  Temp: 97.5 F (36.4 C)  TempSrc: Oral  Resp: 16  Height: 5' 7.5" (1.715 m)  Weight: 133 lb (60.328 kg)  SpO2: 100%      Physical Exam Constitutional: Patient appears thin, No distress. HENT: Normocephalic, atraumatic, External right and  left ear normal. Oropharynx is clear and moist.  Eyes: Conjunctivae and EOM are normal. PERRLA, no scleral icterus. Neck: Normal ROM. Neck supple. No JVD. No tracheal deviation. No thyromegaly. CVS: RRR, S1/S2 +, no murmurs, no gallops, no carotid bruit.  Pulmonary: Effort and breath  sounds normal, no stridor, rhonchi, wheezes, rales.  Abdominal: Soft. BS +,  no distension, tenderness, rebound or guarding. Musculoskeletal: Normal range of motion. No edema and no tenderness.  Lymphadenopathy: No lymphadenopathy noted, cervical, inguinal or axillary Neuro: Alert. Normal reflexes, muscle tone coordination. No cranial nerve deficit. Skin: Left cheek with induration (~2cm in diameter) with central punctum. Tender to palpation, no discharge elicited.  Psychiatric: Normal mood and affect. Behavior, judgment, thought content normal.  Wt Readings from Last 3 Encounters:  05/10/15 133 lb (60.328 kg)  04/26/15 142 lb (64.411 kg)  04/12/15 148 lb (67.132 kg)        Assessment & Plan:  Type 2 diabetes mellitus: Uncontrolled with A1c of 16.8, CBG of 470 He admits just having a meal and forgetting to take his NovoLog coverage and so I will give him 10 units of NovoLog in the clinic and we'll observe him for the next 40 minutes. He reports that fasting sugars are controlled and so I am unable to adjust his regimen at this time but will review his glucometer at his next visit.  Facial cellulitis: He has had a very slow improvement in symptoms as he was unnable to tolerate his antibiotics; i have expressed my concern that malignancy would have to be excluded.Marland Kitchen He was supposed to follow-up with Dr. Emeline Darling (ENT) but has been unable to see him due to lack of insurance. Advised to work on getting his [place] card and [employer] to  facilitate referral to an ENT doctor.   Pruritus: Likely medication induced. He is currently off all his oral medications and is taking only Lantus and NovoLog and reports pruritus is improving. He is to use Benadryl as needed and I will continue to hold off on his other medications and reinstate them one at a time.  Diabetic neuropathy: I would love to place him on gabapentin but given his current complaints of pruritus and a questionable drug  reaction I will hold off at this time.  Weight loss: He has lost over 40 pounds in the last 1 year. I have also expressed my concern that malignancy could also cause this; will need to be referred to ENT to exclude malignancy of the left cheek and would also need a colonoscopy  This could also be secondary to poorly controlled diabetes mellitus.  This note has been created with Education officer, environmental. Any transcriptional errors are unintentional.

## 2015-05-11 DIAGNOSIS — R634 Abnormal weight loss: Secondary | ICD-10-CM | POA: Insufficient documentation

## 2015-06-09 ENCOUNTER — Ambulatory Visit: Payer: 59 | Admitting: Family Medicine

## 2015-08-04 ENCOUNTER — Ambulatory Visit: Payer: Self-pay | Admitting: Family Medicine

## 2015-08-10 ENCOUNTER — Telehealth: Payer: Self-pay | Admitting: Family Medicine

## 2015-08-10 NOTE — Telephone Encounter (Signed)
Patient called requesting medication refill on insulin and viagra. Patient states pharmacy has faxed refill request but no response. Please f/u

## 2015-08-11 MED ORDER — INSULIN ASPART 100 UNIT/ML ~~LOC~~ SOLN: 10.0000 [IU] | Freq: Three times a day (TID) | SUBCUTANEOUS | Status: DC

## 2015-08-11 MED ORDER — INSULIN GLARGINE 100 UNIT/ML ~~LOC~~ SOLN: 25.0000 [IU] | Freq: Every day | SUBCUTANEOUS | Status: DC

## 2015-08-11 MED ORDER — SILDENAFIL CITRATE 100 MG PO TABS: 50.0000 mg | ORAL_TABLET | Freq: Every day | ORAL | Status: DC | PRN

## 2015-08-11 MED ORDER — INSULIN GLARGINE 100 UNIT/ML ~~LOC~~ SOLN: 25.0000 [IU] | Freq: Every day | SUBCUTANEOUS | Status: AC

## 2015-08-11 NOTE — Telephone Encounter (Signed)
I have refilled these medications as requested to provide patient enough medication to last until a visit with Dr. Venetia Night.  Patient verbalized understanding and was transferred to the front to make an appointment.  Of note, patient wants [place] to call his insurance company and tell them why he needs Viagra and then he can get it for free. He was told this by a pharmacist at [facility]. I am not aware of this but I do know Monia Pouch will cover it if he undergoes extensive evaluation include diagnostic work up (medical and sexual history and psychosocial evaluation, doppler and ultrasound, and pharmacological response test, as well as extensive lab testing). Therefore, I am not sure that this is feasible for the patient. Will defer to Dr. Venetia Night.

## 2015-08-11 NOTE — Addendum Note (Signed)
Addended by: Juanita Craver A on: 08/11/2015 02:30 PM   Modules accepted: Orders

## 2015-08-22 ENCOUNTER — Other Ambulatory Visit: Payer: Self-pay

## 2015-08-22 MED ORDER — INSULIN LISPRO 100 UNIT/ML ~~LOC~~ SOLN: 10.0000 [IU] | Freq: Three times a day (TID) | SUBCUTANEOUS | Status: DC

## 2015-08-22 MED ORDER — INSULIN LISPRO 100 UNIT/ML ~~LOC~~ SOLN
10.0000 [IU] | Freq: Three times a day (TID) | SUBCUTANEOUS | Status: AC
Start: 2015-08-22 — End: ?

## 2015-08-22 NOTE — Progress Notes (Unsigned)
CMA called patient, patient did not answer. Message was left for the patient to return my call. Patient was being notified to let him know that the NovoLog ordered by the Provider was rejected by his insurance, so we switched him to Humalog, which is covered by his insurance.

## 2015-08-29 ENCOUNTER — Encounter (HOSPITAL_COMMUNITY): Payer: Self-pay | Admitting: Cardiology

## 2015-08-29 ENCOUNTER — Emergency Department (HOSPITAL_COMMUNITY): Payer: Managed Care, Other (non HMO)

## 2015-08-29 ENCOUNTER — Emergency Department (HOSPITAL_COMMUNITY)
Admission: EM | Admit: 2015-08-29 | Discharge: 2015-08-30 | Disposition: A | Discharge status: Home / Self Care | Payer: Managed Care, Other (non HMO) | Attending: Emergency Medicine | Admitting: Emergency Medicine

## 2015-08-29 DIAGNOSIS — Z792 Long term (current) use of antibiotics: Secondary | ICD-10-CM | POA: Diagnosis not present

## 2015-08-29 DIAGNOSIS — R079 Chest pain, unspecified: Secondary | ICD-10-CM | POA: Diagnosis not present

## 2015-08-29 DIAGNOSIS — K529 Noninfective gastroenteritis and colitis, unspecified: Secondary | ICD-10-CM

## 2015-08-29 DIAGNOSIS — Z794 Long term (current) use of insulin: Secondary | ICD-10-CM | POA: Insufficient documentation

## 2015-08-29 DIAGNOSIS — Z79899 Other long term (current) drug therapy: Secondary | ICD-10-CM | POA: Diagnosis not present

## 2015-08-29 DIAGNOSIS — Z8669 Personal history of other diseases of the nervous system and sense organs: Secondary | ICD-10-CM | POA: Diagnosis not present

## 2015-08-29 DIAGNOSIS — E1165 Type 2 diabetes mellitus with hyperglycemia: Secondary | ICD-10-CM | POA: Insufficient documentation

## 2015-08-29 DIAGNOSIS — R197 Diarrhea, unspecified: Secondary | ICD-10-CM | POA: Diagnosis present

## 2015-08-29 DIAGNOSIS — R739 Hyperglycemia, unspecified: Secondary | ICD-10-CM

## 2015-08-29 DIAGNOSIS — A084 Viral intestinal infection, unspecified: Secondary | ICD-10-CM | POA: Diagnosis not present

## 2015-08-29 LAB — BASIC METABOLIC PANEL
Anion gap: 14 (ref 5–15)
BUN: 11 mg/dL (ref 6–20)
CALCIUM: 8.9 mg/dL (ref 8.9–10.3)
CO2: 22 mmol/L (ref 22–32)
CREATININE: 1.27 mg/dL — AB (ref 0.61–1.24)
Chloride: 98 mmol/L — ABNORMAL LOW (ref 101–111)
GFR calc Af Amer: >60 mL/min (ref >60)
Glucose, Bld: 462 mg/dL — ABNORMAL HIGH (ref 65–99)
Potassium: 4.1 mmol/L (ref 3.5–5.1)
SODIUM: 134 mmol/L — AB (ref 135–145)

## 2015-08-29 LAB — CBC WITH DIFFERENTIAL/PLATELET
BASOS PCT: 0 %
Basophils Absolute: 0 10*3/uL (ref 0.0–0.1)
EOS ABS: 0.1 10*3/uL (ref 0.0–0.7)
Eosinophils Relative: 1 %
HEMATOCRIT: 35.7 % — AB (ref 39.0–52.0)
Hemoglobin: 11.7 g/dL — ABNORMAL LOW (ref 13.0–17.0)
Lymphocytes Relative: 7 %
Lymphs Abs: 0.5 10*3/uL — ABNORMAL LOW (ref 0.7–4.0)
MCH: 26.2 pg (ref 26.0–34.0)
MCHC: 32.8 g/dL (ref 30.0–36.0)
MCV: 80 fL (ref 78.0–100.0)
MONO ABS: 0.3 10*3/uL (ref 0.1–1.0)
MONOS PCT: 4 %
Neutro Abs: 5.9 10*3/uL (ref 1.7–7.7)
Neutrophils Relative %: 88 %
Platelets: 293 10*3/uL (ref 150–400)
RBC: 4.46 MIL/uL (ref 4.22–5.81)
RDW: 13.1 % (ref 11.5–15.5)
WBC: 6.8 10*3/uL (ref 4.0–10.5)

## 2015-08-29 LAB — TROPONIN I: Troponin I: <0.03 ng/mL (ref <0.031)

## 2015-08-29 LAB — LIPASE, BLOOD: Lipase: 39 U/L (ref 11–51)

## 2015-08-29 LAB — CBG MONITORING, ED
GLUCOSE-CAPILLARY: 437 mg/dL — AB (ref 65–99)
Glucose-Capillary: 385 mg/dL — ABNORMAL HIGH (ref 65–99)

## 2015-08-29 MED ORDER — DIPHENOXYLATE-ATROPINE 2.5-0.025 MG PO TABS: 1.0000 | ORAL_TABLET | Freq: Four times a day (QID) | ORAL | Status: DC | PRN

## 2015-08-29 MED ORDER — ONDANSETRON HCL 4 MG/2ML IJ SOLN
4.0000 mg | Freq: Once | INTRAMUSCULAR | Status: AC
  Administered 2015-08-29: 4 mg via INTRAVENOUS
  Filled 2015-08-29: qty 2

## 2015-08-29 MED ORDER — INSULIN ASPART 100 UNIT/ML ~~LOC~~ SOLN
12.0000 [IU] | Freq: Once | SUBCUTANEOUS | Status: AC
  Administered 2015-08-29: 12 [IU] via SUBCUTANEOUS
  Filled 2015-08-29: qty 1

## 2015-08-29 MED ORDER — DICYCLOMINE HCL 20 MG PO TABS: 20.0000 mg | ORAL_TABLET | Freq: Two times a day (BID) | ORAL | Status: DC

## 2015-08-29 MED ORDER — MORPHINE SULFATE (PF) 4 MG/ML IV SOLN
4.0000 mg | INTRAVENOUS | Status: DC | PRN
  Administered 2015-08-29: 4 mg via INTRAVENOUS
  Filled 2015-08-29: qty 1

## 2015-08-29 MED ORDER — SODIUM CHLORIDE 0.9 % IV BOLUS (SEPSIS)
2000.0000 mL | Freq: Once | INTRAVENOUS | Status: AC
  Administered 2015-08-29: 2000 mL via INTRAVENOUS

## 2015-08-29 MED ORDER — DIPHENOXYLATE-ATROPINE 2.5-0.025 MG PO TABS
2.0000 | ORAL_TABLET | Freq: Once | ORAL | Status: AC
  Administered 2015-08-30: 2 via ORAL
  Filled 2015-08-29 (×2): qty 1

## 2015-08-29 MED ORDER — ONDANSETRON 4 MG PO TBDP: 4.0000 mg | ORAL_TABLET | Freq: Three times a day (TID) | ORAL | Status: DC | PRN

## 2015-08-29 MED ORDER — ACETAMINOPHEN 500 MG PO TABS
1000.0000 mg | ORAL_TABLET | Freq: Once | ORAL | Status: AC
  Administered 2015-08-29: 1000 mg via ORAL
  Filled 2015-08-29: qty 2

## 2015-08-29 NOTE — Discharge Instructions (Signed)
°  Rest, push fluids, stay hydrated. Supplement any high blood sugars with insulin from a sliding scale. [place] for diarrhea, Zofran for nausea, Bentyl for cramps.   Viral Gastroenteritis Viral gastroenteritis is also called stomach flu. This illness is caused by a certain type of germ (virus). It can cause sudden watery poop (diarrhea) and throwing up (vomiting). This can cause you to lose body fluids (dehydration). This illness usually lasts for 3 to 8 days. It usually goes away on its own. HOME CARE   Drink enough fluids to keep your pee (urine) clear or pale yellow. Drink small amounts of fluids often.  Ask your doctor how to replace body fluid losses (rehydration).  Avoid:  Foods high in sugar.  Alcohol.  Bubbly (carbonated) drinks.  Tobacco.  Juice.  Caffeine drinks.  Very hot or cold fluids.  Fatty, greasy foods.  Eating too much at one time.  Dairy products until 24 to 48 hours after your watery poop stops.  You may eat foods with active cultures (probiotics). They can be found in some yogurts and supplements.  Wash your hands well to avoid spreading the illness.  Only take medicines as told by your doctor. Do not give aspirin to children. Do not take medicines for watery poop (antidiarrheals).  Ask your doctor if you should keep taking your regular medicines.  Keep all doctor visits as told. GET HELP RIGHT AWAY IF:   You cannot keep fluids down.  You do not pee at least once every 6 to 8 hours.  You are short of breath.  You see blood in your poop or throw up. This may look like coffee grounds.  You have belly (abdominal) pain that gets worse or is just in one small spot (localized).  You keep throwing up or having watery poop.  You have a fever.  The patient is a child younger than 3 months, and he or she has a fever.  The patient is a child older than 3 months, and he or she has a fever and problems that do not go away.  The patient is a child  older than 3 months, and he or she has a fever and problems that suddenly get worse.  The patient is a baby, and he or she has no tears when crying. MAKE SURE YOU:   Understand these instructions.  Will watch your condition.  Will get help right away if you are not doing well or get worse.   This information is not intended to replace advice given to you by your health care provider. Make sure you discuss any questions you have with your health care provider.   Document Released: 12/11/2007 Document Revised: 09/16/2011 Document Reviewed: 04/10/2011 Elsevier Interactive Patient Education [employer]

## 2015-08-29 NOTE — ED Provider Notes (Signed)
CSN: [id number]     Arrival date & time 08/29/15  1833 History   First MD Initiated Contact with Patient 08/29/15 1913     Chief Complaint  Patient presents with  . Chest Pain  . Diarrhea      HPI  She presents for evaluation of abdominal pain nausea vomiting diarrhea. History of diabetes. Is been here visiting a friend who is an inpatient. His had fevers and chills today with nausea vomiting diarrhea since this morning. Has abdominal cramping and presents here.  Generalized malaise and myalgias. No localizing joint pain. No chest pain. Feels weak dizzy and lightheaded.  Past Medical History  Diagnosis Date  . Diabetes mellitus without complication ([place])   . Bell's palsy 08/2014   History reviewed. No pertinent past surgical history. Family History  Problem Relation Age of Onset  . CAD Mother    Social History  Substance Use Topics  . Smoking status: Passive Smoke Exposure - Never Smoker  . Smokeless tobacco: None  . Alcohol Use: Yes     Comment: occasiONAL    Review of Systems  Constitutional: Positive for fever, chills and fatigue. Negative for diaphoresis and appetite change.  HENT: Negative for mouth sores, sore throat and trouble swallowing.   Eyes: Negative for visual disturbance.  Respiratory: Negative for cough, chest tightness, shortness of breath and wheezing.   Cardiovascular: Positive for chest pain.  Gastrointestinal: Positive for nausea, vomiting, abdominal pain and diarrhea. Negative for abdominal distention.  Endocrine: Negative for polydipsia, polyphagia and polyuria.  Genitourinary: Negative for dysuria, frequency, hematuria and decreased urine volume.  Musculoskeletal: Negative for gait problem.  Skin: Negative for color change, pallor and rash.  Neurological: Negative for dizziness, syncope, light-headedness and headaches.  Hematological: Does not bruise/bleed easily.  Psychiatric/Behavioral: Negative for behavioral problems and confusion.       Allergies  Bee venom  Home Medications   Prior to Admission medications   Medication Sig Start Date End Date Taking? Authorizing Provider  insulin glargine (LANTUS) 100 UNIT/ML injection Inject 0.25 mLs (25 Units total) into the skin at bedtime. 08/11/15  Yes Arnoldo Morale, MD  insulin lispro (HUMALOG) 100 UNIT/ML injection Inject 0.1 mLs (10 Units total) into the skin 3 (three) times daily with meals. Your sliding scale with meals. 08/22/15  Yes Arnoldo Morale, MD  Multiple Vitamins-Minerals (MULTIVITAMIN WITH MINERALS) tablet Take 1 tablet by mouth daily.   Yes Historical Provider, MD  neomycin-bacitracin-polymyxin (NEOSPORIN) OINT Apply 1 application topically 3 (three) times daily. To left cheek 04/08/15  Yes Ripudeep K Rai, MD  polyvinyl alcohol (LIQUIFILM TEARS) 1.4 % ophthalmic solution Place 3-4 drops into both eyes as needed for dry eyes.   Yes Historical Provider, MD  amLODipine (NORVASC) 10 MG tablet Take 1 tablet (10 mg total) by mouth daily. Patient not taking: Reported on 04/26/2015 04/08/15   Ripudeep Krystal Eaton, MD  artificial tears (LACRILUBE) OINT ophthalmic ointment Place into the right eye 3 (three) times daily. Patient not taking: Reported on 05/10/2015 08/20/14   Ezequiel Essex, MD  blood glucose meter kit and supplies KIT Dispense based on patient and insurance preference. Use up to four times daily as directed. (FOR ICD-9 250.00, 250.01). 04/08/15   Ripudeep Krystal Eaton, MD  Blood Glucose Monitoring Suppl (TRUE METRIX METER) DEVI 1 each by Does not apply route as directed. Three times daily 04/12/15   Arnoldo Morale, MD  cephALEXin (KEFLEX) 500 MG capsule Take 1 capsule (500 mg total) by mouth 2 (two) times daily.  Patient not taking: Reported on 05/10/2015 04/26/15   Arnoldo Morale, MD  dicyclomine (BENTYL) 20 MG tablet Take 1 tablet (20 mg total) by mouth 2 (two) times daily. 08/29/15   Tanna Furry, MD  diphenoxylate-atropine (LOMOTIL) 2.5-0.025 MG tablet Take 1 tablet by mouth 4 (four)  times daily as needed for diarrhea or loose stools. 08/29/15   Tanna Furry, MD  doxycycline (VIBRAMYCIN) 100 MG capsule Take 1 capsule (100 mg total) by mouth 2 (two) times daily. X 2 weeks Patient not taking: Reported on 04/26/2015 04/09/15   Ripudeep Krystal Eaton, MD  ferrous gluconate (FERGON) 324 MG tablet Take 1 tablet (324 mg total) by mouth daily with breakfast. Patient not taking: Reported on 04/26/2015 04/08/15   Ripudeep Krystal Eaton, MD  glucose blood (TRUE METRIX BLOOD GLUCOSE TEST) test strip Use as instructed 04/12/15   Arnoldo Morale, MD  Insulin Syringes, Disposable, U-100 0.5 ML MISC For Lantus and aspart insulin as per instructions 04/08/15   Ripudeep Krystal Eaton, MD  levofloxacin (LEVAQUIN) 500 MG tablet Take 1 tablet (500 mg total) by mouth daily. X 2 weeks Patient not taking: Reported on 04/26/2015 04/09/15   Ripudeep Krystal Eaton, MD  losartan (COZAAR) 50 MG tablet Take 1 tablet (50 mg total) by mouth daily. Patient not taking: Reported on 05/10/2015 04/08/15   Ripudeep Krystal Eaton, MD  metoprolol tartrate (LOPRESSOR) 25 MG tablet Take 1 tablet (25 mg total) by mouth 2 (two) times daily. Patient not taking: Reported on 04/26/2015 04/08/15   Ripudeep Krystal Eaton, MD  ondansetron (ZOFRAN ODT) 4 MG disintegrating tablet Take 1 tablet (4 mg total) by mouth every 8 (eight) hours as needed for nausea. 08/29/15   Tanna Furry, MD  oxyCODONE-acetaminophen (PERCOCET/ROXICET) 5-325 MG tablet Take 1-2 tablets by mouth every 6 (six) hours as needed for moderate pain or severe pain. Patient not taking: Reported on 04/26/2015 04/08/15   Ripudeep Krystal Eaton, MD  promethazine (PHENERGAN) 25 MG tablet Take 1 tablet (25 mg total) by mouth every 6 (six) hours as needed for nausea or vomiting. Patient not taking: Reported on 04/26/2015 04/08/15   Ripudeep Krystal Eaton, MD  sildenafil (VIAGRA) 100 MG tablet Take 0.5-1 tablets (50-100 mg total) by mouth daily as needed for erectile dysfunction. Doses must be taken at least 24 hours apart Patient not taking:  Reported on 08/29/2015 08/11/15   Arnoldo Morale, MD  TRUEPLUS LANCETS 28G MISC Use as directed three times daily Patient not taking: Reported on 08/29/2015 04/12/15   Arnoldo Morale, MD   BP 123/70 mmHg  Pulse 98  Temp(Src) 101.3 F (38.5 C) (Oral)  Resp 13  Wt 133 lb (60.328 kg)  SpO2 94% Physical Exam  Constitutional: He is oriented to person, place, and time. He appears well-developed and well-nourished. No distress.  HENT:  Head: Normocephalic.  Eyes: Conjunctivae are normal. Pupils are equal, round, and reactive to light. No scleral icterus.  Neck: Normal range of motion. Neck supple. No thyromegaly present.  Cardiovascular: Normal rate and regular rhythm.  Exam reveals no gallop and no friction rub.   No murmur heard. Pulmonary/Chest: Effort normal and breath sounds normal. No respiratory distress. He has no wheezes. He has no rales.  Abdominal: Soft. Bowel sounds are normal. He exhibits no distension. There is no tenderness. There is no rebound.  Soft, and abdomen. No guarding rebound or localized peritoneal irritation.  Musculoskeletal: Normal range of motion.  Neurological: He is alert and oriented to person, place, and time.  Skin: Skin is  warm and dry. No rash noted.  Psychiatric: He has a normal mood and affect. His behavior is normal.    ED Course  Procedures (including critical care time) Labs Review Labs Reviewed  CBC WITH DIFFERENTIAL/PLATELET - Abnormal; Notable for the following:    Hemoglobin 11.7 (*)    HCT 35.7 (*)    Lymphs Abs 0.5 (*)    All other components within normal limits  BASIC METABOLIC PANEL - Abnormal; Notable for the following:    Sodium 134 (*)    Chloride 98 (*)    Glucose, Bld 462 (*)    Creatinine, Ser 1.27 (*)    All other components within normal limits  CBG MONITORING, ED - Abnormal; Notable for the following:    Glucose-Capillary 437 (*)    All other components within normal limits  CBG MONITORING, ED - Abnormal; Notable for the  following:    Glucose-Capillary 385 (*)    All other components within normal limits  LIPASE, BLOOD  TROPONIN I  URINALYSIS, ROUTINE W REFLEX MICROSCOPIC (NOT AT [facility])    Imaging Review Dg Chest Port 1 View  08/29/2015  CLINICAL DATA:  Right-sided in mid chest pain beginning earlier this afternoon with associated shortness of breath. EXAM: PORTABLE CHEST 1 VIEW COMPARISON:  04/03/2015. FINDINGS: 1957 hours. Lordotic positioning. The lungs are clear wiithout focal pneumonia, edema, pneumothorax or pleural effusion. The cardiopericardial silhouette is within normal limits for size. The visualized bony structures of the thorax are intact. Telemetry leads overlie the chest. IMPRESSION: Normal exam. Electronically Signed   By: Misty Stanley M.D.   On: 08/29/2015 20:10   I have personally reviewed and evaluated these images and lab results as part of my medical decision-making.   EKG Interpretation   Date/Time:  Tuesday August 29 2015 19:01:32 EST Ventricular Rate:  107 PR Interval:  174 QRS Duration: 64 QT Interval:  306 QTC Calculation: 408 R Axis:   36 Text Interpretation:  Sinus tachycardia ST changes noted 05-03-2015  Abnormal ECG Confirmed by Jeneen Rinks  MD, [place] ([id number]) on 08/29/2015 7:47:19 PM      MDM   Final diagnoses:  Acute gastroenteritis  Hyperglycemia  Viral gastroenteritis    Patient's sugar is improving but still at 380. He is not acidotic. Does not have leukocytosis. Fever improved after Tylenol. No infiltrate on chest x-ray. I think is appropriate for home treatment. Rest, push fluids. Zofran, Bentyl, Lomotil when necessary. Return if any worsening symptoms.    Tanna Furry, MD 08/29/15 2235

## 2015-08-29 NOTE — ED Notes (Signed)
Pt reports chest pain that started a couple of days ago. Also reports SOB, fatigue and pain all over.

## 2015-10-06 MED FILL — LANTUS SOLOSTAR 100 UNITS/M: 100 | 60 days supply | Qty: 15 | Fill #0

## 2015-10-22 ENCOUNTER — Emergency Department (HOSPITAL_COMMUNITY)
Admission: EM | Admit: 2015-10-22 | Discharge: 2015-10-22 | Disposition: A | Discharge status: Home / Self Care | Payer: Managed Care, Other (non HMO) | Attending: Emergency Medicine | Admitting: Emergency Medicine

## 2015-10-22 ENCOUNTER — Encounter (HOSPITAL_COMMUNITY): Payer: Self-pay | Admitting: *Deleted

## 2015-10-22 DIAGNOSIS — Z79899 Other long term (current) drug therapy: Secondary | ICD-10-CM | POA: Insufficient documentation

## 2015-10-22 DIAGNOSIS — L0231 Cutaneous abscess of buttock: Secondary | ICD-10-CM | POA: Insufficient documentation

## 2015-10-22 DIAGNOSIS — Z7722 Contact with and (suspected) exposure to environmental tobacco smoke (acute) (chronic): Secondary | ICD-10-CM | POA: Insufficient documentation

## 2015-10-22 DIAGNOSIS — Z794 Long term (current) use of insulin: Secondary | ICD-10-CM | POA: Insufficient documentation

## 2015-10-22 DIAGNOSIS — L0291 Cutaneous abscess, unspecified: Secondary | ICD-10-CM

## 2015-10-22 DIAGNOSIS — E119 Type 2 diabetes mellitus without complications: Secondary | ICD-10-CM | POA: Insufficient documentation

## 2015-10-22 MED ORDER — HYDROCODONE-ACETAMINOPHEN 5-325 MG PO TABS: 1.0000 | ORAL_TABLET | ORAL | Status: AC | PRN

## 2015-10-22 MED ORDER — LIDOCAINE-EPINEPHRINE (PF) 2 %-1:200000 IJ SOLN
20.0000 mL | Freq: Once | INTRAMUSCULAR | Status: AC
  Administered 2015-10-22: 20 mL via INTRADERMAL
  Filled 2015-10-22: qty 20

## 2015-10-22 MED ORDER — CLINDAMYCIN HCL 300 MG PO CAPS: 300.0000 mg | ORAL_CAPSULE | Freq: Four times a day (QID) | ORAL | Status: AC

## 2015-10-22 MED ORDER — POVIDONE-IODINE 10 % EX SOLN
CUTANEOUS | Status: AC
  Administered 2015-10-22: 16:00:00
  Filled 2015-10-22: qty 118

## 2015-10-22 NOTE — Discharge Instructions (Signed)
We were able to open the area that appeared like an abscess. We placed about 1-1/2 inches of gauze, to act as a drain, to improve healing. This will need to be removed in 3 or 4 days, if it remains in place. In the meantime, soak in a warm tub 3 times a day for 20 minutes. You'll then need to apply some gauze to the area in case it continues to drain or bleed.  Abscess An abscess is an infected area that contains a collection of pus and debris.It can occur in almost any part of the body. An abscess is also known as a furuncle or boil. CAUSES  An abscess occurs when tissue gets infected. This can occur from blockage of oil or sweat glands, infection of hair follicles, or a minor injury to the skin. As the body tries to fight the infection, pus collects in the area and creates pressure under the skin. This pressure causes pain. People with weakened immune systems have difficulty fighting infections and get certain abscesses more often.  SYMPTOMS Usually an abscess develops on the skin and becomes a painful mass that is red, warm, and tender. If the abscess forms under the skin, you may feel a moveable soft area under the skin. Some abscesses break open (rupture) on their own, but most will continue to get worse without care. The infection can spread deeper into the body and eventually into the bloodstream, causing you to feel ill.  DIAGNOSIS  Your caregiver will take your medical history and perform a physical exam. A sample of fluid may also be taken from the abscess to determine what is causing your infection. TREATMENT  Your caregiver may prescribe antibiotic medicines to fight the infection. However, taking antibiotics alone usually does not cure an abscess. Your caregiver may need to make a small cut (incision) in the abscess to drain the pus. In some cases, gauze is packed into the abscess to reduce pain and to continue draining the area. HOME CARE INSTRUCTIONS   Only take over-the-counter or  prescription medicines for pain, discomfort, or fever as directed by your caregiver.  If you were prescribed antibiotics, take them as directed. Finish them even if you start to feel better.  If gauze is used, follow your caregiver's directions for changing the gauze.  To avoid spreading the infection:  Keep your draining abscess covered with a bandage.  Wash your hands well.  Do not share personal care items, towels, or whirlpools with others.  Avoid skin contact with others.  Keep your skin and clothes clean around the abscess.  Keep all follow-up appointments as directed by your caregiver. SEEK MEDICAL CARE IF:   You have increased pain, swelling, redness, fluid drainage, or bleeding.  You have muscle aches, chills, or a general ill feeling.  You have a fever. MAKE SURE YOU:   Understand these instructions.  Will watch your condition.  Will get help right away if you are not doing well or get worse.   This information is not intended to replace advice given to you by your health care provider. Make sure you discuss any questions you have with your health care provider.   Document Released: 04/03/2005 Document Revised: 12/24/2011 Document Reviewed: 09/06/2011 Elsevier Interactive Patient Education 2016 [employer]  Incision and Drainage Incision and drainage is a procedure in which a sac-like structure (cystic structure) is opened and drained. The area to be drained usually contains material such as pus, fluid, or blood.  LET YOUR  CAREGIVER KNOW ABOUT:   Allergies to medicine.  Medicines taken, including vitamins, herbs, eyedrops, over-the-counter medicines, and creams.  Use of steroids (by mouth or creams).  Previous problems with anesthetics or numbing medicines.  History of bleeding problems or blood clots.  Previous surgery.  Other health problems, including diabetes and kidney problems.  Possibility of pregnancy, if this applies. RISKS AND  COMPLICATIONS  Pain.  Bleeding.  Scarring.  Infection. BEFORE THE PROCEDURE  You may need to have an ultrasound or other imaging tests to see how large or deep your cystic structure is. Blood tests may also be used to determine if you have an infection or how severe the infection is. You may need to have a tetanus shot. PROCEDURE  The affected area is cleaned with a cleaning fluid. The cyst area will then be numbed with a medicine (local anesthetic). A small incision will be made in the cystic structure. A syringe or catheter may be used to drain the contents of the cystic structure, or the contents may be squeezed out. The area will then be flushed with a cleansing solution. After cleansing the area, it is often gently packed with a gauze or another wound dressing. Once it is packed, it will be covered with gauze and tape or some other type of wound dressing. AFTER THE PROCEDURE   Often, you will be allowed to go home right after the procedure.  You may be given antibiotic medicine to prevent or heal an infection.  If the area was packed with gauze or some other wound dressing, you will likely need to come back in 1 to 2 days to get it removed.  The area should heal in about 14 days.   This information is not intended to replace advice given to you by your health care provider. Make sure you discuss any questions you have with your health care provider.   Document Released: 12/18/2000 Document Revised: 12/24/2011 Document Reviewed: 08/19/2011 Elsevier Interactive Patient Education [employer]

## 2015-10-22 NOTE — ED Notes (Signed)
Pain, swelling noted to left buttocks midline and above rectum.

## 2015-10-22 NOTE — ED Notes (Signed)
Pt comes stating he has an abscess on his buttocks, pt believes this is has been there for a couple of days. Pt denies any drainage.

## 2015-10-22 NOTE — ED Provider Notes (Signed)
CSN: [id number]     Arrival date & time 10/22/15  1540 History   First MD Initiated Contact with Patient 10/22/15 1553     Chief Complaint  Patient presents with  . Abscess     (Consider location/radiation/quality/duration/timing/severity/associated sxs/prior Treatment) HPI   Shane Dougherty is a 40 y.o. male who presents for evaluation of a sore area between his buttock cheeks, present for several days. No prior similar problem. No fever, chills, nausea, vomiting, weakness or dizziness. There are no other known modifying factors.  Past Medical History  Diagnosis Date  . Diabetes mellitus without complication ([place])   . Bell's palsy 08/2014   History reviewed. No pertinent past surgical history. Family History  Problem Relation Age of Onset  . CAD Mother    Social History  Substance Use Topics  . Smoking status: Passive Smoke Exposure - Never Smoker  . Smokeless tobacco: None  . Alcohol Use: Yes     Comment: occasiONAL    Review of Systems  All other systems reviewed and are negative.     Allergies  Bee venom  Home Medications   Prior to Admission medications   Medication Sig Start Date End Date Taking? Authorizing Provider  acetaminophen (TYLENOL) 500 MG tablet Take 500-1,000 mg by mouth every 6 (six) hours as needed for mild pain or moderate pain.   Yes Historical Provider, MD  insulin glargine (LANTUS) 100 UNIT/ML injection Inject 0.25 mLs (25 Units total) into the skin at bedtime. 08/11/15  Yes Arnoldo Morale, MD  insulin lispro (HUMALOG) 100 UNIT/ML injection Inject 0.1 mLs (10 Units total) into the skin 3 (three) times daily with meals. Your sliding scale with meals. 08/22/15  Yes Arnoldo Morale, MD  polyvinyl alcohol (LIQUIFILM TEARS) 1.4 % ophthalmic solution Place 3-4 drops into both eyes as needed for dry eyes.   Yes Historical Provider, MD  blood glucose meter kit and supplies KIT Dispense based on patient and insurance preference. Use up to four times daily as  directed. (FOR ICD-9 250.00, 250.01). 04/08/15   Ripudeep Krystal Eaton, MD  Blood Glucose Monitoring Suppl (TRUE METRIX METER) DEVI 1 each by Does not apply route as directed. Three times daily 04/12/15   Arnoldo Morale, MD  clindamycin (CLEOCIN) 300 MG capsule Take 1 capsule (300 mg total) by mouth 4 (four) times daily. X 7 days 10/22/15   Daleen Bo, MD  dicyclomine (BENTYL) 20 MG tablet Take 1 tablet (20 mg total) by mouth 2 (two) times daily. Patient not taking: Reported on 10/22/2015 08/29/15   Tanna Furry, MD  diphenoxylate-atropine (LOMOTIL) 2.5-0.025 MG tablet Take 1 tablet by mouth 4 (four) times daily as needed for diarrhea or loose stools. Patient not taking: Reported on 10/22/2015 08/29/15   Tanna Furry, MD  glucose blood (TRUE METRIX BLOOD GLUCOSE TEST) test strip Use as instructed 04/12/15   Arnoldo Morale, MD  HYDROcodone-acetaminophen (NORCO) 5-325 MG tablet Take 1-2 tablets by mouth every 4 (four) hours as needed. 10/22/15   Daleen Bo, MD  Insulin Syringes, Disposable, U-100 0.5 ML MISC For Lantus and aspart insulin as per instructions 04/08/15   Ripudeep K Rai, MD  ondansetron (ZOFRAN ODT) 4 MG disintegrating tablet Take 1 tablet (4 mg total) by mouth every 8 (eight) hours as needed for nausea. Patient not taking: Reported on 10/22/2015 08/29/15   Tanna Furry, MD  sildenafil (VIAGRA) 100 MG tablet Take 0.5-1 tablets (50-100 mg total) by mouth daily as needed for erectile dysfunction. Doses must be taken at  least 24 hours apart Patient not taking: Reported on 08/29/2015 08/11/15   Arnoldo Morale, MD   BP 127/61 mmHg  Pulse 111  Temp(Src) 99 F (37.2 C) (Oral)  Resp 16  Ht '5\' 7"'  (1.702 m)  Wt 144 lb (65.318 kg)  BMI 22.55 kg/m2  SpO2 99% Physical Exam  Constitutional: He is oriented to person, place, and time. He appears well-developed and well-nourished.  HENT:  Head: Normocephalic and atraumatic.  Right Ear: External ear normal.  Left Ear: External ear normal.  Eyes: Conjunctivae and EOM are  normal. Pupils are equal, round, and reactive to light.  Neck: Normal range of motion and phonation normal. Neck supple.  Pulmonary/Chest: Effort normal. He exhibits no bony tenderness.  Abdominal: Soft. There is no tenderness.  Musculoskeletal: Normal range of motion.  Neurological: He is alert and oriented to person, place, and time. No cranial nerve deficit or sensory deficit. He exhibits normal muscle tone. Coordination normal.  Skin: Skin is warm, dry and intact.  Induration, upper intergluteal region, with a soft, fluctuant area in the mid aspect (1.5 cm), primarily on the left medial buttock. The entire area measures about 3 x 4 cm and is 70% on the left buttock. There is no surrounding erythema. The area is mildly tender to palpation. There is no visible or palpable communication with the anus externally or perianal tissues. There is no drainage from the anus.  Psychiatric: He has a normal mood and affect. His behavior is normal. Judgment and thought content normal.  Nursing note and vitals reviewed.   ED Course  Procedures (including critical care time)  Initial clinical impression- intergluteal subcutaneous infection with small abscess. Doubt systemic illness.  Medications  lidocaine-EPINEPHrine (XYLOCAINE W/EPI) 2 %-1:200000 (PF) injection 20 mL (20 mLs Intradermal Given by Other 10/22/15 1612)  povidone-iodine (BETADINE) 10 % external solution (  Given 10/22/15 1613)    Patient Vitals for the past 24 hrs:  BP Temp Temp src Pulse Resp SpO2 Height Weight  10/22/15 1550 127/61 mmHg 99 F (37.2 C) Oral 111 16 99 % '5\' 7"'  (1.702 m) 144 lb (65.318 kg)   INCISION AND DRAINAGE Performed by: Richarda Blade Consent: Verbal consent obtained. Risks and benefits: risks, benefits and alternatives were discussed Type: abscess  Body area: Intergluteal crease, upper  Anesthesia: local infiltration  Incision was made with a scalpel.  Local anesthetic: lidocaine 2% with  epinephrine  Anesthetic total: 8 ml  Complexity: complex Blunt dissection to break up loculations  Drainage: purulent  Drainage amount: none  Packing material: 1/2 in iodoform gauze- 1.5 inches (essentially a wick)  Patient tolerance: Patient tolerated the procedure well with no immediate complications.    4:37 PM Reevaluation with update and discussion. After initial assessment and treatment, an updated evaluation reveals no additional complaints. Findings discussed with patient and all questions were answered. [facility] Review Labs Reviewed - No data to display  Imaging Review No results found. I have personally reviewed and evaluated these images and lab results as part of my medical decision-making.   EKG Interpretation None      MDM   Final diagnoses:  Abscess    Intergluteal infection with small abscess, not draining. Doubt systemic illness or diabetic complication. He is stable for discharge with close follow-up with his primary care provider.  Nursing Notes Reviewed/ Care Coordinated Applicable Imaging Reviewed Interpretation of Laboratory Data incorporated into ED treatment  The patient appears reasonably screened and/or stabilized for discharge and  I doubt any other medical condition or other [facility] requiring further screening, evaluation, or treatment in the ED at this time prior to discharge.  Plan: Home Medications- Norco, Cluindamycin; Home Treatments- warm soaks TID; return here if the recommended treatment, does not improve the symptoms; Recommended follow up- PCP check up in 3 days, assess wound, infection and remove drainage wick if still present     Daleen Bo, MD 10/22/15 1641

## 2015-10-23 ENCOUNTER — Telehealth: Payer: Self-pay | Admitting: Family Medicine

## 2015-10-23 MED FILL — CLINDAMYCIN HCL 300 MG CAP: 300 | 7 days supply | Qty: 28 | Fill #0

## 2015-10-23 NOTE — Telephone Encounter (Signed)
Guinevere Ferrari RN from [employer] called requesting to speak to nurse regarding patient high blood pressure, states patient is in need of glucometer. Please f/up with patient  RN phone # is [phone]

## 2015-10-26 ENCOUNTER — Encounter (HOSPITAL_BASED_OUTPATIENT_CLINIC_OR_DEPARTMENT_OTHER): Payer: Self-pay | Admitting: *Deleted

## 2015-10-26 ENCOUNTER — Inpatient Hospital Stay (HOSPITAL_BASED_OUTPATIENT_CLINIC_OR_DEPARTMENT_OTHER)
Admission: EM | Admit: 2015-10-26 | Discharge: 2015-12-07 | DRG: 853 | Disposition: E | Payer: Managed Care, Other (non HMO) | Attending: Internal Medicine | Admitting: Internal Medicine

## 2015-10-26 DIAGNOSIS — I129 Hypertensive chronic kidney disease with stage 1 through stage 4 chronic kidney disease, or unspecified chronic kidney disease: Secondary | ICD-10-CM | POA: Diagnosis present

## 2015-10-26 DIAGNOSIS — L89152 Pressure ulcer of sacral region, stage 2: Secondary | ICD-10-CM | POA: Diagnosis present

## 2015-10-26 DIAGNOSIS — K611 Rectal abscess: Secondary | ICD-10-CM | POA: Diagnosis present

## 2015-10-26 DIAGNOSIS — G931 Anoxic brain damage, not elsewhere classified: Secondary | ICD-10-CM | POA: Diagnosis not present

## 2015-10-26 DIAGNOSIS — J90 Pleural effusion, not elsewhere classified: Secondary | ICD-10-CM | POA: Diagnosis not present

## 2015-10-26 DIAGNOSIS — Z6830 Body mass index (BMI) 30.0-30.9, adult: Secondary | ICD-10-CM | POA: Diagnosis not present

## 2015-10-26 DIAGNOSIS — E1165 Type 2 diabetes mellitus with hyperglycemia: Secondary | ICD-10-CM | POA: Diagnosis present

## 2015-10-26 DIAGNOSIS — D72829 Elevated white blood cell count, unspecified: Secondary | ICD-10-CM

## 2015-10-26 DIAGNOSIS — J918 Pleural effusion in other conditions classified elsewhere: Secondary | ICD-10-CM | POA: Diagnosis not present

## 2015-10-26 DIAGNOSIS — R29898 Other symptoms and signs involving the musculoskeletal system: Secondary | ICD-10-CM | POA: Diagnosis not present

## 2015-10-26 DIAGNOSIS — R0600 Dyspnea, unspecified: Secondary | ICD-10-CM

## 2015-10-26 DIAGNOSIS — A419 Sepsis, unspecified organism: Principal | ICD-10-CM | POA: Diagnosis present

## 2015-10-26 DIAGNOSIS — I639 Cerebral infarction, unspecified: Secondary | ICD-10-CM | POA: Insufficient documentation

## 2015-10-26 DIAGNOSIS — Z794 Long term (current) use of insulin: Secondary | ICD-10-CM | POA: Diagnosis not present

## 2015-10-26 DIAGNOSIS — J9600 Acute respiratory failure, unspecified whether with hypoxia or hypercapnia: Secondary | ICD-10-CM | POA: Diagnosis not present

## 2015-10-26 DIAGNOSIS — N170 Acute kidney failure with tubular necrosis: Secondary | ICD-10-CM | POA: Diagnosis present

## 2015-10-26 DIAGNOSIS — N189 Chronic kidney disease, unspecified: Secondary | ICD-10-CM

## 2015-10-26 DIAGNOSIS — I10 Essential (primary) hypertension: Secondary | ICD-10-CM | POA: Diagnosis present

## 2015-10-26 DIAGNOSIS — N2589 Other disorders resulting from impaired renal tubular function: Secondary | ICD-10-CM | POA: Diagnosis not present

## 2015-10-26 DIAGNOSIS — Z978 Presence of other specified devices: Secondary | ICD-10-CM

## 2015-10-26 DIAGNOSIS — E872 Acidosis: Secondary | ICD-10-CM | POA: Diagnosis not present

## 2015-10-26 DIAGNOSIS — N179 Acute kidney failure, unspecified: Secondary | ICD-10-CM

## 2015-10-26 DIAGNOSIS — I6340 Cerebral infarction due to embolism of unspecified cerebral artery: Secondary | ICD-10-CM | POA: Diagnosis not present

## 2015-10-26 DIAGNOSIS — R072 Precordial pain: Secondary | ICD-10-CM | POA: Insufficient documentation

## 2015-10-26 DIAGNOSIS — E1140 Type 2 diabetes mellitus with diabetic neuropathy, unspecified: Secondary | ICD-10-CM | POA: Diagnosis present

## 2015-10-26 DIAGNOSIS — D638 Anemia in other chronic diseases classified elsewhere: Secondary | ICD-10-CM | POA: Diagnosis present

## 2015-10-26 DIAGNOSIS — R570 Cardiogenic shock: Secondary | ICD-10-CM | POA: Diagnosis not present

## 2015-10-26 DIAGNOSIS — E871 Hypo-osmolality and hyponatremia: Secondary | ICD-10-CM | POA: Diagnosis present

## 2015-10-26 DIAGNOSIS — N471 Phimosis: Secondary | ICD-10-CM | POA: Diagnosis not present

## 2015-10-26 DIAGNOSIS — K5090 Crohn's disease, unspecified, without complications: Secondary | ICD-10-CM | POA: Diagnosis present

## 2015-10-26 DIAGNOSIS — I340 Nonrheumatic mitral (valve) insufficiency: Secondary | ICD-10-CM | POA: Diagnosis not present

## 2015-10-26 DIAGNOSIS — B951 Streptococcus, group B, as the cause of diseases classified elsewhere: Secondary | ICD-10-CM | POA: Diagnosis present

## 2015-10-26 DIAGNOSIS — I469 Cardiac arrest, cause unspecified: Secondary | ICD-10-CM | POA: Insufficient documentation

## 2015-10-26 DIAGNOSIS — I313 Pericardial effusion (noninflammatory): Secondary | ICD-10-CM | POA: Diagnosis present

## 2015-10-26 DIAGNOSIS — B9689 Other specified bacterial agents as the cause of diseases classified elsewhere: Secondary | ICD-10-CM | POA: Diagnosis present

## 2015-10-26 DIAGNOSIS — E1122 Type 2 diabetes mellitus with diabetic chronic kidney disease: Secondary | ICD-10-CM | POA: Diagnosis present

## 2015-10-26 DIAGNOSIS — R531 Weakness: Secondary | ICD-10-CM

## 2015-10-26 DIAGNOSIS — I3139 Other pericardial effusion (noninflammatory): Secondary | ICD-10-CM | POA: Insufficient documentation

## 2015-10-26 DIAGNOSIS — I214 Non-ST elevation (NSTEMI) myocardial infarction: Secondary | ICD-10-CM | POA: Diagnosis not present

## 2015-10-26 DIAGNOSIS — R079 Chest pain, unspecified: Secondary | ICD-10-CM | POA: Diagnosis not present

## 2015-10-26 DIAGNOSIS — L8990 Pressure ulcer of unspecified site, unspecified stage: Secondary | ICD-10-CM | POA: Insufficient documentation

## 2015-10-26 DIAGNOSIS — L0231 Cutaneous abscess of buttock: Secondary | ICD-10-CM | POA: Diagnosis present

## 2015-10-26 DIAGNOSIS — E11319 Type 2 diabetes mellitus with unspecified diabetic retinopathy without macular edema: Secondary | ICD-10-CM | POA: Diagnosis present

## 2015-10-26 DIAGNOSIS — E11649 Type 2 diabetes mellitus with hypoglycemia without coma: Secondary | ICD-10-CM | POA: Diagnosis present

## 2015-10-26 DIAGNOSIS — N183 Chronic kidney disease, stage 3 (moderate): Secondary | ICD-10-CM | POA: Diagnosis not present

## 2015-10-26 DIAGNOSIS — G8250 Quadriplegia, unspecified: Secondary | ICD-10-CM | POA: Diagnosis present

## 2015-10-26 DIAGNOSIS — E43 Unspecified severe protein-calorie malnutrition: Secondary | ICD-10-CM | POA: Diagnosis present

## 2015-10-26 DIAGNOSIS — R935 Abnormal findings on diagnostic imaging of other abdominal regions, including retroperitoneum: Secondary | ICD-10-CM | POA: Diagnosis not present

## 2015-10-26 DIAGNOSIS — E1121 Type 2 diabetes mellitus with diabetic nephropathy: Secondary | ICD-10-CM | POA: Diagnosis present

## 2015-10-26 DIAGNOSIS — R0789 Other chest pain: Secondary | ICD-10-CM | POA: Diagnosis not present

## 2015-10-26 DIAGNOSIS — R197 Diarrhea, unspecified: Secondary | ICD-10-CM | POA: Diagnosis not present

## 2015-10-26 DIAGNOSIS — I209 Angina pectoris, unspecified: Secondary | ICD-10-CM | POA: Diagnosis not present

## 2015-10-26 HISTORY — DX: Chronic kidney disease, unspecified: N18.9

## 2015-10-26 LAB — CBC WITH DIFFERENTIAL/PLATELET
BASOS PCT: 0 %
Basophils Absolute: 0 10*3/uL (ref 0.0–0.1)
EOS ABS: 0 10*3/uL (ref 0.0–0.7)
EOS PCT: 0 %
HEMATOCRIT: 31.6 % — AB (ref 39.0–52.0)
Hemoglobin: 10.6 g/dL — ABNORMAL LOW (ref 13.0–17.0)
Lymphocytes Relative: 5 %
Lymphs Abs: 1.1 10*3/uL (ref 0.7–4.0)
MCH: 26.4 pg (ref 26.0–34.0)
MCHC: 33.5 g/dL (ref 30.0–36.0)
MCV: 78.6 fL (ref 78.0–100.0)
MONO ABS: 1.1 10*3/uL — AB (ref 0.1–1.0)
MONOS PCT: 5 %
NEUTROS ABS: 20.7 10*3/uL — AB (ref 1.7–7.7)
Neutrophils Relative %: 90 %
PLATELETS: 453 10*3/uL — AB (ref 150–400)
RBC: 4.02 MIL/uL — ABNORMAL LOW (ref 4.22–5.81)
RDW: 13.2 % (ref 11.5–15.5)
WBC: 22.8 10*3/uL — ABNORMAL HIGH (ref 4.0–10.5)

## 2015-10-26 LAB — BASIC METABOLIC PANEL
Anion gap: 10 (ref 5–15)
BUN: 34 mg/dL — ABNORMAL HIGH (ref 6–20)
CALCIUM: 8.1 mg/dL — AB (ref 8.9–10.3)
CO2: 21 mmol/L — AB (ref 22–32)
CREATININE: 1.93 mg/dL — AB (ref 0.61–1.24)
Chloride: 99 mmol/L — ABNORMAL LOW (ref 101–111)
GFR, EST AFRICAN AMERICAN: 48 mL/min — AB (ref >60)
GFR, EST NON AFRICAN AMERICAN: 42 mL/min — AB (ref >60)
Glucose, Bld: 291 mg/dL — ABNORMAL HIGH (ref 65–99)
Potassium: 3.8 mmol/L (ref 3.5–5.1)
Sodium: 130 mmol/L — ABNORMAL LOW (ref 135–145)

## 2015-10-26 LAB — CBC
HEMATOCRIT: 30.2 % — AB (ref 39.0–52.0)
Hemoglobin: 9.8 g/dL — ABNORMAL LOW (ref 13.0–17.0)
MCH: 25.7 pg — AB (ref 26.0–34.0)
MCHC: 32.5 g/dL (ref 30.0–36.0)
MCV: 79.1 fL (ref 78.0–100.0)
Platelets: 541 10*3/uL — ABNORMAL HIGH (ref 150–400)
RBC: 3.82 MIL/uL — AB (ref 4.22–5.81)
RDW: 13.3 % (ref 11.5–15.5)
WBC: 24.5 10*3/uL — ABNORMAL HIGH (ref 4.0–10.5)

## 2015-10-26 LAB — CREATININE, SERUM
CREATININE: 1.96 mg/dL — AB (ref 0.61–1.24)
GFR, EST AFRICAN AMERICAN: 48 mL/min — AB (ref >60)
GFR, EST NON AFRICAN AMERICAN: 41 mL/min — AB (ref >60)

## 2015-10-26 LAB — GLUCOSE, CAPILLARY
GLUCOSE-CAPILLARY: 229 mg/dL — AB (ref 65–99)
Glucose-Capillary: 291 mg/dL — ABNORMAL HIGH (ref 65–99)

## 2015-10-26 LAB — LACTIC ACID, PLASMA: Lactic Acid, Venous: 1.1 mmol/L (ref 0.5–2.0)

## 2015-10-26 MED ORDER — SENNOSIDES-DOCUSATE SODIUM 8.6-50 MG PO TABS
1.0000 | ORAL_TABLET | Freq: Every evening | ORAL | Status: DC | PRN
  Administered 2015-10-26: 1 via ORAL
  Filled 2015-10-26 (×3): qty 1

## 2015-10-26 MED ORDER — ACETAMINOPHEN 500 MG PO TABS: 500.0000 mg | ORAL_TABLET | Freq: Four times a day (QID) | ORAL | Status: DC | PRN

## 2015-10-26 MED ORDER — HYDROCODONE-ACETAMINOPHEN 5-325 MG PO TABS
1.0000 | ORAL_TABLET | ORAL | Status: DC | PRN
  Administered 2015-10-26 – 2015-11-07 (×31): 2 via ORAL
  Administered 2015-11-08: 1 via ORAL
  Administered 2015-11-08 – 2015-11-09 (×3): 2 via ORAL
  Administered 2015-11-09: 1 via ORAL
  Filled 2015-10-26 (×19): qty 2
  Filled 2015-10-26: qty 1
  Filled 2015-10-26 (×2): qty 2
  Filled 2015-10-26: qty 1
  Filled 2015-10-26 (×14): qty 2

## 2015-10-26 MED ORDER — VANCOMYCIN HCL IN DEXTROSE 1-5 GM/200ML-% IV SOLN
1000.0000 mg | Freq: Once | INTRAVENOUS | Status: AC
  Administered 2015-10-26: 1000 mg via INTRAVENOUS
  Filled 2015-10-26: qty 200

## 2015-10-26 MED ORDER — ONDANSETRON HCL 4 MG/2ML IJ SOLN
4.0000 mg | Freq: Four times a day (QID) | INTRAMUSCULAR | Status: DC | PRN
  Administered 2015-10-28: 4 mg via INTRAVENOUS
  Filled 2015-10-26: qty 2

## 2015-10-26 MED ORDER — PIPERACILLIN-TAZOBACTAM 3.375 G IVPB 30 MIN
3.3750 g | Freq: Once | INTRAVENOUS | Status: AC
Start: 2015-10-26 — End: 2015-10-26
  Administered 2015-10-26: 3.375 g via INTRAVENOUS
  Filled 2015-10-26: qty 50

## 2015-10-26 MED ORDER — PIPERACILLIN-TAZOBACTAM 3.375 G IVPB
3.3750 g | Freq: Three times a day (TID) | INTRAVENOUS | Status: DC
  Administered 2015-10-27 – 2015-10-31 (×12): 3.375 g via INTRAVENOUS
  Filled 2015-10-26 (×18): qty 50

## 2015-10-26 MED ORDER — VANCOMYCIN HCL 500 MG IV SOLR
500.0000 mg | Freq: Two times a day (BID) | INTRAVENOUS | Status: DC
  Administered 2015-10-27 – 2015-10-28 (×3): 500 mg via INTRAVENOUS
  Filled 2015-10-26 (×5): qty 500

## 2015-10-26 MED ORDER — MORPHINE SULFATE (PF) 4 MG/ML IV SOLN
4.0000 mg | Freq: Once | INTRAVENOUS | Status: AC
  Administered 2015-10-26: 4 mg via INTRAVENOUS
  Filled 2015-10-26: qty 1

## 2015-10-26 MED ORDER — SODIUM CHLORIDE 0.9 % IV BOLUS (SEPSIS)
2000.0000 mL | Freq: Once | INTRAVENOUS | Status: AC
Start: 2015-10-26 — End: 2015-10-26
  Administered 2015-10-26: 2000 mL via INTRAVENOUS

## 2015-10-26 MED ORDER — SODIUM CHLORIDE 0.9 % IV SOLN: INTRAVENOUS | Status: AC

## 2015-10-26 MED ORDER — SODIUM CHLORIDE 0.9 % IV SOLN
INTRAVENOUS | Status: DC
  Administered 2015-10-26 – 2015-10-29 (×6): via INTRAVENOUS

## 2015-10-26 MED ORDER — INSULIN GLARGINE 100 UNIT/ML ~~LOC~~ SOLN
25.0000 [IU] | Freq: Every day | SUBCUTANEOUS | Status: DC
  Administered 2015-10-26 – 2015-10-29 (×4): 25 [IU] via SUBCUTANEOUS
  Filled 2015-10-26 (×4): qty 0.25

## 2015-10-26 MED ORDER — ONDANSETRON HCL 4 MG PO TABS: 4.0000 mg | ORAL_TABLET | Freq: Four times a day (QID) | ORAL | Status: DC | PRN

## 2015-10-26 MED ORDER — INSULIN ASPART 100 UNIT/ML ~~LOC~~ SOLN
0.0000 [IU] | Freq: Three times a day (TID) | SUBCUTANEOUS | Status: DC
  Administered 2015-10-27: 2 [IU] via SUBCUTANEOUS
  Administered 2015-10-28 – 2015-10-30 (×2): 1 [IU] via SUBCUTANEOUS
  Administered 2015-10-31 – 2015-11-03 (×5): 2 [IU] via SUBCUTANEOUS
  Administered 2015-11-03: 3 [IU] via SUBCUTANEOUS
  Administered 2015-11-03: 2 [IU] via SUBCUTANEOUS
  Administered 2015-11-04: 3 [IU] via SUBCUTANEOUS
  Administered 2015-11-04: 2 [IU] via SUBCUTANEOUS
  Administered 2015-11-04: 5 [IU] via SUBCUTANEOUS
  Administered 2015-11-05 – 2015-11-06 (×6): 3 [IU] via SUBCUTANEOUS
  Administered 2015-11-07 (×2): 2 [IU] via SUBCUTANEOUS
  Administered 2015-11-08 (×2): 1 [IU] via SUBCUTANEOUS
  Administered 2015-11-08: 2 [IU] via SUBCUTANEOUS

## 2015-10-26 MED ORDER — HEPARIN SODIUM (PORCINE) 5000 UNIT/ML IJ SOLN
5000.0000 [IU] | Freq: Three times a day (TID) | INTRAMUSCULAR | Status: DC
  Administered 2015-10-26 – 2015-11-01 (×18): 5000 [IU] via SUBCUTANEOUS
  Filled 2015-10-26 (×16): qty 1

## 2015-10-26 MED ORDER — MORPHINE SULFATE (PF) 2 MG/ML IV SOLN
1.0000 mg | INTRAVENOUS | Status: DC | PRN
  Administered 2015-10-26 – 2015-10-29 (×8): 2 mg via INTRAVENOUS
  Filled 2015-10-26 (×8): qty 1

## 2015-10-26 NOTE — Progress Notes (Signed)
Patient coming for [facility] [place] for admission for worsening perianal abscess that was drained 4 days ago at any pen area did EDP consult and general surgery who is requesting patient transferred to [facility] for further evaluation and possible I&D in the OR. Surgery will need to be called when patient arrives at [facility]. Pt accepted to Medical surgical bed  Shelly Flatten, MD [facility] 2015/11/06, 3:15 PM

## 2015-10-26 NOTE — ED Notes (Signed)
[facility] is aware of bed at [facility] for transfer

## 2015-10-26 NOTE — ED Provider Notes (Signed)
CSN: [id number]     Arrival date & time 10/13/2015  1142 History   First MD Initiated Contact with Patient 10/16/2015 1251     Chief Complaint  Patient presents with  . Abscess     (Consider location/radiation/quality/duration/timing/severity/associated sxs/prior Treatment) Patient is a 40 y.o. male presenting with abscess.  Abscess Associated symptoms: fever    Patient noted to have abscess on left buttock for the past 1 week. He was seen at [facility] emergency department. Abscess incised and drained by Dr. Eulis Foster, 4 days ago, prescribed clindamycin Norco and warm soaks, presents today as area is more painful. Pain is at perirectal area, left side worse with pressure on the area. Not improved by anything. Pain is severe. He denies denies vomiting. Associated symptoms include subjective fever. No other associated symptoms. Past Medical History  Diagnosis Date  . Diabetes mellitus without complication ([place])   . Bell's palsy 08/2014   History reviewed. No pertinent past surgical history. Family History  Problem Relation Age of Onset  . CAD Mother    Social History  Substance Use Topics  . Smoking status: Passive Smoke Exposure - Never Smoker  . Smokeless tobacco: None  . Alcohol Use: Yes     Comment: occasiONAL    Review of Systems  Constitutional: Positive for fever.       Subjective fever  Skin: Positive for wound.       Abscess perirectal area  Allergic/Immunologic: Positive for immunocompromised state.       Diabetic  All other systems reviewed and are negative.     Allergies  Bee venom  Home Medications   Prior to Admission medications   Medication Sig Start Date End Date Taking? Authorizing Provider  acetaminophen (TYLENOL) 500 MG tablet Take 500-1,000 mg by mouth every 6 (six) hours as needed for mild pain or moderate pain.    Historical Provider, MD  blood glucose meter kit and supplies KIT Dispense based on patient and insurance preference. Use up to four times  daily as directed. (FOR ICD-9 250.00, 250.01). 04/08/15   Ripudeep Krystal Eaton, MD  Blood Glucose Monitoring Suppl (TRUE METRIX METER) DEVI 1 each by Does not apply route as directed. Three times daily 04/12/15   Arnoldo Morale, MD  clindamycin (CLEOCIN) 300 MG capsule Take 1 capsule (300 mg total) by mouth 4 (four) times daily. X 7 days 10/22/15   Daleen Bo, MD  dicyclomine (BENTYL) 20 MG tablet Take 1 tablet (20 mg total) by mouth 2 (two) times daily. Patient not taking: Reported on 10/22/2015 08/29/15   Tanna Furry, MD  diphenoxylate-atropine (LOMOTIL) 2.5-0.025 MG tablet Take 1 tablet by mouth 4 (four) times daily as needed for diarrhea or loose stools. Patient not taking: Reported on 10/22/2015 08/29/15   Tanna Furry, MD  glucose blood (TRUE METRIX BLOOD GLUCOSE TEST) test strip Use as instructed 04/12/15   Arnoldo Morale, MD  HYDROcodone-acetaminophen (NORCO) 5-325 MG tablet Take 1-2 tablets by mouth every 4 (four) hours as needed. 10/22/15   Daleen Bo, MD  insulin glargine (LANTUS) 100 UNIT/ML injection Inject 0.25 mLs (25 Units total) into the skin at bedtime. 08/11/15   Arnoldo Morale, MD  insulin lispro (HUMALOG) 100 UNIT/ML injection Inject 0.1 mLs (10 Units total) into the skin 3 (three) times daily with meals. Your sliding scale with meals. 08/22/15   Arnoldo Morale, MD  Insulin Syringes, Disposable, U-100 0.5 ML MISC For Lantus and aspart insulin as per instructions 04/08/15   Ripudeep Krystal Eaton, MD  ondansetron (ZOFRAN ODT) 4 MG disintegrating tablet Take 1 tablet (4 mg total) by mouth every 8 (eight) hours as needed for nausea. Patient not taking: Reported on 10/22/2015 08/29/15   Tanna Furry, MD  polyvinyl alcohol (LIQUIFILM TEARS) 1.4 % ophthalmic solution Place 3-4 drops into both eyes as needed for dry eyes.    Historical Provider, MD  sildenafil (VIAGRA) 100 MG tablet Take 0.5-1 tablets (50-100 mg total) by mouth daily as needed for erectile dysfunction. Doses must be taken at least 24 hours apart Patient  not taking: Reported on 08/29/2015 08/11/15   Arnoldo Morale, MD   BP 119/83 mmHg  Pulse 104  Temp(Src) 99.4 F (37.4 C) (Oral)  Resp 20  Ht '5\' 7"'  (1.702 m)  Wt 144 lb (65.318 kg)  BMI 22.55 kg/m2  SpO2 100% Physical Exam  Constitutional: He appears well-developed and well-nourished.  HENT:  Head: Normocephalic and atraumatic.  Eyes: Conjunctivae are normal. Pupils are equal, round, and reactive to light.  Neck: Neck supple. No tracheal deviation present. No thyromegaly present.  Cardiovascular: Normal rate and regular rhythm.   No murmur heard. Pulmonary/Chest: Effort normal and breath sounds normal.  Abdominal: Soft. Bowel sounds are normal. He exhibits no distension. There is no tenderness.  Genitourinary: Penis normal.  Left side Perirectal area with open wound, no drainage, golf ball size fluctuant area which is exquisitely tender  Musculoskeletal: Normal range of motion. He exhibits no edema or tenderness.  Neurological: He is alert. Coordination normal.  Skin: Skin is warm and dry. No rash noted.  Psychiatric: He has a normal mood and affect.  Nursing note and vitals reviewed.   ED Course  Procedures (including critical care time) Labs Review Labs Reviewed  CBC WITH DIFFERENTIAL/PLATELET  BASIC METABOLIC PANEL    Imaging Review No results found. I have personally reviewed and evaluated these images and lab results as part of my medical decision-making.   EKG Interpretation None     3:10 PM pain is improved after treatment with intravenous morphine and intravenous hydration. Results for orders placed or performed during the hospital encounter of 10/13/2015  CBC with Differential/Platelet  Result Value Ref Range   WBC 22.8 (H) 4.0 - 10.5 K/uL   RBC 4.02 (L) 4.22 - 5.81 MIL/uL   Hemoglobin 10.6 (L) 13.0 - 17.0 g/dL   HCT 31.6 (L) 39.0 - 52.0 %   MCV 78.6 78.0 - 100.0 fL   MCH 26.4 26.0 - 34.0 pg   MCHC 33.5 30.0 - 36.0 g/dL   RDW 13.2 11.5 - 15.5 %   Platelets  453 (H) 150 - 400 K/uL   Neutrophils Relative % 90 %   Neutro Abs 20.7 (H) 1.7 - 7.7 K/uL   Lymphocytes Relative 5 %   Lymphs Abs 1.1 0.7 - 4.0 K/uL   Monocytes Relative 5 %   Monocytes Absolute 1.1 (H) 0.1 - 1.0 K/uL   Eosinophils Relative 0 %   Eosinophils Absolute 0.0 0.0 - 0.7 K/uL   Basophils Relative 0 %   Basophils Absolute 0.0 0.0 - 0.1 K/uL  Basic metabolic panel  Result Value Ref Range   Sodium 130 (L) 135 - 145 mmol/L   Potassium 3.8 3.5 - 5.1 mmol/L   Chloride 99 (L) 101 - 111 mmol/L   CO2 21 (L) 22 - 32 mmol/L   Glucose, Bld 291 (H) 65 - 99 mg/dL   BUN 34 (H) 6 - 20 mg/dL   Creatinine, Ser 1.93 (H) 0.61 - 1.24 mg/dL  Calcium 8.1 (L) 8.9 - 10.3 mg/dL   GFR calc non Af Amer 42 (L) >60 mL/min   GFR calc Af Amer 48 (L) >60 mL/min   Anion gap 10 5 - 15   No results found.  MDM  I consulted Dr.Kinsinger , general surgery who requested admission to hospitalist and he will consult. No intravenous antibiotics warranted per Dr. Kieth Brightly. I also consulted Dr. Marily Memos, from hospitalist service who accepts patient in transfer to [facility] floor. Plan nothing by mouth, IV hydration. Glycemic control. Diagnoses #1 perirectal abscess #2 hyperglycemia  #3 anemia  #4 leukocytosis  #5 acute kidney injury  Final diagnoses:  None        Orlie Dakin, MD 10/16/2015 1517

## 2015-10-26 NOTE — Consult Note (Addendum)
Reason for Consult:buttock abscess Referring Physician: Dr Marta Antu is an 40 y.o. male.  HPI: 40 y.o. male with medical history significant of diabetes. He complains for intermittent loose stool x 1 month after eating meals.. Nothing made better or worse. No history of crohn's/UC- no family hx either. He then a few days before 4/16 developed left buttock pain which significantly worsened on Easter Sunday. He was seen in the ER where he was diagnosed with intergluteal infection with small abscess. An I/D was done that some purulent drainage. He was given norco, clindamycin and told to do warm soaks.  He presented back at [facility] [place] with worsening pain and pressure. Dr. Winfred Leeds spoke with Dr. Kieth Brightly who requested hospitalist transfer to [facility] for possible further intervention. No BM since Saturday. Some difficulty urinating since event started. No fever/chills. Some abd discomfort as well. Decreased appetite.   Past Medical History  Diagnosis Date  . Diabetes mellitus without complication ([place])   . Bell's palsy 08/2014    History reviewed. No pertinent past surgical history.  Family History  Problem Relation Age of Onset  . CAD Mother     Social History:  reports that he has been passively smoking.  He does not have any smokeless tobacco history on file. He reports that he drinks alcohol. He reports that he does not use illicit drugs.  Allergies:  Allergies  Allergen Reactions  . Bee Venom Swelling    Throat involvement    Medications: I have reviewed the patient's current medications.  Results for orders placed or performed during the hospital encounter of 10/11/2015 (from the past 48 hour(s))  CBC with Differential/Platelet     Status: Abnormal   Collection Time: 10/31/2015  1:18 PM  Result Value Ref Range   WBC 22.8 (H) 4.0 - 10.5 K/uL   RBC 4.02 (L) 4.22 - 5.81 MIL/uL   Hemoglobin 10.6 (L) 13.0 - 17.0 g/dL   HCT 31.6 (L) 39.0 - 52.0 %   MCV 78.6  78.0 - 100.0 fL   MCH 26.4 26.0 - 34.0 pg   MCHC 33.5 30.0 - 36.0 g/dL   RDW 13.2 11.5 - 15.5 %   Platelets 453 (H) 150 - 400 K/uL   Neutrophils Relative % 90 %   Neutro Abs 20.7 (H) 1.7 - 7.7 K/uL   Lymphocytes Relative 5 %   Lymphs Abs 1.1 0.7 - 4.0 K/uL   Monocytes Relative 5 %   Monocytes Absolute 1.1 (H) 0.1 - 1.0 K/uL   Eosinophils Relative 0 %   Eosinophils Absolute 0.0 0.0 - 0.7 K/uL   Basophils Relative 0 %   Basophils Absolute 0.0 0.0 - 0.1 K/uL  Basic metabolic panel     Status: Abnormal   Collection Time: 10/08/2015  1:18 PM  Result Value Ref Range   Sodium 130 (L) 135 - 145 mmol/L   Potassium 3.8 3.5 - 5.1 mmol/L   Chloride 99 (L) 101 - 111 mmol/L   CO2 21 (L) 22 - 32 mmol/L   Glucose, Bld 291 (H) 65 - 99 mg/dL   BUN 34 (H) 6 - 20 mg/dL   Creatinine, Ser 1.93 (H) 0.61 - 1.24 mg/dL   Calcium 8.1 (L) 8.9 - 10.3 mg/dL   GFR calc non Af Amer 42 (L) >60 mL/min   GFR calc Af Amer 48 (L) >60 mL/min    Comment: (NOTE) The eGFR has been calculated using the CKD EPI equation. This calculation has not been  validated in all clinical situations. eGFR's persistently <60 mL/min signify possible Chronic Kidney Disease.    Anion gap 10 5 - 15  Glucose, capillary     Status: Abnormal   Collection Time: 10/16/2015  6:27 PM  Result Value Ref Range   Glucose-Capillary 229 (H) 65 - 99 mg/dL    No results found.  Review of Systems  Constitutional: Negative for fever, chills and weight loss.  HENT: Negative for nosebleeds.   Eyes: Negative for blurred vision.  Respiratory: Negative for shortness of breath.   Cardiovascular: Negative for chest pain, palpitations, orthopnea and PND.       Denies DOE  Gastrointestinal: Positive for abdominal pain and constipation. Negative for nausea and vomiting.  Genitourinary: Negative for dysuria and hematuria.       Some difficulty urinating. Decreased amount since infection started  Musculoskeletal: Negative.   Skin: Negative for itching and  rash.  Neurological: Negative for dizziness, focal weakness, seizures, loss of consciousness and headaches.       Denies TIAs, amaurosis fugax  Endo/Heme/Allergies: Does not bruise/bleed easily.  Psychiatric/Behavioral: The patient is not nervous/anxious.    Blood pressure 145/75, pulse 99, temperature 98.5 F (36.9 C), temperature source Oral, resp. rate 20, height '5\' 7"'  (1.702 m), weight 65.318 kg (144 lb), SpO2 97 %. Physical Exam  Vitals reviewed. Constitutional: He is oriented to person, place, and time. He appears well-developed and well-nourished. He appears ill. No distress.  HENT:  Head: Normocephalic and atraumatic.  Right Ear: External ear normal.  Left Ear: External ear normal.  Eyes: Conjunctivae are normal. No scleral icterus.  Neck: Normal range of motion. Neck supple. No tracheal deviation present. No thyromegaly present.  Cardiovascular: Normal rate and normal heart sounds.   Respiratory: Effort normal and breath sounds normal. No stridor. No respiratory distress. He has no wheezes.  GI:  Some abdominal fullness  Genitourinary:  Buttocks -upper inner L gluteal cleft - open wound about 2 in long, width about 2cm. Good granulation tissue. No cellulitis. But significant induration and fluctuance extending laterally and vertically for about 3 in. TTP in that area. Some purulent drainage on gauze. DRE deferred.   Musculoskeletal: He exhibits no edema or tenderness.  Lymphadenopathy:    He has no cervical adenopathy.  Neurological: He is alert and oriented to person, place, and time. He exhibits normal muscle tone.  Skin: Skin is warm and dry. No rash noted. He is not diaphoretic. No erythema. No pallor.  Psychiatric: He has a normal mood and affect. His behavior is normal. Judgment and thought content normal.    Assessment/Plan: L gluteal abscess s/p I&D with ongoing infection  Leukocytosis-see above Insulin-dependent diabetes mellitus Chronic kidney  disease Hypertension Hyponatremia Anemia - unknown etiology  There is no signs of sepsis. I do not believe his abscess has been adequately drained. He has still a fairly significant amount of induration and fluctuance in the area with a persistent leukocytosis. Therefore I think he needs to be started on intravenous antibiotics and exam under anesthesia with further incision, drainage, and debridement of his gluteal abscess. I do not believe he needs to be done urgently this evening. The abscess is draining.  We discussed the risk and benefits of surgery including but not limited to bleeding, ongoing infection, chronic wound, delayed wound healing, need for additional procedures, recurrence, blood clot formation, perioperative anesthesia issues with respect to heart and lungs. This seems remote from his anus so I do not believe this is a  perirectal or perianal abscess. The plan will be for my partner to perform his surgery tomorrow. Nothing by mouth after midnight. I discussed the plan with the hospitalist.  We discussed that this will require daily wound care. And we discussed the typical recovery course.  Leighton Ruff. Redmond Pulling, MD, FACS General, Bariatric, & Minimally Invasive Surgery [facility], [place]   [facility] 10/11/2015, 6:55 PM

## 2015-10-26 NOTE — Progress Notes (Signed)
Pt roomed in [address]. C/o pain in buttocks, MD paged

## 2015-10-26 NOTE — ED Notes (Signed)
Rectal abscess. He had it drained at [facility] 4 days ago. Pain is worse now and he has drainage.

## 2015-10-26 NOTE — Progress Notes (Signed)
Pt says he is a high fall risk but is aware of own limitations. Says he will not try to get up unassisted

## 2015-10-26 NOTE — H&P (Addendum)
History and Physical    Shane Dougherty TMA:[id number] DOB: Sep 08, 1975 DOA: 11/02/2015  Referring MD/NP/PA: Dr. Winfred Leeds PCP: Arnoldo Morale, MD Outpatient Specialists:  Patient coming from: [facility] [place]  Chief Complaint: rectal pain  HPI: Shane Dougherty is a 40 y.o. male with medical history significant of diabetes.  He complains for diarrhea x 1 month. Nothing made better or worse.  No history of crohn's/UC- no family hx either.  He then a few days before 4/16 developed left buttock pain.  He was seen in the ER where he was diagnosed with intergluteal infection with small abscess.  An I/D was done that some purulent drainage.  He was given norco, clindamycin and told to do warm soaks.   He presented back at [facility] [place] with worsening pain and pressure. Dr. Winfred Leeds spoke with Dr. Kieth Brightly who requested hospitalist transfer to [facility] for possible further intervention.  He recommended no abx.    Patient upon exam in severe pain.  No fever.  No chills.  Blood sugars have been high but he is working with his PCP to bring them down   Review of Systems: all systems reviewed, negative unless stated above in HPI   Past Medical History  Diagnosis Date  . Diabetes mellitus without complication ([place])   . Bell's palsy 08/2014    History reviewed. No pertinent past surgical history.   reports that he has been passively smoking.  He does not have any smokeless tobacco history on file. He reports that he drinks alcohol. He reports that he does not use illicit drugs.  Allergies  Allergen Reactions  . Bee Venom Swelling    Throat involvement    Family History  Problem Relation Age of Onset  . CAD Mother      Prior to Admission medications   Medication Sig Start Date End Date Taking? Authorizing Provider  acetaminophen (TYLENOL) 500 MG tablet Take 500-1,000 mg by mouth every 6 (six) hours as needed for mild pain or moderate pain.    Historical Provider, MD  blood glucose meter kit and  supplies KIT Dispense based on patient and insurance preference. Use up to four times daily as directed. (FOR ICD-9 250.00, 250.01). 04/08/15   Ripudeep Krystal Eaton, MD  Blood Glucose Monitoring Suppl (TRUE METRIX METER) DEVI 1 each by Does not apply route as directed. Three times daily 04/12/15   Arnoldo Morale, MD  clindamycin (CLEOCIN) 300 MG capsule Take 1 capsule (300 mg total) by mouth 4 (four) times daily. X 7 days 10/22/15   Daleen Bo, MD  dicyclomine (BENTYL) 20 MG tablet Take 1 tablet (20 mg total) by mouth 2 (two) times daily. Patient not taking: Reported on 10/22/2015 08/29/15   Tanna Furry, MD  diphenoxylate-atropine (LOMOTIL) 2.5-0.025 MG tablet Take 1 tablet by mouth 4 (four) times daily as needed for diarrhea or loose stools. Patient not taking: Reported on 10/22/2015 08/29/15   Tanna Furry, MD  glucose blood (TRUE METRIX BLOOD GLUCOSE TEST) test strip Use as instructed 04/12/15   Arnoldo Morale, MD  HYDROcodone-acetaminophen (NORCO) 5-325 MG tablet Take 1-2 tablets by mouth every 4 (four) hours as needed. 10/22/15   Daleen Bo, MD  insulin glargine (LANTUS) 100 UNIT/ML injection Inject 0.25 mLs (25 Units total) into the skin at bedtime. 08/11/15   Arnoldo Morale, MD  insulin lispro (HUMALOG) 100 UNIT/ML injection Inject 0.1 mLs (10 Units total) into the skin 3 (three) times daily with meals. Your sliding scale with meals. 08/22/15   Enobong  Amao, MD  Insulin Syringes, Disposable, U-100 0.5 ML MISC For Lantus and aspart insulin as per instructions 04/08/15   Ripudeep K Rai, MD  ondansetron (ZOFRAN ODT) 4 MG disintegrating tablet Take 1 tablet (4 mg total) by mouth every 8 (eight) hours as needed for nausea. Patient not taking: Reported on 10/22/2015 08/29/15   Mark James, MD  polyvinyl alcohol (LIQUIFILM TEARS) 1.4 % ophthalmic solution Place 3-4 drops into both eyes as needed for dry eyes.    Historical Provider, MD  sildenafil (VIAGRA) 100 MG tablet Take 0.5-1 tablets (50-100 mg total) by mouth daily as  needed for erectile dysfunction. Doses must be taken at least 24 hours apart Patient not taking: Reported on 08/29/2015 08/11/15   Enobong Amao, MD    Physical Exam: Filed Vitals:   10/24/2015 1500 10/31/2015 1501 10/07/2015 1530 10/19/2015 1600  BP: 138/74  142/78 145/75  Pulse:  97 97 99  Temp:   98.5 F (36.9 C)   TempSrc:   Oral   Resp:      Height:      Weight:      SpO2:  97% 100% 97%      Constitutional: NAD, calm, comfortable Filed Vitals:   10/18/2015 1500 10/12/2015 1501 10/07/2015 1530 10/15/2015 1600  BP: 138/74  142/78 145/75  Pulse:  97 97 99  Temp:   98.5 F (36.9 C)   TempSrc:   Oral   Resp:      Height:      Weight:      SpO2:  97% 100% 97%    ENMT: Mucous membranes are moist. Normal dentition.  Neck: normal, supple, no masses, no thyromegaly Respiratory: clear to auscultation bilaterally, no wheezing, no crackles. Normal respiratory effort. No accessory muscle use.  Cardiovascular: Regular rate and rhythm, no murmurs / rubs / gallops. No extremity edema. 2+ pedal pulses. No carotid bruits.  Abdomen: no tenderness, no masses palpated. No hepatosplenomegaly. Bowel sounds positive.  Musculoskeletal: no clubbing / cyanosis. No joint deformity upper and lower extremities. Good ROM, no contractures. Normal muscle tone.  Skin: area on left buttocks firm and tenders 3 inches by 6 inches Neurologic: CN 2-12 grossly intact. Sensation intact, DTR normal. Strength 5/5 in all 4.  Psychiatric: Normal judgment and insight. Alert and oriented x 3. Appears uncomfortable    Labs on Admission: I have personally reviewed following labs and imaging studies  CBC:  Recent Labs Lab 10/07/2015 1318  WBC 22.8*  NEUTROABS 20.7*  HGB 10.6*  HCT 31.6*  MCV 78.6  PLT 453*   Basic Metabolic Panel:  Recent Labs Lab 10/25/2015 1318  NA 130*  K 3.8  CL 99*  CO2 21*  GLUCOSE 291*  BUN 34*  CREATININE 1.93*  CALCIUM 8.1*   GFR: Estimated Creatinine Clearance: 47 mL/min (by C-G  formula based on Cr of 1.93). Liver Function Tests: No results for input(s): AST, ALT, ALKPHOS, BILITOT, PROT, ALBUMIN in the last 168 hours. No results for input(s): LIPASE, AMYLASE in the last 168 hours. No results for input(s): AMMONIA in the last 168 hours. Coagulation Profile: No results for input(s): INR, PROTIME in the last 168 hours. Cardiac Enzymes: No results for input(s): CKTOTAL, CKMB, CKMBINDEX, TROPONINI in the last 168 hours. BNP (last 3 results) No results for input(s): PROBNP in the last 8760 hours. HbA1C: No results for input(s): HGBA1C in the last 72 hours. CBG: No results for input(s): GLUCAP in the last 168 hours. Lipid Profile: No results for input(s): CHOL,   HDL, LDLCALC, TRIG, CHOLHDL, LDLDIRECT in the last 72 hours. Thyroid Function Tests: No results for input(s): TSH, T4TOTAL, FREET4, T3FREE, THYROIDAB in the last 72 hours. Anemia Panel: No results for input(s): VITAMINB12, FOLATE, FERRITIN, TIBC, IRON, RETICCTPCT in the last 72 hours. Urine analysis:    Component Value Date/Time   COLORURINE YELLOW 08/20/2014 1042   APPEARANCEUR CLEAR 08/20/2014 1042   LABSPEC 1.035* 08/20/2014 1042   PHURINE 6.5 08/20/2014 1042   GLUCOSEU >1000* 08/20/2014 1042   HGBUR SMALL* 08/20/2014 1042   BILIRUBINUR NEGATIVE 08/20/2014 1042   KETONESUR NEGATIVE 08/20/2014 1042   PROTEINUR 100* 08/20/2014 1042   UROBILINOGEN 1.0 08/20/2014 1042   NITRITE NEGATIVE 08/20/2014 1042   LEUKOCYTESUR NEGATIVE 08/20/2014 1042   Sepsis Labs: Invalid input(s): PROCALCITONIN, LACTICIDVEN No results found for this or any previous visit (from the past 240 hour(s)).   Radiological Exams on Admission: No results found.    Assessment/Plan Active Problems:   Hyperglycemia due to type 2 diabetes mellitus (HCC)   Hyponatremia   Essential hypertension   Diabetic neuropathy, type II diabetes mellitus (HCC)   Peri-rectal abscess   CKD (chronic kidney disease)   Leukocytosis     Peri-rectal abscess -[facility] to eval tonight-- may need further I/D- NPO after midnight -does not appear to be septic -blood cultures -HIV -pain control  Diarrhea -1 month in duration -check stool studies -may need GI consult for colonoscopy  HTN  Uncontrolled DM -SSI -resume home meds -check HgbA1C  psuedo hyponatremia -corrects with blood sugars  Leukocytosis -monitor -has been on clindamycin - [facility] recommends IV Abx for now- cellulitis order set placed (focus) -culture  CKD -baseline Cr around 1.6-1.8  DVT prophylaxis: lovenox Code Status: full Family Communication: patient Disposition Plan:  Consults called: [facility]- Dr. Wilson Admission status: inpt    U  DO [facility] Pager [phone]  If 7PM-7AM, please contact night-coverage [email or web address] Password [email or web address]  10/30/2015, 5:53 PM    

## 2015-10-26 NOTE — Progress Notes (Addendum)
ANTIBIOTIC CONSULT NOTE - INITIAL  Pharmacy Consult for Vancomycin and Zosyn Indication: cellulitis/ gluteal abscess  Allergies  Allergen Reactions  . Bee Venom Swelling    Throat involvement    Patient Measurements: Height: 5\' 7"  (170.2 cm) Weight: 144 lb (65.318 kg) IBW/kg (Calculated) : 66.1  Vital Signs: Temp: 98.5 F (36.9 C) (04/20 1530) Temp Source: Oral (04/20 1530) BP: 145/75 mmHg (04/20 1600) Pulse Rate: 99 (04/20 1600)  Labs:  Recent Labs  11/04/2015 1318  WBC 22.8*  HGB 10.6*  PLT 453*  CREATININE 1.93*   Estimated Creatinine Clearance: 47 mL/min (by C-G formula based on Cr of 1.93).   Microbiology:  4/20 blood x 2 - sent  4/20 C diff -  4/20 GI panel -  4/20 HIV -  Medical History: Past Medical History  Diagnosis Date  . Diabetes mellitus without complication (HCC)   . Bell's palsy 08/2014   Assessment:  39 yr old male seen in ED on 10/22/15 with intergluteal infection with small abcess. I&D, treated with Clindamycin. No cultures found.  Presented today with worsening pain and pressure. To begin Vanc and Zosyn.  Surgery planning I&D in OR tomorrow.  Tmax 99.7, WBC up to 22.8, lactic acid pending.  Goal of Therapy:  Vancomycin trough level 15-20 mcg/ml appropriate Zosyn dose for renal function  Plan:   Zosyn 3.375 gm IV x 1 over 30 minutes, then 3.375 gm IV q8hrs (each over 4 hrs)  Vancomycin 1 gm IV x 1 as ordered, then 500 mg IV q12hrs.  Will follow renal function, culture data, progress.  Dennie Fetters, [place] Pager: [phone] 10/25/2015,7:32 PM

## 2015-10-27 ENCOUNTER — Encounter (HOSPITAL_COMMUNITY): Admission: EM | Disposition: E | Payer: Self-pay | Source: Home / Self Care | Attending: Family Medicine

## 2015-10-27 ENCOUNTER — Telehealth: Payer: Self-pay

## 2015-10-27 ENCOUNTER — Inpatient Hospital Stay (HOSPITAL_COMMUNITY): Payer: Managed Care, Other (non HMO) | Admitting: Anesthesiology

## 2015-10-27 ENCOUNTER — Encounter (HOSPITAL_COMMUNITY): Payer: Self-pay | Admitting: General Practice

## 2015-10-27 HISTORY — PX: INCISE AND DRAIN ABCESS: PRO64

## 2015-10-27 HISTORY — PX: INCISION AND DRAINAGE ABSCESS: SHX5864

## 2015-10-27 LAB — BASIC METABOLIC PANEL
ANION GAP: 12 (ref 5–15)
BUN: 31 mg/dL — ABNORMAL HIGH (ref 6–20)
CHLORIDE: 103 mmol/L (ref 101–111)
CO2: 19 mmol/L — AB (ref 22–32)
Calcium: 7.7 mg/dL — ABNORMAL LOW (ref 8.9–10.3)
Creatinine, Ser: 1.99 mg/dL — ABNORMAL HIGH (ref 0.61–1.24)
GFR calc Af Amer: 47 mL/min — ABNORMAL LOW (ref >60)
GFR calc non Af Amer: 40 mL/min — ABNORMAL LOW (ref >60)
GLUCOSE: 218 mg/dL — AB (ref 65–99)
POTASSIUM: 3.5 mmol/L (ref 3.5–5.1)
Sodium: 134 mmol/L — ABNORMAL LOW (ref 135–145)

## 2015-10-27 LAB — CBC
HEMATOCRIT: 28.6 % — AB (ref 39.0–52.0)
HEMOGLOBIN: 9.1 g/dL — AB (ref 13.0–17.0)
MCH: 25.3 pg — AB (ref 26.0–34.0)
MCHC: 31.8 g/dL (ref 30.0–36.0)
MCV: 79.4 fL (ref 78.0–100.0)
Platelets: 490 10*3/uL — ABNORMAL HIGH (ref 150–400)
RBC: 3.6 MIL/uL — AB (ref 4.22–5.81)
RDW: 13.5 % (ref 11.5–15.5)
WBC: 24.8 10*3/uL — ABNORMAL HIGH (ref 4.0–10.5)

## 2015-10-27 LAB — SURGICAL PCR SCREEN
MRSA, PCR: NEGATIVE
Staphylococcus aureus: NEGATIVE

## 2015-10-27 LAB — GLUCOSE, CAPILLARY
GLUCOSE-CAPILLARY: 186 mg/dL — AB (ref 65–99)
GLUCOSE-CAPILLARY: 95 mg/dL (ref 65–99)
GLUCOSE-CAPILLARY: 305 mg/dL — AB (ref 65–99)
Glucose-Capillary: 88 mg/dL (ref 65–99)
Glucose-Capillary: 94 mg/dL (ref 65–99)
Glucose-Capillary: 90 mg/dL (ref 65–99)

## 2015-10-27 LAB — HIV ANTIBODY (ROUTINE TESTING W REFLEX): HIV Screen 4th Generation wRfx: NONREACTIVE

## 2015-10-27 SURGERY — INCISION AND DRAINAGE, ABSCESS
Anesthesia: General | Site: Buttocks | Laterality: Left

## 2015-10-27 MED ORDER — PHENYLEPHRINE HCL 10 MG/ML IJ SOLN
INTRAMUSCULAR | Status: DC | PRN
  Administered 2015-10-27 (×2): 80 ug via INTRAVENOUS

## 2015-10-27 MED ORDER — 0.9 % SODIUM CHLORIDE (POUR BTL) OPTIME
TOPICAL | Status: DC | PRN
  Administered 2015-10-27: 1000 mL

## 2015-10-27 MED ORDER — BUPIVACAINE-EPINEPHRINE (PF) 0.25% -1:200000 IJ SOLN
INTRAMUSCULAR | Status: AC
  Filled 2015-10-27: qty 30

## 2015-10-27 MED ORDER — FLUMAZENIL 0.5 MG/5ML IV SOLN
INTRAVENOUS | Status: AC
  Filled 2015-10-27: qty 5

## 2015-10-27 MED ORDER — FENTANYL CITRATE (PF) 250 MCG/5ML IJ SOLN
INTRAMUSCULAR | Status: AC
  Filled 2015-10-27: qty 5

## 2015-10-27 MED ORDER — MEPERIDINE HCL 25 MG/ML IJ SOLN: 6.2500 mg | INTRAMUSCULAR | Status: DC | PRN

## 2015-10-27 MED ORDER — ONDANSETRON HCL 4 MG/2ML IJ SOLN
INTRAMUSCULAR | Status: DC | PRN
  Administered 2015-10-27: 4 mg via INTRAVENOUS

## 2015-10-27 MED ORDER — FENTANYL CITRATE (PF) 100 MCG/2ML IJ SOLN
INTRAMUSCULAR | Status: DC | PRN
  Administered 2015-10-27: 150 ug via INTRAVENOUS

## 2015-10-27 MED ORDER — MIDAZOLAM HCL 5 MG/5ML IJ SOLN
INTRAMUSCULAR | Status: DC | PRN
  Administered 2015-10-27: 2 mg via INTRAVENOUS

## 2015-10-27 MED ORDER — HYDROMORPHONE HCL 1 MG/ML IJ SOLN
INTRAMUSCULAR | Status: AC
  Filled 2015-10-27: qty 1

## 2015-10-27 MED ORDER — FLUMAZENIL 0.5 MG/5ML IV SOLN
INTRAVENOUS | Status: DC | PRN
  Administered 2015-10-27: 0.2 mg via INTRAVENOUS

## 2015-10-27 MED ORDER — MORPHINE SULFATE (PF) 2 MG/ML IV SOLN
2.0000 mg | Freq: Once | INTRAVENOUS | Status: AC
  Administered 2015-10-28: 2 mg via INTRAVENOUS
  Filled 2015-10-27: qty 1

## 2015-10-27 MED ORDER — SUGAMMADEX SODIUM 200 MG/2ML IV SOLN
INTRAVENOUS | Status: DC | PRN
  Administered 2015-10-27: 200 mg via INTRAVENOUS

## 2015-10-27 MED ORDER — INSULIN ASPART 100 UNIT/ML ~~LOC~~ SOLN
5.0000 [IU] | Freq: Once | SUBCUTANEOUS | Status: AC
  Administered 2015-10-27: 5 [IU] via SUBCUTANEOUS

## 2015-10-27 MED ORDER — ONDANSETRON HCL 4 MG/2ML IJ SOLN: 4.0000 mg | Freq: Once | INTRAMUSCULAR | Status: DC | PRN

## 2015-10-27 MED ORDER — MIDAZOLAM HCL 2 MG/2ML IJ SOLN
INTRAMUSCULAR | Status: AC
  Filled 2015-10-27: qty 2

## 2015-10-27 MED ORDER — LACTATED RINGERS IV SOLN
INTRAVENOUS | Status: DC | PRN
Start: 2015-10-27 — End: 2015-10-27
  Administered 2015-10-27: 10:00:00 via INTRAVENOUS

## 2015-10-27 MED ORDER — PROPOFOL 10 MG/ML IV BOLUS
INTRAVENOUS | Status: DC | PRN
  Administered 2015-10-27: 150 mg via INTRAVENOUS

## 2015-10-27 MED ORDER — HYDROMORPHONE HCL 1 MG/ML IJ SOLN
0.2500 mg | INTRAMUSCULAR | Status: DC | PRN
  Administered 2015-10-27 (×2): 0.5 mg via INTRAVENOUS

## 2015-10-27 MED ORDER — ROCURONIUM BROMIDE 100 MG/10ML IV SOLN
INTRAVENOUS | Status: DC | PRN
  Administered 2015-10-27: 35 mg via INTRAVENOUS

## 2015-10-27 MED ORDER — LACTATED RINGERS IV SOLN
INTRAVENOUS | Status: DC
  Administered 2015-10-27: 10:00:00 via INTRAVENOUS

## 2015-10-27 MED ORDER — LIDOCAINE HCL (CARDIAC) 20 MG/ML IV SOLN
INTRAVENOUS | Status: DC | PRN
  Administered 2015-10-27: 70 mg via INTRAVENOUS

## 2015-10-27 MED ORDER — PROPOFOL 10 MG/ML IV BOLUS
INTRAVENOUS | Status: AC
  Filled 2015-10-27: qty 20

## 2015-10-27 MED ORDER — SUGAMMADEX SODIUM 200 MG/2ML IV SOLN
INTRAVENOUS | Status: AC
  Filled 2015-10-27: qty 2

## 2015-10-27 SURGICAL SUPPLY — 33 items
BLADE SURG ROTATE 9660 (MISCELLANEOUS) IMPLANT
BNDG GAUZE ELAST 4 BULKY (GAUZE/BANDAGES/DRESSINGS) IMPLANT
CANISTER SUCTION 2500CC (MISCELLANEOUS) ×3 IMPLANT
COVER SURGICAL LIGHT HANDLE (MISCELLANEOUS) ×3 IMPLANT
DRAIN PENROSE 1/4X12 LTX STRL (WOUND CARE) ×3 IMPLANT
DRAPE LAPAROSCOPIC ABDOMINAL (DRAPES) IMPLANT
DRAPE LAPAROTOMY T 98X78 PEDS (DRAPES) IMPLANT
DRSG PAD ABDOMINAL 8X10 ST (GAUZE/BANDAGES/DRESSINGS) IMPLANT
ELECT CAUTERY BLADE 6.4 (BLADE) ×3 IMPLANT
ELECT REM PT RETURN 9FT ADLT (ELECTROSURGICAL) ×3
ELECTRODE REM PT RTRN 9FT ADLT (ELECTROSURGICAL) ×1 IMPLANT
GAUZE SPONGE 4X4 12PLY STRL (GAUZE/BANDAGES/DRESSINGS) IMPLANT
GLOVE BIOGEL PI IND STRL 7.5 (GLOVE) ×1 IMPLANT
GLOVE BIOGEL PI INDICATOR 7.5 (GLOVE) ×2
GLOVE SURG SS PI 7.0 STRL IVOR (GLOVE) ×3 IMPLANT
GOWN STRL REUS W/ TWL LRG LVL3 (GOWN DISPOSABLE) ×2 IMPLANT
GOWN STRL REUS W/TWL LRG LVL3 (GOWN DISPOSABLE) ×4
KIT BASIN OR (CUSTOM PROCEDURE TRAY) ×3 IMPLANT
KIT ROOM TURNOVER OR (KITS) ×3 IMPLANT
NS IRRIG 1000ML POUR BTL (IV SOLUTION) ×3 IMPLANT
PACK SURGICAL SETUP 50X90 (CUSTOM PROCEDURE TRAY) ×3 IMPLANT
PAD ABD 8X10 STRL (GAUZE/BANDAGES/DRESSINGS) ×3 IMPLANT
PAD ARMBOARD 7.5X6 YLW CONV (MISCELLANEOUS) ×3 IMPLANT
PENCIL BUTTON HOLSTER BLD 10FT (ELECTRODE) ×3 IMPLANT
SUT SILK 3 0 (SUTURE) ×2
SUT SILK 3-0 18XBRD TIE 12 (SUTURE) ×1 IMPLANT
SWAB COLLECTION DEVICE MRSA (MISCELLANEOUS) ×3 IMPLANT
TOWEL OR 17X24 6PK STRL BLUE (TOWEL DISPOSABLE) ×3 IMPLANT
TOWEL OR 17X26 10 PK STRL BLUE (TOWEL DISPOSABLE) ×3 IMPLANT
TUBE ANAEROBIC SPECIMEN COL (MISCELLANEOUS) ×3 IMPLANT
TUBE CONNECTING 12'X1/4 (SUCTIONS) ×1
TUBE CONNECTING 12X1/4 (SUCTIONS) ×2 IMPLANT
YANKAUER SUCT BULB TIP NO VENT (SUCTIONS) ×3 IMPLANT

## 2015-10-27 NOTE — Progress Notes (Signed)
PROGRESS NOTE    Shane Dougherty  EXB:[id number] DOB: 01/11/1976 DOA: 10/21/2015 PCP: Jaclyn Shaggy, MD   Outpatient Specialists:      Brief Narrative:  Shane Dougherty is a 40 y.o. male with medical history significant of diabetes. He complains for diarrhea x 1 month. Nothing made better or worse. No history of crohn's/UC- no family hx either. He then a few days before 4/16 developed left buttock pain. He was seen in the ER where he was diagnosed with intergluteal infection with small abscess. An I/D was done that some purulent drainage. He was given norco, clindamycin and told to do warm soaks.  He presented back at [facility] [place] with worsening pain and pressure. Dr. Ethelda Chick spoke with Dr. Sheliah Hatch who requested hospitalist transfer to [facility] for possible further intervention. He recommended no abx.    Assessment & Plan:   Active Problems:   Hyperglycemia due to type 2 diabetes mellitus (HCC)   Hyponatremia   Essential hypertension   Diabetic neuropathy, type II diabetes mellitus (HCC)   Peri-rectal abscess   CKD (chronic kidney disease)   Leukocytosis   Peri-rectal abscess -plan for I&D today at 10 AM -blood cultures -HIV -pain control - IV Abx- vanc and zosyn  Diarrhea -1 month in duration -check stool studies- no further stools since admission -may need GI consult for colonoscopy as outpatient  HTN -stable  Uncontrolled DM -SSI -resume home meds -check HgbA1C  psuedo hyponatremia -improving  Leukocytosis -monitor -has been on clindamycin - [facility] recommends IV Abx for now- cellulitis order set placed (focus) -culture  CKD -baseline Cr around 1.6-1.8   DVT prophylaxis:  Lovenox  Code Status: Full Code   Family Communication:   Disposition Plan:     Consultants:   CCS  Procedures:        Subjective: C/o no appetite  Objective: Filed Vitals:   10/10/2015 1600 10/13/2015 1800 10/13/2015 2151 10/30/2015 0500  BP: 145/75  139/72 128/63 122/72  Pulse: 99 101 95 84  Temp:  99.7 F (37.6 C) 99 F (37.2 C) 99.3 F (37.4 C)  TempSrc:  Oral Oral Oral  Resp:  18 18 18   Height:  5\' 7"  (1.702 m)    Weight:  67.132 kg (148 lb)    SpO2: 97% 100% 99% 100%    Intake/Output Summary (Last 24 hours) at 10/19/2015 0802 Last data filed at 10/11/2015 0643  Gross per 24 hour  Intake 1827.08 ml  Output    750 ml  Net 1077.08 ml   Filed Weights   10/07/2015 1154 10/24/2015 1800  Weight: 65.318 kg (144 lb) 67.132 kg (148 lb)    Examination:  General exam: Appears calm and comfortable  Respiratory system: Clear to auscultation. Respiratory effort normal. Cardiovascular system: S1 & S2 heard, RRR. No JVD, murmurs, rubs, gallops or clicks. No pedal edema. Gastrointestinal system: Abdomen is nondistended, soft and nontender. No organomegaly or masses felt. Normal bowel sounds heard. Psychiatry: Judgement and insight appear normal. Mood & affect appropriate.     Data Reviewed: I have personally reviewed following labs and imaging studies  CBC:  Recent Labs Lab 10/20/2015 1318 10/30/2015 1933 11/03/2015 0547  WBC 22.8* 24.5* 24.8*  NEUTROABS 20.7*  --   --   HGB 10.6* 9.8* 9.1*  HCT 31.6* 30.2* 28.6*  MCV 78.6 79.1 79.4  PLT 453* 541* 490*   Basic Metabolic Panel:  Recent Labs Lab 10/15/2015 1318 10/15/2015 1933 10/10/2015 0547  NA 130*  --  134*  K 3.8  --  3.5  CL 99*  --  103  CO2 21*  --  19*  GLUCOSE 291*  --  218*  BUN 34*  --  31*  CREATININE 1.93* 1.96* 1.99*  CALCIUM 8.1*  --  7.7*   GFR: Estimated Creatinine Clearance: 46.1 mL/min (by C-G formula based on Cr of 1.99). Liver Function Tests: No results for input(s): AST, ALT, ALKPHOS, BILITOT, PROT, ALBUMIN in the last 168 hours. No results for input(s): LIPASE, AMYLASE in the last 168 hours. No results for input(s): AMMONIA in the last 168 hours. Coagulation Profile: No results for input(s): INR, PROTIME in the last 168 hours. Cardiac Enzymes: No  results for input(s): CKTOTAL, CKMB, CKMBINDEX, TROPONINI in the last 168 hours. BNP (last 3 results) No results for input(s): PROBNP in the last 8760 hours. HbA1C: No results for input(s): HGBA1C in the last 72 hours. CBG:  Recent Labs Lab 10/28/2015 1827 10/29/2015 2127 10/09/2015 0726  GLUCAP 229* 291* 186*   Lipid Profile: No results for input(s): CHOL, HDL, LDLCALC, TRIG, CHOLHDL, LDLDIRECT in the last 72 hours. Thyroid Function Tests: No results for input(s): TSH, T4TOTAL, FREET4, T3FREE, THYROIDAB in the last 72 hours. Anemia Panel: No results for input(s): VITAMINB12, FOLATE, FERRITIN, TIBC, IRON, RETICCTPCT in the last 72 hours. Urine analysis:    Component Value Date/Time   COLORURINE YELLOW 08/20/2014 1042   APPEARANCEUR CLEAR 08/20/2014 1042   LABSPEC 1.035* 08/20/2014 1042   PHURINE 6.5 08/20/2014 1042   GLUCOSEU >1000* 08/20/2014 1042   HGBUR SMALL* 08/20/2014 1042   BILIRUBINUR NEGATIVE 08/20/2014 1042   KETONESUR NEGATIVE 08/20/2014 1042   PROTEINUR 100* 08/20/2014 1042   UROBILINOGEN 1.0 08/20/2014 1042   NITRITE NEGATIVE 08/20/2014 1042   LEUKOCYTESUR NEGATIVE 08/20/2014 1042   No results found for this or any previous visit (from the past 240 hour(s)).    Anti-infectives    Start     Dose/Rate Route Frequency Ordered Stop   10/20/2015 0800  vancomycin (VANCOCIN) 500 mg in sodium chloride 0.9 % 100 mL IVPB     500 mg 100 mL/hr over 60 Minutes Intravenous Every 12 hours 11/01/2015 1931     10/19/2015 0200  piperacillin-tazobactam (ZOSYN) IVPB 3.375 g     3.375 g 12.5 mL/hr over 240 Minutes Intravenous Every 8 hours 10/13/2015 1938     10/30/2015 2000  piperacillin-tazobactam (ZOSYN) IVPB 3.375 g     3.375 g 100 mL/hr over 30 Minutes Intravenous  Once 10/09/2015 1903 11/04/2015 2117   10/23/2015 2000  vancomycin (VANCOCIN) IVPB 1000 mg/200 mL premix     1,000 mg 200 mL/hr over 60 Minutes Intravenous  Once 10/28/2015 1903 11/05/2015 2147       Radiology Studies: No  results found.      Scheduled Meds: . sodium chloride   Intravenous STAT  . heparin  5,000 Units Subcutaneous Q8H  . insulin aspart  0-9 Units Subcutaneous TID WC  . insulin glargine  25 Units Subcutaneous QHS  . piperacillin-tazobactam (ZOSYN)  IV  3.375 g Intravenous Q8H  . vancomycin  500 mg Intravenous Q12H   Continuous Infusions: . sodium chloride 125 mL/hr at 10/22/2015 1548     LOS: 1 day    Time spent: 25 min    Jaray Boliver U Earlin Sweeden, DO [facility] Pager [phone]  If 7PM-7AM, please contact night-coverage [email or web address] Password [email or web address] 10/13/2015, 8:02 AM

## 2015-10-27 NOTE — Anesthesia Postprocedure Evaluation (Signed)
Anesthesia Post Note  Patient: Shane Dougherty  Procedure(s) Performed: Procedure(s) (LRB): INCISION AND DRAINAGE ABSCESS/GLUTEAL (Left)  Patient location during evaluation: PACU Anesthesia Type: General Level of consciousness: awake and alert and oriented Pain management: pain level controlled Vital Signs Assessment: post-procedure vital signs reviewed and stable Respiratory status: spontaneous breathing, nonlabored ventilation and respiratory function stable Cardiovascular status: blood pressure returned to baseline and stable Postop Assessment: no signs of nausea or vomiting Anesthetic complications: no    Last Vitals:  Filed Vitals:   10/11/2015 1324 11/01/2015 1339  BP: 128/75   Pulse: 110   Temp: 38.4 C 37.6 C  Resp: 15     Last Pain:  Filed Vitals:   10/17/2015 1340  PainSc: 2                  Kentrel Clevenger A.

## 2015-10-27 NOTE — Anesthesia Procedure Notes (Signed)
Procedure Name: Intubation Date/Time: 10/22/2015 10:48 AM Performed by: Marena Chancy Pre-anesthesia Checklist: Emergency Drugs available, Patient identified, Timeout performed, Suction available and Patient being monitored Patient Re-evaluated:Patient Re-evaluated prior to inductionOxygen Delivery Method: Circle system utilized Preoxygenation: Pre-oxygenation with 100% oxygen Intubation Type: IV induction Ventilation: Mask ventilation without difficulty Laryngoscope Size: Miller and 2 Grade View: Grade I Tube size: 7.5 mm Number of attempts: 1 Placement Confirmation: ETT inserted through vocal cords under direct vision,  breath sounds checked- equal and bilateral and positive ETCO2 Tube secured with: Tape Dental Injury: Teeth and Oropharynx as per pre-operative assessment

## 2015-10-27 NOTE — Anesthesia Preprocedure Evaluation (Addendum)
Anesthesia Evaluation  Patient identified by MRN, date of birth, ID band Patient awake    Reviewed: Allergy & Precautions, NPO status , Patient's Chart, lab work & pertinent test results  Airway Mallampati: II  TM Distance: >3 FB Neck ROM: Full    Dental no notable dental hx. (+) Teeth Intact   Pulmonary neg pulmonary ROS,    Pulmonary exam normal breath sounds clear to auscultation       Cardiovascular Exercise Tolerance: Good hypertension, Normal cardiovascular exam Rhythm:Regular Rate:Normal     Neuro/Psych  Neuromuscular disease negative psych ROS   GI/Hepatic Neg liver ROS, ? Perirectal abscess   Endo/Other  diabetes, Poorly Controlled, Type 1, Insulin Dependent  Renal/GU Renal InsufficiencyRenal disease  negative genitourinary   Musculoskeletal Left gluteal abscess   Abdominal   Peds  Hematology  (+) anemia ,   Anesthesia Other Findings   Reproductive/Obstetrics                            Anesthesia Physical Anesthesia Plan  ASA: II  Anesthesia Plan: General   Post-op Pain Management:    Induction: Intravenous  Airway Management Planned: Oral ETT  Additional Equipment:   Intra-op Plan:   Post-operative Plan: Extubation in OR  Informed Consent: I have reviewed the patients History and Physical, chart, labs and discussed the procedure including the risks, benefits and alternatives for the proposed anesthesia with the patient or authorized representative who has indicated his/her understanding and acceptance.   Dental advisory given  Plan Discussed with: CRNA, Anesthesiologist and Surgeon  Anesthesia Plan Comments:         Anesthesia Quick Evaluation

## 2015-10-27 NOTE — Op Note (Signed)
Preoperative diagnosis: left gluteal abscess  Postoperative diagnosis: left gluteal abscess   Procedure: incision and drainage of left gluteal abscess  Surgeon: Feliciana Rossetti, M.D.  Asst: none  Anesthesia: General  Indications for procedure: Shane Dougherty is a 40 y.o. year old male with symptoms of pain and drainage at left gluteus, he underwent previous drain in ER a few days ago without resolution. Due to continued drainage and leukocytosis we decided to proceed with exploration and incision.  Description of procedure: The patient was brought into the operative suite. Anesthesia was administered with General endotracheal anesthesia. WHO checklist was applied. The patient was then placed in prone/jack-knife. The area was prepped and draped in the usual sterile fashion.  The previous cavity was probed, there was pus draining from the lateral edge. This was collected and sent for culture. Next the superior opening of the abscess was found and a counter incision made. A 1/4" penrose drain was inserted and tied in a loop fashion. The tract was irrigated with a large amount of saline. The opening had some yellow dead tissue, this was sharply debrided. No other tracts were found on final palpation. A dressing was put in place and the patient awoke from anesthesia and was brought to pacu in stable condition  Findings: abscess  Specimen: gluteal abscess  Implant: 1/4" penrose drain  Blood loss: <60ml  Local anesthesia: 0 ml 0.25% marcaine with epinephrine  Complications: none  Feliciana Rossetti, M.D. General, Bariatric, & Minimally Invasive Surgery [facility]

## 2015-10-27 NOTE — Transfer of Care (Signed)
Immediate Anesthesia Transfer of Care Note  Patient: Shane Dougherty  Procedure(s) Performed: Procedure(s): INCISION AND DRAINAGE ABSCESS/GLUTEAL (Left)  Patient Location: PACU  Anesthesia Type:General  Level of Consciousness: awake, alert  and oriented  Airway & Oxygen Therapy: Patient Spontanous Breathing and Patient connected to nasal cannula oxygen  Post-op Assessment: Report given to RN, Post -op Vital signs reviewed and stable and Patient moving all extremities X 4  Post vital signs: Reviewed and stable  Last Vitals:  Filed Vitals:   10/08/2015 0500 10/30/2015 1200  BP: 122/72 135/86  Pulse: 84 102  Temp: 37.4 C 37.7 C  Resp: 18 15    Complications: No apparent anesthesia complications

## 2015-10-27 NOTE — Hospital Discharge Follow-Up (Signed)
[employer]:  This Case Manager received voicemail from Shane Dougherty, [facility] Case Manager [phone]), regarding patient. She indicated patient does not have a glucometer or diabetes testing supplies. She indicated One Touch Contour or Freestyle preferred by patient's insurance. She also indicated patient will need referral to Endocrinologist and requested updated medication list; however, patient currently inpatient. Request for Endocrinology referral discussed with Dr. Jarold Song who indicated she will need to see patient after discharge.   Patient currently receiving ongoing inpatient treatment for peri-rectal abscess, diarrhea, uncontrolled diabetes, pseudo hyponatremia, leukocytosis. This Case Manager met with patient at bedside. Patient agreeable to hospital follow-up appointment with Dr. Jarold Song, and appointment scheduled for 11/19/2015 at 1200. AVS updated. Patient also indicated he would like this Case Manager to follow-up with Shane Dougherty, [employer] Case Manager. Informed Shane Sarin, RN CM that patient needing glucometer and diabetes testing supplies and preferred meters. She indicated she would update Hospitalist.  This Case Manager placed call to Shane Dougherty, [employer] Case Manager, to provide update; however, unable to reach. Voicemail left requesting return call.

## 2015-10-27 NOTE — Telephone Encounter (Signed)
This Case Manager received return call from Katrina Stack, [employer] Case Manager.  This Case Manager informed her that patient has a hospital follow-up appointment scheduled with Dr. Venetia Night on 11/09/2015 at 1200. Also informed her that inpatient RN CM was informed of covered glucometers and testing supplies. She was appreciative of information. This Case Manager will continue to follow patient's progress closely.

## 2015-10-27 NOTE — Care Management Note (Addendum)
Case Management Note  Patient Details  Name: Shane Dougherty MRN: [id number] Date of Birth: [birth year]  Subjective/Objective:                    Action/Plan: Katrina Stack RN CM at [facility] called . Patient does not have a glucose meter .  Ms [name] requesting MD at discharge to provide patient with a prescription for One Touch Contour or Free Style Appropriate meter .   Thanks     Expected Discharge Date:               Expected Discharge Plan:     In-House Referral:     Discharge planning Services     Post Acute Care Choice:    Choice offered to:     DME Arranged:    DME Agency:     HH Arranged:    HH Agency:     Status of Service:  In process, will continue to follow  Medicare Important Message Given:    Date Medicare IM Given:    Medicare IM give by:    Date Additional Medicare IM Given:    Additional Medicare Important Message give by:     If discussed at Long Length of Stay Meetings, dates discussed:    Additional Comments:  Kingsley Plan, RN 11/18/15, 2:28 PM

## 2015-10-28 ENCOUNTER — Inpatient Hospital Stay (HOSPITAL_COMMUNITY): Payer: Managed Care, Other (non HMO)

## 2015-10-28 LAB — BASIC METABOLIC PANEL
Anion gap: 13 (ref 5–15)
BUN: 35 mg/dL — AB (ref 6–20)
CHLORIDE: 103 mmol/L (ref 101–111)
CO2: 17 mmol/L — AB (ref 22–32)
CREATININE: 3.01 mg/dL — AB (ref 0.61–1.24)
Calcium: 7.5 mg/dL — ABNORMAL LOW (ref 8.9–10.3)
GFR calc Af Amer: 28 mL/min — ABNORMAL LOW (ref >60)
GFR calc non Af Amer: 24 mL/min — ABNORMAL LOW (ref >60)
GLUCOSE: 92 mg/dL (ref 65–99)
Potassium: 3.5 mmol/L (ref 3.5–5.1)
Sodium: 133 mmol/L — ABNORMAL LOW (ref 135–145)

## 2015-10-28 LAB — CBC
HEMATOCRIT: 26.4 % — AB (ref 39.0–52.0)
Hemoglobin: 8.3 g/dL — ABNORMAL LOW (ref 13.0–17.0)
MCH: 25.2 pg — ABNORMAL LOW (ref 26.0–34.0)
MCHC: 31.4 g/dL (ref 30.0–36.0)
MCV: 80.2 fL (ref 78.0–100.0)
Platelets: 436 10*3/uL — ABNORMAL HIGH (ref 150–400)
RBC: 3.29 MIL/uL — ABNORMAL LOW (ref 4.22–5.81)
RDW: 13.8 % (ref 11.5–15.5)
WBC: 33.9 10*3/uL — AB (ref 4.0–10.5)

## 2015-10-28 LAB — HEMOGLOBIN A1C
Hgb A1c MFr Bld: 14.6 % — ABNORMAL HIGH (ref 4.8–5.6)
Mean Plasma Glucose: 372 mg/dL

## 2015-10-28 LAB — GLUCOSE, CAPILLARY
GLUCOSE-CAPILLARY: 116 mg/dL — AB (ref 65–99)
Glucose-Capillary: 88 mg/dL (ref 65–99)
Glucose-Capillary: 128 mg/dL — ABNORMAL HIGH (ref 65–99)
Glucose-Capillary: 120 mg/dL — ABNORMAL HIGH (ref 65–99)

## 2015-10-28 LAB — LACTIC ACID, PLASMA: Lactic Acid, Venous: 0.8 mmol/L (ref 0.5–2.0)

## 2015-10-28 MED ORDER — DOCUSATE SODIUM 100 MG PO CAPS
100.0000 mg | ORAL_CAPSULE | Freq: Two times a day (BID) | ORAL | Status: DC
  Administered 2015-10-28 – 2015-11-13 (×27): 100 mg via ORAL
  Filled 2015-10-28 (×32): qty 1

## 2015-10-28 MED ORDER — VANCOMYCIN HCL IN DEXTROSE 750-5 MG/150ML-% IV SOLN
750.0000 mg | INTRAVENOUS | Status: DC
Start: 2015-10-29 — End: 2015-10-31
  Administered 2015-10-29 – 2015-10-31 (×3): 750 mg via INTRAVENOUS
  Filled 2015-10-28 (×3): qty 150

## 2015-10-28 MED ORDER — TAMSULOSIN HCL 0.4 MG PO CAPS
0.4000 mg | ORAL_CAPSULE | Freq: Every day | ORAL | Status: DC
  Administered 2015-10-28 – 2015-10-29 (×2): 0.4 mg via ORAL
  Filled 2015-10-28 (×2): qty 1

## 2015-10-28 MED ORDER — SODIUM CHLORIDE 0.9 % IV BOLUS (SEPSIS)
1000.0000 mL | Freq: Once | INTRAVENOUS | Status: AC
  Administered 2015-10-28: 1000 mL via INTRAVENOUS

## 2015-10-28 NOTE — Evaluation (Signed)
Physical Therapy Evaluation Patient Details Name: Shane Dougherty MRN: [id number] DOB: Jul 08, 1976 Today's Date: 10/28/2015   History of Present Illness  Pt is a 40 y/o male who presents s/p I&D of L gluteal abscess. Pt had been seen in ED a few days prior to admission and also had an I&D done at that time.PMH significant for DM.   Clinical Impression  Pt admitted with above diagnosis. Pt currently with functional limitations due to the deficits listed below (see PT Problem List). At the time of PT eval pt lethargic but able to participate with mobility. Reports that his "legs felt weak since surgery" and demonstrated 2/5 strength in BLE's. Pt was not able to achieve sitting EOB as he reports sudden dizziness and nausea as soon as he sat up, and immediately returned to sidelying on bed. Pt will benefit from skilled PT to increase their independence and safety with mobility to allow discharge to the venue listed below.       Follow Up Recommendations CIR;Supervision/Assistance - 24 hour    Equipment Recommendations  Rolling walker with 5" wheels;3in1 (PT)    Recommendations for Other Services Rehab consult     Precautions / Restrictions Precautions Precautions: Fall Restrictions Weight Bearing Restrictions: No      Mobility  Bed Mobility Overal bed mobility: Needs Assistance Bed Mobility: Supine to Sit     Supine to sit: Max assist;HOB elevated     General bed mobility comments: Pt required max assist for LE movement towards EOB, and pt was cued for use of railings for support. Pt was able to assist minimally in scooting and bed pad was used to complete scooting activity to EOB. Max assist provided to elevate trunk to full sitting position, and pt was unable to maintain, stating he was dizzy and nauseated. Pt was then returned to supine.   Transfers                 General transfer comment: Unable  Ambulation/Gait             General Gait Details:  Unable  Stairs            Wheelchair Mobility    Modified Rankin (Stroke Patients Only)       Balance Overall balance assessment: Needs assistance Sitting-balance support: Feet supported;Bilateral upper extremity supported Sitting balance-Leahy Scale: Poor   Postural control: Posterior lean                                   Pertinent Vitals/Pain Pain Assessment: Faces Faces Pain Scale: Hurts a little bit Pain Location: Pt unable to specify if/where he was having pain. Noted grimacing with attempts at mobility.  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 Pt was unable to perform heel slide actively, and with active assist, he was unable to maintain knee at midline (was abducting). Pt reports that both of his legs have felt extremely weak since his surgery.     Cervical / Trunk Assessment:  (Unable to assess)  Communication   Communication: No difficulties  Cognition Arousal/Alertness: Lethargic Behavior During Therapy: Flat affect Overall Cognitive Status: Within Functional Limits for tasks assessed                      General Comments      Exercises         Assessment/Plan    PT Assessment Patient needs continued PT services  PT Diagnosis Difficulty walking;Generalized weakness   PT Problem List Decreased strength;Decreased range of motion;Decreased activity tolerance;Decreased balance;Decreased mobility;Decreased knowledge of use of DME;Decreased safety awareness;Decreased knowledge of precautions  PT Treatment Interventions DME instruction;Gait training;Stair training;Functional mobility training;Therapeutic activities;Therapeutic exercise;Neuromuscular re-education;Patient/family education   PT Goals (Current goals can be found in the Care Plan section) Acute Rehab PT Goals Patient Stated Goal: Pt did not state goals during session PT Goal Formulation: With patient Time For Goal Achievement: 11/11/15 Potential to Achieve Goals: Good    Frequency Min 3X/week   Barriers to discharge        Co-evaluation               End of Session   Activity Tolerance: Patient limited by fatigue;Patient limited by lethargy Patient left: in bed;with call bell/phone within reach;with bed alarm set Nurse Communication: Mobility status         Time: 1141-1158 PT Time Calculation (min) (ACUTE ONLY): 17 min   Charges:   PT Evaluation $PT Eval Moderate Complexity: 1 Procedure     PT G Codes:        Conni Slipper 2015/11/18, 2:05 PM   Conni Slipper, PT, DPT Acute Rehabilitation Services Pager: [phone]

## 2015-10-28 NOTE — Progress Notes (Signed)
ANTIBIOTIC CONSULT NOTE - Follow Up  Pharmacy Consult for Vancomycin and Zosyn Indication: cellulitis/ gluteal abscess  Allergies  Allergen Reactions  . Bee Venom Swelling    Throat involvement    Patient Measurements: Height:  (170.2 cm) Weight: 148 lb (67.132 kg) IBW/kg (Calculated) : 66.1  Vital Signs: Temp: 97.8 F (36.6 C) (04/22 0545) Temp Source: Oral (04/22 0545) BP: 118/60 mmHg (04/22 0545) Pulse Rate: 84 (04/22 0545)  Labs:  Recent Labs  11/03/2015 1933 11/01/2015 0547 10/28/15 0552  WBC 24.5* 24.8* 33.9*  HGB 9.8* 9.1* 8.3*  PLT 541* 490* 436*  CREATININE 1.96* 1.99* 3.01*   Estimated Creatinine Clearance: 30.5 mL/min (by C-G formula based on Cr of 3.01).   Microbiology:  4/20 blood x 2 - sent  4/20 C diff -  4/20 GI panel -  4/20 HIV -  Medical History: Past Medical History  Diagnosis Date  . Diabetes mellitus without complication (HCC)   . Bell's palsy 08/2014  . CKD (chronic kidney disease)    Assessment:  40 yr old male seen in ED on 10/22/15 with intergluteal infection with small abcess. I&D, treated with Clindamycin. No cultures found.  Presented today with worsening pain and pressure. Started on IV Vancomycin and Zosyn. S/p I&D on 4/21. Renal fx has worsened today. CrCl ~ 30 mL/min   Tmax 101.1, WBC up to 33.9, lactic acid pending.  Goal of Therapy:  Vancomycin trough level 15-20 mcg/ml appropriate Zosyn dose for renal function  Plan:   Zosyn 3.375 gm IV q8hrs (each over 4 hrs)  Decrease Vancomycin 750 mg IV Q 24 hours  Will follow renal function, culture data, progress.  Vinnie Level, PharmD., BCPS Clinical Pharmacist Pager [phone]

## 2015-10-28 NOTE — Progress Notes (Signed)
PROGRESS NOTE    Shane Dougherty  ZOX:[id number] DOB: 07/28/1975 DOA: 10/15/2015 PCP: Jaclyn Shaggy, MD   Outpatient Specialists:      Brief Narrative:  Shane Dougherty is a 40 y.o. male with medical history significant of diabetes. He complains for diarrhea x 1 month. Nothing made better or worse. No history of crohn's/UC- no family hx either. He then a few days before 4/16 developed left buttock pain. He was seen in the ER where he was diagnosed with intergluteal infection with small abscess. An I/D was done that some purulent drainage. He was given norco, clindamycin and told to do warm soaks.  He presented back at [facility] [place] with worsening pain and pressure. tx to [facility] for further I&D in operating room    Assessment & Plan:   Active Problems:   Hyperglycemia due to type 2 diabetes mellitus (HCC)   Hyponatremia   Essential hypertension   Diabetic neuropathy, type II diabetes mellitus (HCC)   Peri-rectal abscess   CKD (chronic kidney disease)   Leukocytosis   Peri-rectal abscess -s/p I&D -blood cultures- NGTD -HIV negative -pain control -bowel regimen - IV Abx- vanc and zosyn until cx back  Diarrhea -1 month in duration no further stools since admission -may need GI consult for colonoscopy as outpatient if continues  HTN -stable  Uncontrolled DM -SSI -resume home meds -HgbA1C 14.6 which is down from 6 months ago (16.8)   Leukocytosis -monitor -has been on clindamycin - IV abx -culture  Acute kidney injury  on CKD stage 3 -baseline Cr around 1.6-1.8 -IVF -had I/O cath last PM-- place foley- start flomax -renal U/S -recheck in AM, if not better- will get nephrology consult   DVT prophylaxis:  Lovenox  Code Status: Full Code   Family Communication:   Disposition Plan:     Consultants:   CCS  Procedures:   I&D     Subjective: Ordering breakfast Pain tolerable Had I/O cath last night for urinary  retention  Objective: Filed Vitals:   2015-11-23 1339 2015-11-23 2214 10/28/15 0326 10/28/15 0545  BP:  114/65 108/63 118/60  Pulse:  90 83 84  Temp: 99.6 F (37.6 C) 98.6 F (37 C) 97.9 F (36.6 C) 97.8 F (36.6 C)  TempSrc: Oral Oral Oral Oral  Resp:  16 18 18   Height:      Weight:      SpO2:  99% 97% 97%    Intake/Output Summary (Last 24 hours) at 10/28/15 0835 Last data filed at 10/28/15 0807  Gross per 24 hour  Intake   3797 ml  Output   1405 ml  Net   2392 ml   Filed Weights   11/05/2015 1154 10/29/2015 1800  Weight: 65.318 kg (144 lb) 67.132 kg (148 lb)    Examination:  General exam: Appears calm and comfortable  Respiratory system: Clear to auscultation. Respiratory effort normal. Cardiovascular system: S1 & S2 heard, RRR. No JVD, murmurs, rubs, gallops or clicks. No pedal edema. Gastrointestinal system: Abdomen is nondistended, soft and nontender. No organomegaly or masses felt. Normal bowel sounds heard. Psychiatry: Judgement and insight appear normal. Mood & affect appropriate.     Data Reviewed: I have personally reviewed following labs and imaging studies  CBC:  Recent Labs Lab 11/05/2015 1318 10/23/2015 1933 11-23-15 0547 10/28/15 0552  WBC 22.8* 24.5* 24.8* 33.9*  NEUTROABS 20.7*  --   --   --   HGB 10.6* 9.8* 9.1* 8.3*  HCT 31.6* 30.2* 28.6*  26.4*  MCV 78.6 79.1 79.4 80.2  PLT 453* 541* 490* 436*   Basic Metabolic Panel:  Recent Labs Lab 10/17/2015 1318 10/16/2015 1933 10/24/2015 0547 10/28/15 0552  NA 130*  --  134* 133*  K 3.8  --  3.5 3.5  CL 99*  --  103 103  CO2 21*  --  19* 17*  GLUCOSE 291*  --  218* 92  BUN 34*  --  31* 35*  CREATININE 1.93* 1.96* 1.99* 3.01*  CALCIUM 8.1*  --  7.7* 7.5*   GFR: Estimated Creatinine Clearance: 30.5 mL/min (by C-G formula based on Cr of 3.01). Liver Function Tests: No results for input(s): AST, ALT, ALKPHOS, BILITOT, PROT, ALBUMIN in the last 168 hours. No results for input(s): LIPASE, AMYLASE in the  last 168 hours. No results for input(s): AMMONIA in the last 168 hours. Coagulation Profile: No results for input(s): INR, PROTIME in the last 168 hours. Cardiac Enzymes: No results for input(s): CKTOTAL, CKMB, CKMBINDEX, TROPONINI in the last 168 hours. BNP (last 3 results) No results for input(s): PROBNP in the last 8760 hours. HbA1C:  Recent Labs  10/29/2015 1933  HGBA1C 14.6*   CBG:  Recent Labs Lab 10/16/2015 1028 10/23/2015 1150 10/16/2015 1703 10/29/2015 2216 10/28/15 0718  GLUCAP 88 90 95 305* 88   Lipid Profile: No results for input(s): CHOL, HDL, LDLCALC, TRIG, CHOLHDL, LDLDIRECT in the last 72 hours. Thyroid Function Tests: No results for input(s): TSH, T4TOTAL, FREET4, T3FREE, THYROIDAB in the last 72 hours. Anemia Panel: No results for input(s): VITAMINB12, FOLATE, FERRITIN, TIBC, IRON, RETICCTPCT in the last 72 hours. Urine analysis:    Component Value Date/Time   COLORURINE YELLOW 08/20/2014 1042   APPEARANCEUR CLEAR 08/20/2014 1042   LABSPEC 1.035* 08/20/2014 1042   PHURINE 6.5 08/20/2014 1042   GLUCOSEU >1000* 08/20/2014 1042   HGBUR SMALL* 08/20/2014 1042   BILIRUBINUR NEGATIVE 08/20/2014 1042   KETONESUR NEGATIVE 08/20/2014 1042   PROTEINUR 100* 08/20/2014 1042   UROBILINOGEN 1.0 08/20/2014 1042   NITRITE NEGATIVE 08/20/2014 1042   LEUKOCYTESUR NEGATIVE 08/20/2014 1042   Recent Results (from the past 240 hour(s))  Culture, blood (Routine X 2) w Reflex to ID Panel     Status: None (Preliminary result)   Collection Time: 10/25/2015  7:52 PM  Result Value Ref Range Status   Specimen Description BLOOD LEFT ANTECUBITAL  Final   Special Requests BOTTLES DRAWN AEROBIC ONLY 10CC  Final   Culture NO GROWTH < 24 HOURS  Final   Report Status PENDING  Incomplete  Culture, blood (Routine X 2) w Reflex to ID Panel     Status: None (Preliminary result)   Collection Time: 10/21/2015  8:00 PM  Result Value Ref Range Status   Specimen Description BLOOD LEFT FOREARM   Final   Special Requests BOTTLES DRAWN AEROBIC ONLY 9CC  Final   Culture NO GROWTH < 24 HOURS  Final   Report Status PENDING  Incomplete  Surgical pcr screen     Status: None   Collection Time: 10/28/2015  8:27 AM  Result Value Ref Range Status   MRSA, PCR NEGATIVE NEGATIVE Final   Staphylococcus aureus NEGATIVE NEGATIVE Final    Comment:        The Xpert SA Assay (FDA approved for NASAL specimens in patients over 68 years of age), is one component of a comprehensive surveillance program.  Test performance has been validated by [facility] for patients greater than or equal to 97 year old. It  is not intended to diagnose infection nor to guide or monitor treatment.       Anti-infectives    Start     Dose/Rate Route Frequency Ordered Stop   10/24/2015 0800  vancomycin (VANCOCIN) 500 mg in sodium chloride 0.9 % 100 mL IVPB     500 mg 100 mL/hr over 60 Minutes Intravenous Every 12 hours 10/16/2015 1931     10/28/2015 0200  piperacillin-tazobactam (ZOSYN) IVPB 3.375 g     3.375 g 12.5 mL/hr over 240 Minutes Intravenous Every 8 hours 10/19/2015 1938     10/14/2015 2000  piperacillin-tazobactam (ZOSYN) IVPB 3.375 g     3.375 g 100 mL/hr over 30 Minutes Intravenous  Once 10/29/2015 1903 10/11/2015 2117   11/02/2015 2000  vancomycin (VANCOCIN) IVPB 1000 mg/200 mL premix     1,000 mg 200 mL/hr over 60 Minutes Intravenous  Once 10/12/2015 1903 11/01/2015 2147       Radiology Studies: No results found.      Scheduled Meds: . docusate sodium  100 mg Oral BID  . heparin  5,000 Units Subcutaneous Q8H  . insulin aspart  0-9 Units Subcutaneous TID WC  . insulin glargine  25 Units Subcutaneous QHS  . piperacillin-tazobactam (ZOSYN)  IV  3.375 g Intravenous Q8H  . tamsulosin  0.4 mg Oral Daily  . vancomycin  500 mg Intravenous Q12H   Continuous Infusions: . sodium chloride 125 mL/hr at 10/28/15 0803  . lactated ringers 50 mL/hr at 10/26/2015 1019     LOS: 2 days    Time spent: 25  min    Serah Nicoletti Juanetta Gosling, DO [facility] Pager [phone]  If 7PM-7AM, please contact night-coverage [email or web address] Password [facility] 10/28/2015, 8:35 AM

## 2015-10-28 NOTE — Progress Notes (Signed)
[facility] Progress Note  1 Day Post-Op  Subjective: Pt feels miserable today.  Feels feverish/chills/muscle aches.  No N/V, tolerating PO intake, but mostly liquids so far.  Hasn't been OOB.  Can't urinate, getting foley.    Objective: Vital signs in last 24 hours: Temp:  [97.8 F (36.6 C)-101.1 F (38.4 C)] 97.8 F (36.6 C) (04/22 0545) Pulse Rate:  [83-110] 84 (04/22 0545) Resp:  [14-18] 18 (04/22 0545) BP: (108-154)/(60-86) 118/60 mmHg (04/22 0545) SpO2:  [97 %-100 %] 97 % (04/22 0545) Last BM Date: 10/21/15  Intake/Output from previous day: 04/21 0701 - 04/22 0700 In: 3410 [I.V.:3010; IV Piggyback:400] Out: 1405 [Urine:1400; Blood:5] Intake/Output this shift:    PE: Gen:  Alert, NAD, pleasant Card:  RRR, no M/G/R heard Pulm:  CTA, no W/R/R Abd: Soft, mild tenderness, +BS, no HSM, incisions C/D/I, drain with minimal sanguinous drainage, no abdominal scars noted Left Buttock:  Incision draining small amounts of seropurulent drainage, penrose in place, no significant peri-wound erythema and no fluctuance  Lab Results:   Recent Labs  10/16/2015 0547 10/28/15 0552  WBC 24.8* 33.9*  HGB 9.1* 8.3*  HCT 28.6* 26.4*  PLT 490* 436*   BMET  Recent Labs  10/14/2015 0547 10/28/15 0552  NA 134* 133*  K 3.5 3.5  CL 103 103  CO2 19* 17*  GLUCOSE 218* 92  BUN 31* 35*  CREATININE 1.99* 3.01*  CALCIUM 7.7* 7.5*   PT/INR No results for input(s): LABPROT, INR in the last 72 hours. CMP     Component Value Date/Time   NA 133* 10/28/2015 0552   K 3.5 10/28/2015 0552   CL 103 10/28/2015 0552   CO2 17* 10/28/2015 0552   GLUCOSE 92 10/28/2015 0552   BUN 35* 10/28/2015 0552   CREATININE 3.01* 10/28/2015 0552   CREATININE 1.57* 04/12/2015 1052   CALCIUM 7.5* 10/28/2015 0552   PROT 6.2 04/12/2015 1052   ALBUMIN 2.5* 04/12/2015 1052   AST 18 04/12/2015 1052   ALT 6* 04/12/2015 1052   ALKPHOS 301* 04/12/2015 1052   BILITOT 0.2 04/12/2015 1052   GFRNONAA  24* 10/28/2015 0552   GFRAA 28* 10/28/2015 0552   Lipase     Component Value Date/Time   LIPASE 39 08/29/2015 1929       Studies/Results: No results found.  Anti-infectives: Anti-infectives    Start     Dose/Rate Route Frequency Ordered Stop   10/30/2015 0800  vancomycin (VANCOCIN) 500 mg in sodium chloride 0.9 % 100 mL IVPB     500 mg 100 mL/hr over 60 Minutes Intravenous Every 12 hours 10/07/2015 1931     10/11/2015 0200  piperacillin-tazobactam (ZOSYN) IVPB 3.375 g     3.375 g 12.5 mL/hr over 240 Minutes Intravenous Every 8 hours 10/21/2015 1938     10/29/2015 2000  piperacillin-tazobactam (ZOSYN) IVPB 3.375 g     3.375 g 100 mL/hr over 30 Minutes Intravenous  Once 10/14/2015 1903 10/28/2015 2117   10/25/2015 2000  vancomycin (VANCOCIN) IVPB 1000 mg/200 mL premix     1,000 mg 200 mL/hr over 60 Minutes Intravenous  Once 10/25/2015 1903 10/25/2015 2147       Assessment/Plan Left gluteal abscess POD #1 s/p I&D - Dr. Sheliah Hatch  -Pain control -Sitz baths/shower -Carb mod diet -Ambulate and IS -SCD's and heparin -Continue IV antibiotics (Vanc/Zosyn), Cx pending -D/c home when pain better controlled and leukocytosis improving  AKI on CKD3 - foley, flomax Leukocytosis - 33.9 - likely reactive ABL Anemia - monitor  closely Hgb 8.3     LOS: 2 days    Nonie Hoyer 10/28/2015, 8:20 AM Pager: [phone]  (7am - 4:30pm M-F; 7am - 11:30am Sa/Su)

## 2015-10-28 NOTE — Progress Notes (Signed)
Rehab Admissions Coordinator Note:  Patient was screened by Clois Dupes for appropriateness for an Inpatient Acute Rehab Consult per PT recommendation. At this time, we are recommending await further therapy progress for pt has not demonstrated the ability or need for intense hospital rehab.    Clois Dupes 10/28/2015, 5:46 PM  I can be reached at [phone].

## 2015-10-28 NOTE — Progress Notes (Signed)
Pt has refused to shower,ambulate or sit in recliner. Tech and myself have ask many times to get OOB. Pt has slept most of shift.

## 2015-10-29 ENCOUNTER — Inpatient Hospital Stay (HOSPITAL_COMMUNITY): Payer: Managed Care, Other (non HMO)

## 2015-10-29 DIAGNOSIS — N179 Acute kidney failure, unspecified: Secondary | ICD-10-CM

## 2015-10-29 LAB — C DIFFICILE QUICK SCREEN W PCR REFLEX
C DIFFICILE (CDIFF) INTERP: NEGATIVE
C DIFFICILE (CDIFF) TOXIN: NEGATIVE
C DIFFICLE (CDIFF) ANTIGEN: NEGATIVE

## 2015-10-29 LAB — CREATININE, URINE, RANDOM: CREATININE, URINE: 151.02 mg/dL

## 2015-10-29 LAB — BASIC METABOLIC PANEL
Anion gap: 13 (ref 5–15)
Anion gap: 15 (ref 5–15)
BUN: 38 mg/dL — ABNORMAL HIGH (ref 6–20)
BUN: 38 mg/dL — ABNORMAL HIGH (ref 6–20)
CALCIUM: 7.5 mg/dL — AB (ref 8.9–10.3)
CO2: 15 mmol/L — ABNORMAL LOW (ref 22–32)
CO2: 15 mmol/L — AB (ref 22–32)
CREATININE: 3.76 mg/dL — AB (ref 0.61–1.24)
Calcium: 7.2 mg/dL — ABNORMAL LOW (ref 8.9–10.3)
Chloride: 105 mmol/L (ref 101–111)
Chloride: 107 mmol/L (ref 101–111)
Creatinine, Ser: 3.88 mg/dL — ABNORMAL HIGH (ref 0.61–1.24)
GFR calc Af Amer: 21 mL/min — ABNORMAL LOW (ref >60)
GFR calc non Af Amer: 18 mL/min — ABNORMAL LOW (ref >60)
GFR calc non Af Amer: 19 mL/min — ABNORMAL LOW (ref >60)
GFR, EST AFRICAN AMERICAN: 22 mL/min — AB (ref >60)
GLUCOSE: 107 mg/dL — AB (ref 65–99)
Glucose, Bld: 108 mg/dL — ABNORMAL HIGH (ref 65–99)
Potassium: 3.5 mmol/L (ref 3.5–5.1)
Potassium: 3.9 mmol/L (ref 3.5–5.1)
Sodium: 135 mmol/L (ref 135–145)
Sodium: 135 mmol/L (ref 135–145)

## 2015-10-29 LAB — RENAL FUNCTION PANEL
ANION GAP: 15 (ref 5–15)
BUN: 40 mg/dL — ABNORMAL HIGH (ref 6–20)
CO2: 13 mmol/L — AB (ref 22–32)
Calcium: 7.4 mg/dL — ABNORMAL LOW (ref 8.9–10.3)
Chloride: 104 mmol/L (ref 101–111)
Creatinine, Ser: 4.29 mg/dL — ABNORMAL HIGH (ref 0.61–1.24)
GFR calc non Af Amer: 16 mL/min — ABNORMAL LOW (ref >60)
GFR, EST AFRICAN AMERICAN: 18 mL/min — AB (ref >60)
GLUCOSE: 98 mg/dL (ref 65–99)
PHOSPHORUS: 6 mg/dL — AB (ref 2.5–4.6)
POTASSIUM: 3.6 mmol/L (ref 3.5–5.1)
Sodium: 132 mmol/L — ABNORMAL LOW (ref 135–145)

## 2015-10-29 LAB — URINALYSIS, ROUTINE W REFLEX MICROSCOPIC
Bilirubin Urine: NEGATIVE
GLUCOSE, UA: NEGATIVE mg/dL
KETONES UR: NEGATIVE mg/dL
Nitrite: NEGATIVE
PROTEIN: 100 mg/dL — AB
Specific Gravity, Urine: 1.026 (ref 1.005–1.030)
pH: 5 (ref 5.0–8.0)

## 2015-10-29 LAB — HEPATIC FUNCTION PANEL
ALT: 8 U/L — ABNORMAL LOW (ref 17–63)
AST: 30 U/L (ref 15–41)
Albumin: 1 g/dL — ABNORMAL LOW (ref 3.5–5.0)
Alkaline Phosphatase: 521 U/L — ABNORMAL HIGH (ref 38–126)
BILIRUBIN DIRECT: 0.4 mg/dL (ref 0.1–0.5)
BILIRUBIN INDIRECT: 0.6 mg/dL (ref 0.3–0.9)
Total Bilirubin: 1 mg/dL (ref 0.3–1.2)
Total Protein: 5.2 g/dL — ABNORMAL LOW (ref 6.5–8.1)

## 2015-10-29 LAB — GLUCOSE, CAPILLARY
GLUCOSE-CAPILLARY: 114 mg/dL — AB (ref 65–99)
Glucose-Capillary: 116 mg/dL — ABNORMAL HIGH (ref 65–99)
Glucose-Capillary: 90 mg/dL (ref 65–99)
Glucose-Capillary: 112 mg/dL — ABNORMAL HIGH (ref 65–99)

## 2015-10-29 LAB — CBC
HCT: 26.4 % — ABNORMAL LOW (ref 39.0–52.0)
HEMATOCRIT: 26.5 % — AB (ref 39.0–52.0)
Hemoglobin: 8.5 g/dL — ABNORMAL LOW (ref 13.0–17.0)
Hemoglobin: 8.7 g/dL — ABNORMAL LOW (ref 13.0–17.0)
MCH: 26.2 pg (ref 26.0–34.0)
MCH: 26.3 pg (ref 26.0–34.0)
MCHC: 32.2 g/dL (ref 30.0–36.0)
MCHC: 32.8 g/dL (ref 30.0–36.0)
MCV: 79.8 fL (ref 78.0–100.0)
MCV: 81.7 fL (ref 78.0–100.0)
PLATELETS: 450 10*3/uL — AB (ref 150–400)
Platelets: 479 10*3/uL — ABNORMAL HIGH (ref 150–400)
RBC: 3.23 MIL/uL — ABNORMAL LOW (ref 4.22–5.81)
RBC: 3.32 MIL/uL — ABNORMAL LOW (ref 4.22–5.81)
RDW: 14.4 % (ref 11.5–15.5)
RDW: 14.1 % (ref 11.5–15.5)
WBC: 42.7 10*3/uL — ABNORMAL HIGH (ref 4.0–10.5)
WBC: 46 10*3/uL — ABNORMAL HIGH (ref 4.0–10.5)

## 2015-10-29 LAB — OSMOLALITY, URINE: Osmolality, Ur: 291 mOsm/kg — ABNORMAL LOW (ref 300–900)

## 2015-10-29 LAB — URINE MICROSCOPIC-ADD ON: BACTERIA UA: NONE SEEN

## 2015-10-29 LAB — SODIUM, URINE, RANDOM: Sodium, Ur: <10 mmol/L

## 2015-10-29 MED ORDER — DIATRIZOATE MEGLUMINE & SODIUM 66-10 % PO SOLN
15.0000 mL | Freq: Once | ORAL | Status: AC
  Administered 2015-10-29: 15 mL via ORAL
  Filled 2015-10-29: qty 30

## 2015-10-29 MED ORDER — MORPHINE SULFATE (PF) 2 MG/ML IV SOLN
2.0000 mg | Freq: Once | INTRAVENOUS | Status: AC
Start: 2015-10-29 — End: 2015-10-29
  Administered 2015-10-29: 2 mg via INTRAVENOUS
  Filled 2015-10-29: qty 1

## 2015-10-29 MED ORDER — METRONIDAZOLE IN NACL 5-0.79 MG/ML-% IV SOLN
500.0000 mg | Freq: Three times a day (TID) | INTRAVENOUS | Status: DC
  Administered 2015-10-29 – 2015-10-30 (×3): 500 mg via INTRAVENOUS
  Filled 2015-10-29 (×4): qty 100

## 2015-10-29 MED ORDER — DIATRIZOATE MEGLUMINE & SODIUM 66-10 % PO SOLN
ORAL | Status: AC
  Filled 2015-10-29: qty 30

## 2015-10-29 MED ORDER — SODIUM CHLORIDE 0.9 % IV BOLUS (SEPSIS)
1000.0000 mL | Freq: Once | INTRAVENOUS | Status: AC
  Administered 2015-10-29: 1000 mL via INTRAVENOUS

## 2015-10-29 NOTE — Progress Notes (Signed)
Patient continued to have extremely low output throughout night. Patient was bladder scanned with a total of 75 ml being found. Foley was also irrigated with 60 ml NS with a total of 60 clear/light Yellow fluid being returned. . Labs came back as follows: WBC 42.7, BUN 38, Creatinine 3.76, GFR 22. V/S 99.0, 98/59, 86, 95/RA, 19 rr. K. Schorr was notified and placed and order for 1000 cc bolus. Along with a new set of labs. Rapid was also consulted about patients condition, April from rapid response stated that she will monitor his condition and we should call if needed. Will  continue to monitor patients condition.  Cindee Salt, RN

## 2015-10-29 NOTE — Progress Notes (Signed)
PROGRESS NOTE    Shane Dougherty  RUE:[id number] DOB: 03/28/1976 DOA: Nov 03, 2015 PCP: Jaclyn Shaggy, MD   Outpatient Specialists:      Brief Narrative:  Shane Dougherty is a 40 y.o. male with medical history significant of diabetes. He complains for diarrhea x 1 month. Nothing made better or worse. No history of crohn's/UC- no family hx either. He then a few days before 4/16 developed left buttock pain. He was seen in the ER where he was diagnosed with intergluteal infection with small abscess. An I/D was done that some purulent drainage. He was given norco, clindamycin and told to do warm soaks.  He presented back at [facility] [place] with worsening pain and pressure. tx to [facility] for further I&D in operating room.  Had urinary retention and foley placed.  Creatine and Urine output did not respond to fluid boluses.   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 Need for lasix? -U/A ordered    DVT prophylaxis:  Lovenox  Code Status: Full Code   Family Communication:   Disposition Plan:     Consultants:   CCS  nephrology  Procedures:   I&D     Subjective: C/o pain all over Loose BM today (1st BM since admission)  Objective: Filed Vitals:   10/28/15 1530 10/28/15 2133 10/29/15 0111 10/29/15 0559  BP: 96/60 109/72 98/59 99/59   Pulse:  98 86 88  Temp:  99.3 F (37.4 C) 99 F (37.2 C) 99.1 F (37.3 C)  TempSrc:  Oral    Resp:  17 16 16   Height:      Weight:      SpO2:  97% 95% 98%    Intake/Output Summary (Last 24 hours) at 10/29/15 0835 Last data filed at 10/29/15 0600  Gross per 24 hour  Intake 6554.58 ml  Output    440 ml  Net 6114.58 ml   Filed Weights   November 03, 2015 1154 11/03/15 1800  Weight: 65.318 kg (144 lb) 67.132 kg (148 lb)    Examination:  General exam: Appears calm and comfortable  Respiratory system: Clear to auscultation anterior- unable to sit up due to pain. Respiratory effort normal. Cardiovascular system: S1 & S2 heard, RRR. No JVD, murmurs, rubs, gallops or clicks. +edema in hands/feet Gastrointestinal system: Abdomen is nondistended, soft and nontender. No organomegaly or masses felt. Normal bowel sounds heard. Psychiatry: Judgement and insight appear normal.  Mood & affect appropriate.     Data Reviewed: I have personally reviewed following labs and imaging studies  CBC:  Recent Labs Lab 10/13/2015 1318 10/22/2015 1933 10/28/2015 0547 10/28/15 0552 10/29/15 10/29/15 0311  WBC 22.8* 24.5* 24.8* 33.9* 42.7* 46.0*  NEUTROABS 20.7*  --   --   --   --   --   HGB 10.6* 9.8* 9.1* 8.3* 8.7* 8.5*  HCT 31.6* 30.2* 28.6* 26.4* 26.5* 26.4*  MCV 78.6 79.1 79.4 80.2 79.8 81.7  PLT 453* 541* 490* 436* 450* 479*   Basic Metabolic Panel:  Recent Labs Lab 10/25/2015 1318 10/15/2015 1933 10/30/2015 0547 10/28/15 0552 10/29/15  10/29/15 0311  NA 130*  --  134* 133* 135 135  K 3.8  --  3.5 3.5 3.9 3.5  CL 99*  --  103 103 107 105  CO2 21*  --  19* 17* 15* 15*  GLUCOSE 291*  --  218* 92 107* 108*  BUN 34*  --  31* 35* 38* 38*  CREATININE 1.93* 1.96* 1.99* 3.01* 3.76* 3.88*  CALCIUM 8.1*  --  7.7* 7.5* 7.5* 7.2*   GFR: Estimated Creatinine Clearance: 23.7 mL/min (by C-G formula based on Cr of 3.88). Liver Function Tests: No results for input(s): AST, ALT, ALKPHOS, BILITOT, PROT, ALBUMIN in the last 168 hours. No results for input(s): LIPASE, AMYLASE in the last 168 hours. No results for input(s): AMMONIA in the last 168 hours. Coagulation Profile: No results for input(s): INR, PROTIME in the last 168 hours. Cardiac Enzymes: No results for input(s): CKTOTAL, CKMB, CKMBINDEX, TROPONINI in the last 168 hours. BNP (last 3 results) No results for input(s): PROBNP in the last 8760 hours. HbA1C:  Recent Labs  10/27/2015 1933  HGBA1C 14.6*   CBG:  Recent Labs Lab 10/28/15 0718 10/28/15 1124 10/28/15 1751 10/28/15 2202 10/29/15 0752  GLUCAP 88 116* 128* 120* 114*   Lipid Profile: No results for input(s): CHOL, HDL, LDLCALC, TRIG, CHOLHDL, LDLDIRECT in the last 72 hours. Thyroid Function Tests: No results for input(s): TSH, T4TOTAL, FREET4, T3FREE, THYROIDAB in the last 72 hours. Anemia Panel: No results for input(s): VITAMINB12, FOLATE, FERRITIN, TIBC, IRON, RETICCTPCT in the last 72 hours. Urine analysis:    Component Value Date/Time   COLORURINE YELLOW 08/20/2014 1042   APPEARANCEUR CLEAR 08/20/2014 1042   LABSPEC 1.035* 08/20/2014 1042   PHURINE 6.5 08/20/2014 1042   GLUCOSEU >1000* 08/20/2014 1042   HGBUR SMALL* 08/20/2014 1042   BILIRUBINUR NEGATIVE 08/20/2014 1042   KETONESUR NEGATIVE 08/20/2014 1042   PROTEINUR 100* 08/20/2014 1042   UROBILINOGEN 1.0 08/20/2014 1042   NITRITE NEGATIVE 08/20/2014 1042   LEUKOCYTESUR NEGATIVE 08/20/2014 1042   Recent Results (from the past 240 hour(s))   Culture, blood (Routine X 2) w Reflex to ID Panel     Status: None (Preliminary result)   Collection Time: 10/16/2015  7:52 PM  Result Value Ref Range Status   Specimen Description BLOOD LEFT ANTECUBITAL  Final   Special Requests BOTTLES DRAWN AEROBIC ONLY 10CC  Final   Culture NO GROWTH 2 DAYS  Final   Report Status PENDING  Incomplete  Culture, blood (Routine X 2) w Reflex to ID Panel     Status: None (Preliminary result)   Collection Time: 11/05/2015  8:00 PM  Result Value Ref Range Status   Specimen Description BLOOD LEFT FOREARM  Final   Special Requests BOTTLES DRAWN AEROBIC ONLY 9CC  Final   Culture NO GROWTH 2 DAYS  Final  Report Status PENDING  Incomplete  Surgical pcr screen     Status: None   Collection Time: 10/29/2015  8:27 AM  Result Value Ref Range Status   MRSA, PCR NEGATIVE NEGATIVE Final   Staphylococcus aureus NEGATIVE NEGATIVE Final    Comment:        The Xpert SA Assay (FDA approved for NASAL specimens in patients over 70 years of age), is one component of a comprehensive surveillance program.  Test performance has been validated by [facility] for patients greater than or equal to 65 year old. It is not intended to diagnose infection nor to guide or monitor treatment.   Culture, routine-abscess     Status: None (Preliminary result)   Collection Time: 11/01/2015 11:27 AM  Result Value Ref Range Status   Specimen Description ABSCESS BUTTOCKS LEFT  Final   Special Requests NONE  Final   Gram Stain   Final    MODERATE WBC PRESENT,BOTH PMN AND MONONUCLEAR NO SQUAMOUS EPITHELIAL CELLS SEEN FEW GRAM POSITIVE COCCI IN PAIRS Performed at [facility]    Culture   Final    Culture reincubated for better growth Performed at [facility]    Report Status PENDING  Incomplete      Anti-infectives    Start     Dose/Rate Route Frequency Ordered Stop   10/29/15 0800  vancomycin (VANCOCIN) IVPB 750 mg/150 ml premix     750 mg 150 mL/hr over 60  Minutes Intravenous Every 24 hours 10/28/15 1058     10/14/2015 0800  vancomycin (VANCOCIN) 500 mg in sodium chloride 0.9 % 100 mL IVPB  Status:  Discontinued     500 mg 100 mL/hr over 60 Minutes Intravenous Every 12 hours 10/24/2015 1931 10/28/15 1058   10/16/2015 0200  piperacillin-tazobactam (ZOSYN) IVPB 3.375 g     3.375 g 12.5 mL/hr over 240 Minutes Intravenous Every 8 hours 11/03/2015 1938     10/11/2015 2000  piperacillin-tazobactam (ZOSYN) IVPB 3.375 g     3.375 g 100 mL/hr over 30 Minutes Intravenous  Once 10/28/2015 1903 10/11/2015 2117   10/14/2015 2000  vancomycin (VANCOCIN) IVPB 1000 mg/200 mL premix     1,000 mg 200 mL/hr over 60 Minutes Intravenous  Once 10/23/2015 1903 10/31/2015 2147       Radiology Studies: US Renal  10/28/2015  CLINICAL DATA:  Acute renal failure EXAM: RENAL / URINARY TRACT ULTRASOUND COMPLETE COMPARISON:  None. FINDINGS: Right Kidney: Length: 13.0 cm. Echogenicity within normal limits. No mass or hydronephrosis visualized. Left Kidney: Length: 13.0 cm. Echogenicity within normal limits. No mass or hydronephrosis visualized. Bladder: Foley catheter in position. Bilateral pleural effusions. IMPRESSION: 1. Normal kidneys. 2. Bilateral pleural effusions Electronically Signed   By: Genevive Bi M.D.   On: 10/28/2015 17:37        Scheduled Meds: . docusate sodium  100 mg Oral BID  . heparin  5,000 Units Subcutaneous Q8H  . insulin aspart  0-9 Units Subcutaneous TID WC  . insulin glargine  25 Units Subcutaneous QHS  . piperacillin-tazobactam (ZOSYN)  IV  3.375 g Intravenous Q8H  . tamsulosin  0.4 mg Oral Daily  . vancomycin  750 mg Intravenous Q24H   Continuous Infusions:     LOS: 3 days    Time spent: 25 min    Shyenne Maggard Juanetta Gosling, DO [facility] Pager [phone]  If 7PM-7AM, please contact night-coverage [email or web address] Password [facility] 10/29/2015, 8:35 AM

## 2015-10-29 NOTE — Progress Notes (Signed)
[facility] Progress Note  2 Days Post-Op  Subjective: Pt still feels miserable today.  He c/o abdominal pain/distension, tingling in his hands, right shoulder pain, b/l knee pain, buttock pain.  No N/V.  Not hungry.  Feels weak, chills, fatigue.  He feels cognitively slow.  Not getting out of bed.  He denies h/o or family h/o crohn's, UC, cancers.  He says his brother has cirrhosis, fluid around his heart, and has had a recent prolonged hospitalization.  He can't remember any other family history.  He said he had a "yeast infection" of his groin about a month ago.  Pt has a h/o chronic diarrhea recently.  He has not had a CT abd/pelvis recently.  Objective: Vital signs in last 24 hours: Temp:  [97.8 F (36.6 C)-99.3 F (37.4 C)] 99.1 F (37.3 C) (04/23 0559) Pulse Rate:  [84-98] 88 (04/23 0559) Resp:  [16-18] 16 (04/23 0559) BP: (83-109)/(55-72) 99/59 mmHg (04/23 0559) SpO2:  [95 %-100 %] 98 % (04/23 0559) Last BM Date: 10/21/15  Intake/Output from previous day: 04/22 0701 - 04/23 0700 In: 6941.6 [P.O.:1017; I.V.:2614.6; IV Piggyback:3250] Out: 440 [Urine:440] Intake/Output this shift: Total I/O In: 200 [P.O.:200] Out: -   PE: Gen: Alert, NAD, pleasant Card: RRR, no M/G/R heard Pulm: CTA, no W/R/R Abd: Soft, mild tenderness, +BS, no HSM, incisions C/D/I, drain with minimal sanguinous drainage, no abdominal scars noted Left Buttock: Incision draining small amounts of seropurulent drainage, penrose in place, new area of fluctuance in his left perirectal area, no significant pain, no current drainage, no significant erythema.  +BM's. Extremities:  Right hand swelling, right shoulder pain, b/l knee pain, calves are soft/NT, no erythema/edema to LE   Lab Results:   Recent Labs  10/29/15 10/29/15 0311  WBC 42.7* 46.0*  HGB 8.7* 8.5*  HCT 26.5* 26.4*  PLT 450* 479*   BMET  Recent Labs  10/29/15 10/29/15 0311  NA 135 135  K 3.9 3.5  CL 107 105  CO2 15*  15*  GLUCOSE 107* 108*  BUN 38* 38*  CREATININE 3.76* 3.88*  CALCIUM 7.5* 7.2*   PT/INR No results for input(s): LABPROT, INR in the last 72 hours. CMP     Component Value Date/Time   NA 135 10/29/2015 0311   K 3.5 10/29/2015 0311   CL 105 10/29/2015 0311   CO2 15* 10/29/2015 0311   GLUCOSE 108* 10/29/2015 0311   BUN 38* 10/29/2015 0311   CREATININE 3.88* 10/29/2015 0311   CREATININE 1.57* 04/12/2015 1052   CALCIUM 7.2* 10/29/2015 0311   PROT 5.2* 10/29/2015 0311   ALBUMIN 1.0* 10/29/2015 0311   AST 30 10/29/2015 0311   ALT 8* 10/29/2015 0311   ALKPHOS 521* 10/29/2015 0311   BILITOT 1.0 10/29/2015 0311   GFRNONAA 18* 10/29/2015 0311   GFRAA 21* 10/29/2015 0311   Lipase     Component Value Date/Time   LIPASE 39 08/29/2015 1929       Studies/Results: US Renal  10/28/2015  CLINICAL DATA:  Acute renal failure EXAM: RENAL / URINARY TRACT ULTRASOUND COMPLETE COMPARISON:  None. FINDINGS: Right Kidney: Length: 13.0 cm. Echogenicity within normal limits. No mass or hydronephrosis visualized. Left Kidney: Length: 13.0 cm. Echogenicity within normal limits. No mass or hydronephrosis visualized. Bladder: Foley catheter in position. Bilateral pleural effusions. IMPRESSION: 1. Normal kidneys. 2. Bilateral pleural effusions Electronically Signed   By: Genevive Bi M.D.   On: 10/28/2015 17:37    Anti-infectives: Anti-infectives    Start  Dose/Rate Route Frequency Ordered Stop   10/29/15 0800  vancomycin (VANCOCIN) IVPB 750 mg/150 ml premix     750 mg 150 mL/hr over 60 Minutes Intravenous Every 24 hours 10/28/15 1058     Nov 16, 2015 0800  vancomycin (VANCOCIN) 500 mg in sodium chloride 0.9 % 100 mL IVPB  Status:  Discontinued     500 mg 100 mL/hr over 60 Minutes Intravenous Every 12 hours 10/18/2015 1931 10/28/15 1058   Nov 16, 2015 0200  piperacillin-tazobactam (ZOSYN) IVPB 3.375 g     3.375 g 12.5 mL/hr over 240 Minutes Intravenous Every 8 hours 11/03/2015 1938     10/31/2015 2000   piperacillin-tazobactam (ZOSYN) IVPB 3.375 g     3.375 g 100 mL/hr over 30 Minutes Intravenous  Once 10/30/2015 1903 10/21/2015 2117   10/12/2015 2000  vancomycin (VANCOCIN) IVPB 1000 mg/200 mL premix     1,000 mg 200 mL/hr over 60 Minutes Intravenous  Once 11/05/2015 1903 10/11/2015 2147       Assessment/Plan Left gluteal abscess POD #2 s/p I&D - Dr. Sheliah Hatch  -Pain control -Sitz baths/shower -Carb mod diet -Ambulate and IS -SCD's and heparin -Continue IV antibiotics (Vanc/Zosyn Day #3), Cx pending -CT scan today  Diabetes mellitus AKI on CKD3 - foley, flomax Leukocytosis - 46.0 - Very concerned about this, will order CT abd/pelvis to look for intra-abdominal etiology, un-drained gluteal fluid collections, and malignancy ABL Anemia - monitor closely Hgb 8.3    LOS: 3 days    Nonie Hoyer 10/29/2015, 9:46 AM Pager: [phone]  (7am - 4:30pm M-F; 7am - 11:30am Sa/Su)

## 2015-10-29 NOTE — Progress Notes (Signed)
Patient requesting no more fluids "until I see a kidney doctor". Per patient "thats what killed my mom, putting fluid in and nothing coming out". Patient is requesting a renal consult. IV fluids turned to [facility] until further orders given. Patient has had a total of 75 ml of output in the past 12 hours with a foley. Will continue to monitor. Cindee Salt, RN

## 2015-10-29 NOTE — Consult Note (Signed)
[facility] Renal Consultation Note  Requesting MD: [facility] Indication for Consultation: AKI with oliguria  HPI:  Shane Dougherty is a 40 y.o. male with medical history significant for poorly controlled diabetes.He presented w/ left buttock pain.An I/D was done which yielded some purulent drainage. He presented back at [facility] [place] with worsening pain and pressure. Dr. Winfred Leeds spoke with Dr. Kieth Brightly who requested hospitalist transfer to [facility] for possible further intervention.Patient underwent another I&D here at [facility] and has since experienced significantly reduced UOP and an AKI.  CREAT  Date/Time Value Ref Range Status  04/12/2015 10:52 AM 1.57* 0.60 - 1.35 mg/dL Final   CREATININE, SER  Date/Time Value Ref Range Status  10/29/2015 03:11 AM 3.88* 0.61 - 1.24 mg/dL Final  10/29/2015 12:00 AM 3.76* 0.61 - 1.24 mg/dL Final  10/28/2015 05:52 AM 3.01* 0.61 - 1.24 mg/dL Final    Comment:    DELTA CHECK NOTED  10/29/2015 05:47 AM 1.99* 0.61 - 1.24 mg/dL Final  11/04/2015 07:33 PM 1.96* 0.61 - 1.24 mg/dL Final  10/25/2015 01:18 PM 1.93* 0.61 - 1.24 mg/dL Final  08/29/2015 07:29 PM 1.27* 0.61 - 1.24 mg/dL Final  04/08/2015 06:15 AM 1.57* 0.61 - 1.24 mg/dL Final  04/07/2015 04:27 AM 1.59* 0.61 - 1.24 mg/dL Final  04/05/2015 06:34 AM 0.98 0.61 - 1.24 mg/dL Final  04/04/2015 04:58 AM 0.95 0.61 - 1.24 mg/dL Final  04/03/2015 10:55 AM 1.00 0.61 - 1.24 mg/dL Final  11/25/2014 08:02 PM 0.60* 0.61 - 1.24 mg/dL Final  11/25/2014 03:41 PM 0.88 0.61 - 1.24 mg/dL Final  08/20/2014 07:57 AM 0.92 0.50 - 1.35 mg/dL Final  03/28/2009 05:11 PM 0.74 0.4 - 1.5 mg/dL Final     PMHx:   Past Medical History  Diagnosis Date  . Diabetes mellitus without complication ([place])   . Bell's palsy 08/2014  . CKD (chronic kidney disease)     Past Surgical History  Procedure Laterality Date  . Incise and drain abcess  10/21/2015    GLUTEAL    Family Hx:  Family History   Problem Relation Age of Onset  . CAD Mother     Social History:  reports that he has been passively smoking.  He has never used smokeless tobacco. He reports that he drinks alcohol. He reports that he does not use illicit drugs.  Allergies:  Allergies  Allergen Reactions  . Bee Venom Swelling    Throat involvement    Medications: Prior to Admission medications   Medication Sig Start Date End Date Taking? Authorizing Provider  acetaminophen (TYLENOL) 500 MG tablet Take 500-1,000 mg by mouth every 6 (six) hours as needed for mild pain or moderate pain.   Yes Historical Provider, MD  clindamycin (CLEOCIN) 300 MG capsule Take 1 capsule (300 mg total) by mouth 4 (four) times daily. X 7 days 10/22/15  Yes Daleen Bo, MD  HYDROcodone-acetaminophen (NORCO) 5-325 MG tablet Take 1-2 tablets by mouth every 4 (four) hours as needed. Patient taking differently: Take 1-2 tablets by mouth every 4 (four) hours as needed for moderate pain.  10/22/15  Yes Daleen Bo, MD  insulin glargine (LANTUS) 100 UNIT/ML injection Inject 0.25 mLs (25 Units total) into the skin at bedtime. 08/11/15  Yes Arnoldo Morale, MD  insulin lispro (HUMALOG) 100 UNIT/ML injection Inject 0.1 mLs (10 Units total) into the skin 3 (three) times daily with meals. Your sliding scale with meals. 08/22/15  Yes Arnoldo Morale, MD  polyvinyl alcohol (LIQUIFILM TEARS) 1.4 % ophthalmic  solution Place 3-4 drops into both eyes as needed for dry eyes.   Yes Historical Provider, MD    I have reviewed the patient's current medications.  Labs:  Results for orders placed or performed during the hospital encounter of 10/07/2015 (from the past 48 hour(s))  Glucose, capillary     Status: None   Collection Time: 11/05/2015  5:03 PM  Result Value Ref Range   Glucose-Capillary 95 65 - 99 mg/dL  Glucose, capillary     Status: Abnormal   Collection Time: 10/17/2015 10:16 PM  Result Value Ref Range   Glucose-Capillary 305 (H) 65 - 99 mg/dL   Comment 1  Notify RN   CBC     Status: Abnormal   Collection Time: 10/28/15  5:52 AM  Result Value Ref Range   WBC 33.9 (H) 4.0 - 10.5 K/uL   RBC 3.29 (L) 4.22 - 5.81 MIL/uL   Hemoglobin 8.3 (L) 13.0 - 17.0 g/dL   HCT 26.4 (L) 39.0 - 52.0 %   MCV 80.2 78.0 - 100.0 fL   MCH 25.2 (L) 26.0 - 34.0 pg   MCHC 31.4 30.0 - 36.0 g/dL   RDW 13.8 11.5 - 15.5 %   Platelets 436 (H) 150 - 400 K/uL  Basic metabolic panel     Status: Abnormal   Collection Time: 10/28/15  5:52 AM  Result Value Ref Range   Sodium 133 (L) 135 - 145 mmol/L   Potassium 3.5 3.5 - 5.1 mmol/L   Chloride 103 101 - 111 mmol/L   CO2 17 (L) 22 - 32 mmol/L   Glucose, Bld 92 65 - 99 mg/dL   BUN 35 (H) 6 - 20 mg/dL   Creatinine, Ser 3.01 (H) 0.61 - 1.24 mg/dL    Comment: DELTA CHECK NOTED   Calcium 7.5 (L) 8.9 - 10.3 mg/dL   GFR calc non Af Amer 24 (L) >60 mL/min   GFR calc Af Amer 28 (L) >60 mL/min    Comment: (NOTE) The eGFR has been calculated using the CKD EPI equation. This calculation has not been validated in all clinical situations. eGFR's persistently <60 mL/min signify possible Chronic Kidney Disease.    Anion gap 13 5 - 15  Glucose, capillary     Status: None   Collection Time: 10/28/15  7:18 AM  Result Value Ref Range   Glucose-Capillary 88 65 - 99 mg/dL  Glucose, capillary     Status: Abnormal   Collection Time: 10/28/15 11:24 AM  Result Value Ref Range   Glucose-Capillary 116 (H) 65 - 99 mg/dL  Glucose, capillary     Status: Abnormal   Collection Time: 10/28/15  5:51 PM  Result Value Ref Range   Glucose-Capillary 128 (H) 65 - 99 mg/dL  Lactic acid, plasma     Status: None   Collection Time: 10/28/15  7:12 PM  Result Value Ref Range   Lactic Acid, Venous 0.8 0.5 - 2.0 mmol/L  Glucose, capillary     Status: Abnormal   Collection Time: 10/28/15 10:02 PM  Result Value Ref Range   Glucose-Capillary 120 (H) 65 - 99 mg/dL   Comment 1 Notify RN    Comment 2 Document in Chart   CBC     Status: Abnormal    Collection Time: 10/29/15 12:00 AM  Result Value Ref Range   WBC 42.7 (H) 4.0 - 10.5 K/uL   RBC 3.32 (L) 4.22 - 5.81 MIL/uL   Hemoglobin 8.7 (L) 13.0 - 17.0 g/dL   HCT  26.5 (L) 39.0 - 52.0 %   MCV 79.8 78.0 - 100.0 fL   MCH 26.2 26.0 - 34.0 pg   MCHC 32.8 30.0 - 36.0 g/dL   RDW 14.1 11.5 - 15.5 %   Platelets 450 (H) 150 - 400 K/uL  Basic metabolic panel     Status: Abnormal   Collection Time: 10/29/15 12:00 AM  Result Value Ref Range   Sodium 135 135 - 145 mmol/L   Potassium 3.9 3.5 - 5.1 mmol/L   Chloride 107 101 - 111 mmol/L   CO2 15 (L) 22 - 32 mmol/L   Glucose, Bld 107 (H) 65 - 99 mg/dL   BUN 38 (H) 6 - 20 mg/dL   Creatinine, Ser 3.76 (H) 0.61 - 1.24 mg/dL   Calcium 7.5 (L) 8.9 - 10.3 mg/dL   GFR calc non Af Amer 19 (L) >60 mL/min   GFR calc Af Amer 22 (L) >60 mL/min    Comment: (NOTE) The eGFR has been calculated using the CKD EPI equation. This calculation has not been validated in all clinical situations. eGFR's persistently <60 mL/min signify possible Chronic Kidney Disease.    Anion gap 13 5 - 15  Basic metabolic panel     Status: Abnormal   Collection Time: 10/29/15  3:11 AM  Result Value Ref Range   Sodium 135 135 - 145 mmol/L   Potassium 3.5 3.5 - 5.1 mmol/L   Chloride 105 101 - 111 mmol/L   CO2 15 (L) 22 - 32 mmol/L   Glucose, Bld 108 (H) 65 - 99 mg/dL   BUN 38 (H) 6 - 20 mg/dL   Creatinine, Ser 3.88 (H) 0.61 - 1.24 mg/dL   Calcium 7.2 (L) 8.9 - 10.3 mg/dL   GFR calc non Af Amer 18 (L) >60 mL/min   GFR calc Af Amer 21 (L) >60 mL/min    Comment: (NOTE) The eGFR has been calculated using the CKD EPI equation. This calculation has not been validated in all clinical situations. eGFR's persistently <60 mL/min signify possible Chronic Kidney Disease.    Anion gap 15 5 - 15  CBC     Status: Abnormal   Collection Time: 10/29/15  3:11 AM  Result Value Ref Range   WBC 46.0 (H) 4.0 - 10.5 K/uL    Comment: REPEATED TO VERIFY CONSISTENT WITH PREVIOUS RESULT     RBC 3.23 (L) 4.22 - 5.81 MIL/uL   Hemoglobin 8.5 (L) 13.0 - 17.0 g/dL   HCT 26.4 (L) 39.0 - 52.0 %   MCV 81.7 78.0 - 100.0 fL   MCH 26.3 26.0 - 34.0 pg   MCHC 32.2 30.0 - 36.0 g/dL   RDW 14.4 11.5 - 15.5 %   Platelets 479 (H) 150 - 400 K/uL  Hepatic function panel     Status: Abnormal   Collection Time: 10/29/15  3:11 AM  Result Value Ref Range   Total Protein 5.2 (L) 6.5 - 8.1 g/dL   Albumin 1.0 (L) 3.5 - 5.0 g/dL   AST 30 15 - 41 U/L   ALT 8 (L) 17 - 63 U/L   Alkaline Phosphatase 521 (H) 38 - 126 U/L   Total Bilirubin 1.0 0.3 - 1.2 mg/dL   Bilirubin, Direct 0.4 0.1 - 0.5 mg/dL   Indirect Bilirubin 0.6 0.3 - 0.9 mg/dL  Glucose, capillary     Status: Abnormal   Collection Time: 10/29/15  7:52 AM  Result Value Ref Range   Glucose-Capillary 114 (H) 65 - 99 mg/dL  Urinalysis, Routine w reflex microscopic (not at [facility])     Status: Abnormal   Collection Time: 10/29/15 10:20 AM  Result Value Ref Range   Color, Urine YELLOW YELLOW   APPearance TURBID (A) CLEAR   Specific Gravity, Urine 1.026 1.005 - 1.030   pH 5.0 5.0 - 8.0   Glucose, UA NEGATIVE NEGATIVE mg/dL   Hgb urine dipstick MODERATE (A) NEGATIVE   Bilirubin Urine NEGATIVE NEGATIVE   Ketones, ur NEGATIVE NEGATIVE mg/dL   Protein, ur 100 (A) NEGATIVE mg/dL   Nitrite NEGATIVE NEGATIVE   Leukocytes, UA MODERATE (A) NEGATIVE  Urine microscopic-add on     Status: Abnormal   Collection Time: 10/29/15 10:20 AM  Result Value Ref Range   Squamous Epithelial / LPF 0-5 (A) NONE SEEN   WBC, UA 6-30 0 - 5 WBC/hpf   RBC / HPF 0-5 0 - 5 RBC/hpf   Bacteria, UA NONE SEEN NONE SEEN    Comment: NONE SEEN   Casts GRANULAR CAST (A) NEGATIVE   Urine-Other AMORPHOUS URATES/PHOSPHATES     Comment: LESS THAN 10 mL OF URINE SUBMITTED  Glucose, capillary     Status: Abnormal   Collection Time: 10/29/15 12:03 PM  Result Value Ref Range   Glucose-Capillary 116 (H) 65 - 99 mg/dL     ROS:  A comprehensive review of systems was negative  except for: Constitutional: positive for malaise Gastrointestinal: positive for diarrhea Genitourinary: positive for oliguria Musculoskeletal: positive for sacral pain  Physical Exam: Filed Vitals:   10/29/15 0559 10/29/15 1255  BP: 99/59 122/70  Pulse: 88 100  Temp: 99.1 F (37.3 C) 98.3 F (36.8 C)  Resp: 16 16     General -- oriented x3, pleasant and cooperative. HEENT -- Head is normocephalic. PERRLA. EOMI.  Chest -- good expansion. Lungs clear to auscultation. Cardiac -- RRR. No murmurs noted.  Abdomen -- soft, distended ("feels tight") but nontender. No masses palpable. Bowel sounds present. CNS -- cranial nerves II through XII grossly intact.   Extremeties - +2 pitting edema in LE bilaterally as well as in his RUE. ROM good. Dorsalis pedis pulses present and symmetrical.     Assessment/Plan: 1. Acute on Chronic Kidney Disease; possible ischemic ATN 2/2 hypoperfusion vs sepsis: (CKD likely 2/2 poorly controlled DM) Patient is oliguric w/ 438m UOP in 24hrs. Minimally/non-responsive to fluid bolus (currently up 9.5L since admission). Cr 3.8 w/ Baseline around 1.6. Looks like ATN w/ granular casts, steep Cr rise, and low BUN:Cr. Will obtain FENa and urine osmolality. Orders for strict Is/Os placed. Considering diuretics at this time but no sufficient data to indicate mortality benefit. Will discuss w/ attending. Daily Renal Panel. 40yo w DM2, buttock abscess/ decub sp I&D, has acute on CRF, baseline creat 1.5.  UA pyuria, no rbc's and gran casts. Given lots of volume but 3rd spacing and BP's remain low.  May have ATN but UNa low suggesting CHF/ cirrhosis or hypoperfusion from severe hypoalbumin.  Not sure why alb < 1.0, no stigmata of liver disease and CT mentions normal appearing liver.  Plan hold IVF and no lasix for now, get ECHO for LV/ RV fxn, CXR.  Will follow.  2. BP/Volume: UOP 4455m up 9.5L since admission. BPs good now, but had been down to 83 systolic. Notable 3rd spacing  in extremities and likely abdomen. Monitor 3. Anemia: Hgb 8.5 but stable. Will monitor. WBC incredibly high at 46. Patient on Vanc/Zosyn -- may consider ID consult. No recent steroid use noted.  Consider source outside the abscess? 4. Abscess: per surgery 5. Diarrhea: patient has soiled himself x3 today. C Diff PCR cooking. Starting to sound suspicious w/ raising WBC, recent OP abx use, and diarrhea.  6. Diabetes: poorly controlled. Likely cause of CKD. Management per primary team.   Georges Lynch 10/29/2015, 4:47 PM   Pt seen, examined, agree w assess/plan as above with additions as indicated.  Kelly Splinter MD [employer] pager [phone]    cell [phone] 10/29/2015, 5:52 PM

## 2015-10-30 ENCOUNTER — Inpatient Hospital Stay (HOSPITAL_COMMUNITY): Payer: Managed Care, Other (non HMO)

## 2015-10-30 ENCOUNTER — Encounter (HOSPITAL_COMMUNITY): Payer: Self-pay | Admitting: General Surgery

## 2015-10-30 DIAGNOSIS — K611 Rectal abscess: Secondary | ICD-10-CM

## 2015-10-30 DIAGNOSIS — R935 Abnormal findings on diagnostic imaging of other abdominal regions, including retroperitoneum: Secondary | ICD-10-CM

## 2015-10-30 DIAGNOSIS — R197 Diarrhea, unspecified: Secondary | ICD-10-CM

## 2015-10-30 DIAGNOSIS — J918 Pleural effusion in other conditions classified elsewhere: Secondary | ICD-10-CM

## 2015-10-30 LAB — URINE CULTURE: CULTURE: NO GROWTH

## 2015-10-30 LAB — GLUCOSE, CAPILLARY
GLUCOSE-CAPILLARY: 111 mg/dL — AB (ref 65–99)
Glucose-Capillary: 70 mg/dL (ref 65–99)
Glucose-Capillary: 75 mg/dL (ref 65–99)
Glucose-Capillary: 124 mg/dL — ABNORMAL HIGH (ref 65–99)
Glucose-Capillary: 99 mg/dL (ref 65–99)

## 2015-10-30 LAB — CULTURE, ROUTINE-ABSCESS

## 2015-10-30 LAB — RENAL FUNCTION PANEL
ALBUMIN: 1 g/dL — AB (ref 3.5–5.0)
ANION GAP: 17 — AB (ref 5–15)
BUN: 44 mg/dL — AB (ref 6–20)
CALCIUM: 7.4 mg/dL — AB (ref 8.9–10.3)
CO2: 13 mmol/L — AB (ref 22–32)
CREATININE: 4.57 mg/dL — AB (ref 0.61–1.24)
Chloride: 101 mmol/L (ref 101–111)
GFR calc Af Amer: 17 mL/min — ABNORMAL LOW (ref >60)
GFR calc non Af Amer: 15 mL/min — ABNORMAL LOW (ref >60)
GLUCOSE: 26 mg/dL — AB (ref 65–99)
PHOSPHORUS: 6.6 mg/dL — AB (ref 2.5–4.6)
Potassium: 3.5 mmol/L (ref 3.5–5.1)
SODIUM: 131 mmol/L — AB (ref 135–145)

## 2015-10-30 LAB — CBC
HEMATOCRIT: 25.9 % — AB (ref 39.0–52.0)
HEMOGLOBIN: 8.3 g/dL — AB (ref 13.0–17.0)
MCH: 25.3 pg — ABNORMAL LOW (ref 26.0–34.0)
MCHC: 32 g/dL (ref 30.0–36.0)
MCV: 79 fL (ref 78.0–100.0)
Platelets: 438 10*3/uL — ABNORMAL HIGH (ref 150–400)
RBC: 3.28 MIL/uL — AB (ref 4.22–5.81)
RDW: 14.1 % (ref 11.5–15.5)
WBC: 49.9 10*3/uL — AB (ref 4.0–10.5)

## 2015-10-30 LAB — ECHOCARDIOGRAM COMPLETE
Height: 67 in
Weight: 2368 oz

## 2015-10-30 LAB — VITAMIN B12: Vitamin B-12: 3293 pg/mL — ABNORMAL HIGH (ref 180–914)

## 2015-10-30 LAB — MRSA PCR SCREENING: MRSA BY PCR: NEGATIVE

## 2015-10-30 MED ORDER — SODIUM CHLORIDE 0.9 % IV SOLN: INTRAVENOUS | Status: DC

## 2015-10-30 MED ORDER — THIAMINE HCL 100 MG/ML IJ SOLN
100.0000 mg | Freq: Every day | INTRAMUSCULAR | Status: DC
  Administered 2015-10-30 – 2015-10-31 (×2): 100 mg via INTRAVENOUS
  Filled 2015-10-30 (×2): qty 2

## 2015-10-30 MED ORDER — DEXTROSE-NACL 5-0.9 % IV SOLN
INTRAVENOUS | Status: DC
  Administered 2015-10-30 – 2015-10-31 (×2): via INTRAVENOUS

## 2015-10-30 MED ORDER — INSULIN GLARGINE 100 UNIT/ML ~~LOC~~ SOLN
15.0000 [IU] | Freq: Every day | SUBCUTANEOUS | Status: DC
  Administered 2015-10-31 – 2015-11-08 (×9): 15 [IU] via SUBCUTANEOUS
  Filled 2015-10-30 (×13): qty 0.15

## 2015-10-30 NOTE — Care Management Note (Signed)
Case Management Note  Patient Details  Name: Shane Dougherty MRN: [id number] Date of Birth: 1975/11/06  Subjective/Objective:                    Action/Plan:   Expected Discharge Date:  10/30/15               Expected Discharge Plan:     In-House Referral:     Discharge planning Services     Post Acute Care Choice:    Choice offered to:     DME Arranged:    DME Agency:     HH Arranged:    HH Agency:     Status of Service:  In process, will continue to follow  Medicare Important Message Given:    Date Medicare IM Given:    Medicare IM give by:    Date Additional Medicare IM Given:    Additional Medicare Important Message give by:     If discussed at Long Length of Stay Meetings, dates discussed:    Additional Comments:  Kingsley Plan, RN 10/30/2015, 8:58 AM

## 2015-10-30 NOTE — Progress Notes (Signed)
Echocardiogram 2D Echocardiogram has been performed.  Shane Dougherty 10/30/2015, 2:57 PM

## 2015-10-30 NOTE — Progress Notes (Signed)
PT Cancellation Note  Patient Details Name: Shane Dougherty MRN: [id number] DOB: [birth year]   Cancelled Treatment:    Reason Eval/Treat Not Completed: Medical issues which prohibited therapy, pt transferred to step down. Will hold therapy and check back tomorrow.    [name], [place] 10/30/2015, 1:03 PM

## 2015-10-30 NOTE — Progress Notes (Signed)
3 Days Post-Op  Subjective: He looks and feels miserable., pain right shoulder, abdomen, buttocks, he cannot raise either leg this AM, and that is new,  Sounds like the feeling in his legs were an issue and he uses a cane at times for walking stability in the past.  This Am he cannot lift either leg off the bed.    Objective: Vital signs in last 24 hours: Temp:  [98.3 F (36.8 C)-99.1 F (37.3 C)] 98.6 F (37 C) (04/24 0521) Pulse Rate:  [88-100] 88 (04/24 0521) Resp:  [15-16] 15 (04/24 0521) BP: (101-127)/(58-74) 101/58 mmHg (04/24 0521) SpO2:  [96 %-97 %] 97 % (04/24 0521) Last BM Date: 10/30/15 PO 560 Urine 330 Stool x 7 Afebrile, VSS WBC 49.9K  Still rising Creatinine is up to 4.57 yesterday B 12 - 3293 CXR 4/23_  Small bilateral effusions, patchy opacity both lower lobes/atelectasis CT scan 4/23: Prominent superficial subcutaneous fat stranding in the bilateral lower gluteal cleft surrounding a superficial surgical drain, with no focal drainable superficial or intra-abdominal fluid collections. 2. Approximately 10 cm in length small bowel loop in the mid to distal small bowel with circumferential small bowel wall thickening, suggesting active enteritis. Symmetric erosive sacroiliitis. Normal large bowel. This constellation of findings is most suggestive of Crohn disease (not ulcerative colitis). 3. Third-spacing, including small bilateral pleural effusions, small pericardial effusion, small volume ascites and anasarca.  Intake/Output from previous day: 04/23 0701 - 04/24 0700 In: 1540 [P.O.:560; IV Piggyback:500] Out: 330 [Urine:330] Intake/Output this shift:    General appearance: alert, cooperative, mild distress and he feels terrible. Abdomen: soft somewhat tender to palpation on the left upper and lower abdomen,  + BS,  Having loose stools Left buttocks :  Penrose drain in place, drainage from the loose stools, the tissue below the penrose looks a bit necrotic,  but no drainage can be expressed.  He has an ulcer that is necrotic just below the drain that is 2.5-3 cm in diameter, this also has some non viable tissue at the base. Penis with catheter in place, mid shaft swelling, Dr. Luisa Hart and I reduced his phimosis and have the foreskin back down. Lower legs: he cannot move lower legs or even toes.   Lab Results:   Recent Labs  10/29/15 0311 10/30/15 0913  WBC 46.0* 49.9*  HGB 8.5* 8.3*  HCT 26.4* 25.9*  PLT 479* 438*    BMET  Recent Labs  10/29/15 1614 10/30/15 0603  NA 132* 131*  K 3.6 3.5  CL 104 101  CO2 13* 13*  GLUCOSE 98 26*  BUN 40* 44*  CREATININE 4.29* 4.57*  CALCIUM 7.4* 7.4*   PT/INR No results for input(s): LABPROT, INR in the last 72 hours.   Recent Labs Lab 10/29/15 0311 10/29/15 1614 10/30/15 0603  AST 30  --   --   ALT 8*  --   --   ALKPHOS 521*  --   --   BILITOT 1.0  --   --   PROT 5.2*  --   --   ALBUMIN 1.0* <1.0* 1.0*     Lipase     Component Value Date/Time   LIPASE 39 08/29/2015 1929     Studies/Results: Ct Abdomen Pelvis Wo Contrast  10/29/2015  CLINICAL DATA:  Inflammatory bowel disease. Left gluteal abscess. Prominent leukocytosis. Renal failure. Inpatient. EXAM: CT ABDOMEN AND PELVIS WITHOUT CONTRAST TECHNIQUE: Multidetector CT imaging of the abdomen and pelvis was performed following the standard protocol without IV contrast.  COMPARISON:  No prior CT abdomen/ pelvis. 10/28/2015 renal sonogram. FINDINGS: Examination is limited by streak artifact from the patient's upper extremities. Lower chest: Small bilateral pleural effusions with associated compressive atelectasis in the dependent lower lobes. Small pericardial effusion/ thickening. Hepatobiliary: Normal liver with no liver mass. Normal gallbladder with no radiopaque cholelithiasis. No biliary ductal dilatation. Pancreas: Normal, with no mass or duct dilation. Spleen: Normal size. No mass. Adrenals/Urinary Tract: Normal adrenals. No  hydronephrosis. No renal stones. No contour deforming renal mass. Urinary bladder is completely collapsed by indwelling Foley catheter and not well evaluated. Gas in the nondependent bladder lumen is expected given instrumentation. Stomach/Bowel: Grossly normal stomach. There is an approximately 10 cm length segment of mid to distal small bowel in the anterior lower abdomen (series 2/image 77) demonstrating mild-to-moderate circumferential bowel wall thickening. Normal caliber small bowel. Oral contrast reaches the distal rectum. Normal appendix. Normal large bowel with no diverticulosis, large bowel wall thickening or pericolonic fat stranding. Vascular/Lymphatic: Atherosclerotic nonaneurysmal abdominal aorta. Mild bilateral inguinal lymphadenopathy, largest 1.6 cm on the right (series 2/ image 99) and 1.6 cm on the left (series 2/ image 101. No additional pathologically enlarged lymph nodes in the abdomen or pelvis. Reproductive: Top-normal size prostate. Other: No pneumoperitoneum. Small volume abdominopelvic ascites. Musculoskeletal: No aggressive appearing focal osseous lesions. There are multiple small erosions along the sacroiliac joints bilaterally, in keeping with sacroiliitis. Symmetric mild gynecomastia. Anasarca. There is superficial subcutaneous fat stranding in the bilateral lower gluteal cleft surrounding a superficial surgical drain, with no focal drainable superficial or intra-abdominal fluid collections. IMPRESSION: 1. Prominent superficial subcutaneous fat stranding in the bilateral lower gluteal cleft surrounding a superficial surgical drain, with no focal drainable superficial or intra-abdominal fluid collections. 2. Approximately 10 cm in length small bowel loop in the mid to distal small bowel with circumferential small bowel wall thickening, suggesting active enteritis. Symmetric erosive sacroiliitis. Normal large bowel. This constellation of findings is most suggestive of Crohn disease (not  ulcerative colitis). 3. Third-spacing, including small bilateral pleural effusions, small pericardial effusion, small volume ascites and anasarca. 4. Mild bilateral inguinal lymphadenopathy, nonspecific. Electronically Signed   By: Delbert Phenix M.D.   On: 10/29/2015 15:48   Dg Chest 2 View  10/29/2015  CLINICAL DATA:  Dyspnea EXAM: CHEST  2 VIEW COMPARISON:  08/29/2015 chest radiograph. FINDINGS: Low lung volumes. Stable cardiomediastinal silhouette with normal heart size. No pneumothorax. Small bilateral pleural effusions. No pulmonary edema. Patchy opacity in both lower lobes. IMPRESSION: 1. Small bilateral pleural effusions. 2. Patchy opacity in both lower lobes, favor atelectasis. Electronically Signed   By: Delbert Phenix M.D.   On: 10/29/2015 19:37   US Renal  10/28/2015  CLINICAL DATA:  Acute renal failure EXAM: RENAL / URINARY TRACT ULTRASOUND COMPLETE COMPARISON:  None. FINDINGS: Right Kidney: Length: 13.0 cm. Echogenicity within normal limits. No mass or hydronephrosis visualized. Left Kidney: Length: 13.0 cm. Echogenicity within normal limits. No mass or hydronephrosis visualized. Bladder: Foley catheter in position. Bilateral pleural effusions. IMPRESSION: 1. Normal kidneys. 2. Bilateral pleural effusions Electronically Signed   By: Genevive Bi M.D.   On: 10/28/2015 17:37    Medications: . docusate sodium  100 mg Oral BID  . heparin  5,000 Units Subcutaneous Q8H  . insulin aspart  0-9 Units Subcutaneous TID WC  . insulin glargine  15 Units Subcutaneous QHS  . metronidazole  500 mg Intravenous Q8H  . piperacillin-tazobactam (ZOSYN)  IV  3.375 g Intravenous Q8H  . thiamine IV  100  mg Intravenous Daily  . vancomycin  750 mg Intravenous Q24H   Prior to Admission medications   Medication Sig Start Date End Date Taking? Authorizing Provider  acetaminophen (TYLENOL) 500 MG tablet Take 500-1,000 mg by mouth every 6 (six) hours as needed for mild pain or moderate pain.   Yes Historical  Provider, MD  clindamycin (CLEOCIN) 300 MG capsule Take 1 capsule (300 mg total) by mouth 4 (four) times daily. X 7 days 10/22/15  Yes Mancel Bale, MD  HYDROcodone-acetaminophen (NORCO) 5-325 MG tablet Take 1-2 tablets by mouth every 4 (four) hours as needed. Patient taking differently: Take 1-2 tablets by mouth every 4 (four) hours as needed for moderate pain.  10/22/15  Yes Mancel Bale, MD  insulin glargine (LANTUS) 100 UNIT/ML injection Inject 0.25 mLs (25 Units total) into the skin at bedtime. 08/11/15  Yes Jaclyn Shaggy, MD  insulin lispro (HUMALOG) 100 UNIT/ML injection Inject 0.1 mLs (10 Units total) into the skin 3 (three) times daily with meals. Your sliding scale with meals. 08/22/15  Yes Jaclyn Shaggy, MD  polyvinyl alcohol (LIQUIFILM TEARS) 1.4 % ophthalmic solution Place 3-4 drops into both eyes as needed for dry eyes.   Yes Historical Provider, MD      Assessment/Plan Left gluteal abscess S/p I&D of left Gluteal abscess, 11/01/2015, Dr. Sheliah Hatch AODM ID (hypoglycemia yesterday) AKI with olguria/possible ATN Leukocytosis Anemia  ? Crohn's disease on CT Hx of Bell's Palsy  Bilateral Lower leg weakness Phimosis - reduced FEN:  Carb Mod diet ID:  Day 5 Vancomycin/Zosyn VTE:  Heparin/SCD   Plan:  I have paged Hospitalist and I have seen him with Dr. Luisa Hart.  I will make him NPO.  I cannot explain his WBC based on exam of the rectum or abdomen.  We will take him to the OR tomorrow and do an exam under anesthesia, open the sites more.  I have reduced his phimosis, That does not explain rising WBC or issue with his legs.   I am starting some fluids on him now with dextrose also.        LOS: 4 days    Naven Giambalvo 10/30/2015 [phone]

## 2015-10-30 NOTE — Progress Notes (Signed)
Pt transferred to 2C11.

## 2015-10-30 NOTE — Consult Note (Signed)
Referring Provider: No ref. provider found Primary Care Physician:  Jaclyn Shaggy, MD Primary Gastroenterologist:  Gentry Fitz  Reason for Consultation:  Crohn's disease  HPI: Shane Dougherty is a 40 y.o. male with PMH of IDDM and CKD who presented to [facility] hospital on 4/20 for complaints of severe pain pain in his buttocks.  He was seen at [facility] on 4/16 at which time he had I&D of a small intergluteal abscess in the ED.  Was placed on clindamycin and discharged from the ED.  Pain continued to worsen so came back to the ED on 4/20.  Underwent surgical I&D on 4/21 by [facility], Dr. Sheliah Hatch and has a drain placed.  Has significantly elevated WBC count, today 50K.  Is on vancomycin and zosyn.  CT scan performed without contrast yesterday, 4/23 and showed the following:  IMPRESSION: 1. Prominent superficial subcutaneous fat stranding in the bilateral lower gluteal cleft surrounding a superficial surgical drain, with no focal drainable superficial or intra-abdominal fluid collections. 2. Approximately 10 cm in length small bowel loop in the mid to distal small bowel with circumferential small bowel wall thickening, suggesting active enteritis. Symmetric erosive sacroiliitis. Normal large bowel. This constellation of findings is most suggestive of Crohn disease (not ulcerative colitis). 3. Third-spacing, including small bilateral pleural effusions, small pericardial effusion, small volume ascites and anasarca. 4. Mild bilateral inguinal lymphadenopathy, nonspecific.  GI consulted for concern of Crohn's disease.  Patient complains of diarrhea for the past 2 months or so described as loose-watery stools up to 5 times per day (after each time he would eat).  Denies having abdominal pain and no rectal bleeding.  No history of similar issues with diarrhea in the past.    Currently has no abdominal pain.  Is concerned and tearful because he cannot move his left leg/toes.  MRI lumbar spine has been ordered.   Past  Medical History  Diagnosis Date  . Diabetes mellitus without complication (HCC)   . Bell's palsy 08/2014  . CKD (chronic kidney disease)     Past Surgical History  Procedure Laterality Date  . Incise and drain abcess  10/28/2015    GLUTEAL    Prior to Admission medications   Medication Sig Start Date End Date Taking? Authorizing Provider  acetaminophen (TYLENOL) 500 MG tablet Take 500-1,000 mg by mouth every 6 (six) hours as needed for mild pain or moderate pain.   Yes Historical Provider, MD  clindamycin (CLEOCIN) 300 MG capsule Take 1 capsule (300 mg total) by mouth 4 (four) times daily. X 7 days 10/22/15  Yes Mancel Bale, MD  HYDROcodone-acetaminophen (NORCO) 5-325 MG tablet Take 1-2 tablets by mouth every 4 (four) hours as needed. Patient taking differently: Take 1-2 tablets by mouth every 4 (four) hours as needed for moderate pain.  10/22/15  Yes Mancel Bale, MD  insulin glargine (LANTUS) 100 UNIT/ML injection Inject 0.25 mLs (25 Units total) into the skin at bedtime. 08/11/15  Yes Jaclyn Shaggy, MD  insulin lispro (HUMALOG) 100 UNIT/ML injection Inject 0.1 mLs (10 Units total) into the skin 3 (three) times daily with meals. Your sliding scale with meals. 08/22/15  Yes Jaclyn Shaggy, MD  polyvinyl alcohol (LIQUIFILM TEARS) 1.4 % ophthalmic solution Place 3-4 drops into both eyes as needed for dry eyes.   Yes Historical Provider, MD    Current Facility-Administered Medications  Medication Dose Route Frequency Provider Last Rate Last Dose  . docusate sodium (COLACE) capsule 100 mg  100 mg Oral BID Kaheem Art,  DO   100 mg at 10/29/15 0813  . heparin injection 5,000 Units  5,000 Units Subcutaneous Q8H Kacey Art, DO   5,000 Units at 10/30/15 5732  . HYDROcodone-acetaminophen (NORCO/VICODIN) 5-325 MG per tablet 1-2 tablet  1-2 tablet Oral Q4H PRN Zailyn Art, DO   2 tablet at 10/30/15 0818  . insulin aspart (novoLOG) injection 0-9 Units  0-9 Units Subcutaneous TID WC Daymen Art, DO   1 Units at 10/28/15 1759  . insulin glargine (LANTUS) injection 15 Units  15 Units Subcutaneous QHS Ved Art, DO      . metroNIDAZOLE (FLAGYL) IVPB 500 mg  500 mg Intravenous Q8H Jessica U Vann, DO   500 mg at 10/30/15 0407  . ondansetron (ZOFRAN) tablet 4 mg  4 mg Oral Q6H PRN Jerrin Art, DO       Or  . ondansetron (ZOFRAN) injection 4 mg  4 mg Intravenous Q6H PRN Gage Art, DO   4 mg at 10/28/15 0104  . piperacillin-tazobactam (ZOSYN) IVPB 3.375 g  3.375 g Intravenous Q8H Scarlett Presto, RPH   3.375 g at 10/30/15 1015  . senna-docusate (Senokot-S) tablet 1 tablet  1 tablet Oral QHS PRN Alexsandro Art, DO   1 tablet at 10/07/2015 2047  . thiamine (B-1) injection 100 mg  100 mg Intravenous Daily Mareo Art, DO   100 mg at 10/30/15 1014  . vancomycin (VANCOCIN) IVPB 750 mg/150 ml premix  750 mg Intravenous Q24H Candis Schatz Mancheril, RPH   750 mg at 10/30/15 0800    Allergies as of 10/23/2015 - Review Complete 10/16/2015  Allergen Reaction Noted  . Bee venom Swelling 08/20/2014    Family History  Problem Relation Age of Onset  . CAD Mother     Social History   Social History  . Marital Status: Single    Spouse Name: N/A  . Number of Children: N/A  . Years of Education: N/A   Occupational History  . Not on file.   Social History Main Topics  . Smoking status: Passive Smoke Exposure - Never Smoker  . Smokeless tobacco: Never Used  . Alcohol Use: Yes     Comment: occasiONAL  . Drug Use: No  . Sexual Activity: Not on file   Other Topics Concern  . Not on file   Social History Narrative    Review of Systems: Ten point ROS is O/W negative except as mentioned in HPI.  Physical Exam: Vital signs in last 24 hours: Temp:  [98.3 F (36.8 C)-99.1 F (37.3 C)] 98.6 F (37 C) (04/24 1040) Pulse Rate:  [83-100] 83 (04/24 1040) Resp:  [15-16] 16 (04/24 1040) BP: (98-127)/(58-74) 98/62 mmHg (04/24 1040) SpO2:  [96 %-100 %] 100 % (04/24 1040) Last  BM Date: 10/30/15 General:  Alert, Well-developed, well-nourished, pleasant and cooperative in NAD; tearful, appears uncomfortable but denies pain currently. Head:  Normocephalic and atraumatic. Eyes:  Sclera clear, no icterus.  Conjunctiva pink. Ears:  Normal auditory acuity. Mouth:  No deformity or lesions.   Lungs:  Clear throughout to auscultation.  No wheezes, crackles, or rhonchi.  Heart:  Regular rate and rhythm; no murmurs, clicks, rubs, or gallops. Abdomen:  Soft, slightly distended.  BS present.  Mild/minimal LUQ TTP. Rectal:  Not performed. Msk:  Symmetrical without gross deformities. Pulses:  Normal pulses noted. Extremities:  Without clubbing or edema. Neurologic:  Alert and oriented x 4; sensation intact on B/L LE's but unable to move  left leg/toes. Skin:  Intact without significant lesions or rashes. Psych:  Alert and cooperative. Normal mood and affect.  Intake/Output from previous day: 04/23 0701 - 04/24 0700 In: 1540 [P.O.:560; IV Piggyback:500] Out: 330 [Urine:330] Intake/Output this shift: Total I/O In: 480 [P.O.:480] Out: -   Lab Results:  Recent Labs  10/29/15 10/29/15 0311 10/30/15 0913  WBC 42.7* 46.0* 49.9*  HGB 8.7* 8.5* 8.3*  HCT 26.5* 26.4* 25.9*  PLT 450* 479* 438*   BMET  Recent Labs  10/29/15 0311 10/29/15 1614 10/30/15 0603  NA 135 132* 131*  K 3.5 3.6 3.5  CL 105 104 101  CO2 15* 13* 13*  GLUCOSE 108* 98 26*  BUN 38* 40* 44*  CREATININE 3.88* 4.29* 4.57*  CALCIUM 7.2* 7.4* 7.4*   LFT  Recent Labs  10/29/15 0311  10/30/15 0603  PROT 5.2*  --   --   ALBUMIN 1.0*  < > 1.0*  AST 30  --   --   ALT 8*  --   --   ALKPHOS 521*  --   --   BILITOT 1.0  --   --   BILIDIR 0.4  --   --   IBILI 0.6  --   --   < > = values in this interval not displayed.  Studies/Results: Ct Abdomen Pelvis Wo Contrast  10/29/2015  CLINICAL DATA:  Inflammatory bowel disease. Left gluteal abscess. Prominent leukocytosis. Renal failure. Inpatient.  EXAM: CT ABDOMEN AND PELVIS WITHOUT CONTRAST TECHNIQUE: Multidetector CT imaging of the abdomen and pelvis was performed following the standard protocol without IV contrast. COMPARISON:  No prior CT abdomen/ pelvis. 10/28/2015 renal sonogram. FINDINGS: Examination is limited by streak artifact from the patient's upper extremities. Lower chest: Small bilateral pleural effusions with associated compressive atelectasis in the dependent lower lobes. Small pericardial effusion/ thickening. Hepatobiliary: Normal liver with no liver mass. Normal gallbladder with no radiopaque cholelithiasis. No biliary ductal dilatation. Pancreas: Normal, with no mass or duct dilation. Spleen: Normal size. No mass. Adrenals/Urinary Tract: Normal adrenals. No hydronephrosis. No renal stones. No contour deforming renal mass. Urinary bladder is completely collapsed by indwelling Foley catheter and not well evaluated. Gas in the nondependent bladder lumen is expected given instrumentation. Stomach/Bowel: Grossly normal stomach. There is an approximately 10 cm length segment of mid to distal small bowel in the anterior lower abdomen (series 2/image 77) demonstrating mild-to-moderate circumferential bowel wall thickening. Normal caliber small bowel. Oral contrast reaches the distal rectum. Normal appendix. Normal large bowel with no diverticulosis, large bowel wall thickening or pericolonic fat stranding. Vascular/Lymphatic: Atherosclerotic nonaneurysmal abdominal aorta. Mild bilateral inguinal lymphadenopathy, largest 1.6 cm on the right (series 2/ image 99) and 1.6 cm on the left (series 2/ image 101. No additional pathologically enlarged lymph nodes in the abdomen or pelvis. Reproductive: Top-normal size prostate. Other: No pneumoperitoneum. Small volume abdominopelvic ascites. Musculoskeletal: No aggressive appearing focal osseous lesions. There are multiple small erosions along the sacroiliac joints bilaterally, in keeping with  sacroiliitis. Symmetric mild gynecomastia. Anasarca. There is superficial subcutaneous fat stranding in the bilateral lower gluteal cleft surrounding a superficial surgical drain, with no focal drainable superficial or intra-abdominal fluid collections. IMPRESSION: 1. Prominent superficial subcutaneous fat stranding in the bilateral lower gluteal cleft surrounding a superficial surgical drain, with no focal drainable superficial or intra-abdominal fluid collections. 2. Approximately 10 cm in length small bowel loop in the mid to distal small bowel with circumferential small bowel wall thickening, suggesting active enteritis. Symmetric erosive sacroiliitis. Normal  large bowel. This constellation of findings is most suggestive of Crohn disease (not ulcerative colitis). 3. Third-spacing, including small bilateral pleural effusions, small pericardial effusion, small volume ascites and anasarca. 4. Mild bilateral inguinal lymphadenopathy, nonspecific. Electronically Signed   By: Delbert Phenix M.D.   On: 10/29/2015 15:48   Dg Chest 2 View  10/29/2015  CLINICAL DATA:  Dyspnea EXAM: CHEST  2 VIEW COMPARISON:  08/29/2015 chest radiograph. FINDINGS: Low lung volumes. Stable cardiomediastinal silhouette with normal heart size. No pneumothorax. Small bilateral pleural effusions. No pulmonary edema. Patchy opacity in both lower lobes. IMPRESSION: 1. Small bilateral pleural effusions. 2. Patchy opacity in both lower lobes, favor atelectasis. Electronically Signed   By: Delbert Phenix M.D.   On: 10/29/2015 19:37   US Renal  10/28/2015  CLINICAL DATA:  Acute renal failure EXAM: RENAL / URINARY TRACT ULTRASOUND COMPLETE COMPARISON:  None. FINDINGS: Right Kidney: Length: 13.0 cm. Echogenicity within normal limits. No mass or hydronephrosis visualized. Left Kidney: Length: 13.0 cm. Echogenicity within normal limits. No mass or hydronephrosis visualized. Bladder: Foley catheter in position. Bilateral pleural effusions. IMPRESSION:  1. Normal kidneys. 2. Bilateral pleural effusions Electronically Signed   By: Genevive Bi M.D.   On: 10/28/2015 17:37   IMPRESSION:  1.  Peri-rectal/intergluteal abscess s/p ID with drain in place.  On Vancomycin and Zosyn.   2.  CT imaging showing small bowel wall thickening/enteritis with concern for Crohn's disease.  Had diarrhea for 1-2 months prior to this admission.   3.  LE weakness, much greater in the left:  MRI lumbar spine pending. 4.  AoCKD 5.  Uncontrolled IDDM 6.  Significant leukocytosis:  Secondary to above.  Blood cultures pending. 7.  Acute on chronic anemia:  Looks like Hgb has been starting to run low since fall 2016.  PLAN: -Aggressive treat the abscess. -Further evaluation of small bowel findings once he improves acutely.  Can consider SBFT vs MR enterography.  ZEHR, JESSICA D.  10/30/2015, 10:51 AM  Pager number [phone]  GI ATTENDING  History, laboratories, x-rays reviewed. Patient personally seen and examined. Agree with comprehensive consultation note as outlined above. Patient with a ten-year history of insulin requiring diabetes mellitus. Presents with significant perirectal abscess requiring surgical intervention. On broad-spectrum antibiotics. Was having diarrhea with elevated white blood cell count. Testing for Clostridium difficile negative. We are asked to see him based on an unenhanced CT of the abdomen and pelvis revealing what appears to be a focal 10 cm segment of thickened mid to distal small bowel without surrounding inflammation or obstruction. The question of Crohn's disease raised. In the months leading up to this admission, patient has had no abdominal complaints. He has had some postprandial urgency with loose stools. No bleeding. No weight loss. It is not clear to me that he has Crohn's based on this isolated imaging study. He certainly could however, and Crohn's patients are obviously at risk for perirectal abscess (as are diabetics). There is no  indication for further workup or empiric Crohn's therapies at this time (most of which are immune suppressing and would be contraindicated in the face of infection). At this point we recommend primary treatment of his perirectal abscess and evaluation of neurologic complaints of left leg as you are doing. Once these issues have sufficiently resolved, and hopefully his renal insufficiency will have improved, I would certainly recommend more advanced imaging to evaluate small bowel such as CT enterography or MR enterography. Could also consider capsule endoscopy. Again, would NOT proceed  with any of this at this time. GI is happy to see him as an outpatient once his acute issues have resolved. Please let us know. I have discussed this in detail with the patient. Available as needed. Thank you.  Wilhemina Bonito. Eda Keys., M.D. [facility]

## 2015-10-30 NOTE — Progress Notes (Signed)
CRITICAL VALUE ALERT  Critical value received:  Glucose 26  Date of notification:  10/30/2015   Time of notification:  7:56 AM   Critical value read back:Yes.    Nurse who received alert:  Earna Coder, RN  MD notified (1st page):  Benjamine Mola  Time of first page:  7:56 AM   MD notified (2nd page):  Time of second page:  Responding MD:  Benjamine Mola  Time MD responded:  [phone]

## 2015-10-30 NOTE — Progress Notes (Signed)
PROGRESS NOTE    Shane Dougherty  ZOX:[id number] DOB: 1976-01-05 DOA: 10-31-2015 PCP: Jaclyn Shaggy, MD   Outpatient Specialists:      Brief Narrative:  Shane Dougherty is a 40 y.o. male with medical history significant of diabetes. He complains for diarrhea x 1 month. Nothing made better or worse. No history of crohn's/UC- no family hx either. He then a few days before 4/16 developed left buttock pain. He was seen in the ER where he was diagnosed with intergluteal infection with small abscess. An I/D was done that some purulent drainage. He was given norco, clindamycin and told to do warm soaks.  He presented back at [facility] [place] with worsening pain and pressure. tx to [facility] for further I&D in operating room.  Had urinary retention and foley placed.  Creatine and Urine output did not respond to fluid boluses.   Assessment & Plan:   Active Problems:   Hyperglycemia due to type 2 diabetes mellitus (HCC)   Hyponatremia   Essential hypertension   Diabetic neuropathy, type II diabetes mellitus (HCC)   Peri-rectal abscess   CKD (chronic kidney disease)   Leukocytosis   AKI (acute kidney injury) (HCC)   Peri-rectal abscess -s/p I&D -blood cultures- NGTD -culture from I&D: gram + cocci in pairs- group B strep -HIV negative -pain control - IV Abx- vanc and zosyn until cx back  ? 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 Need for lasix? -U/A ordered  Severe protein calorie malnutrition -albumin 1  Weakness -states he is unable to move LE (walked with cane before admission.Marland Kitchen) -sensation intact -B12 Thiamine level- IV thiamine -MRI of lumbar spine  Small pleural effusions -echo pending  DVT prophylaxis:  Lovenox  Code Status: Full Code   Family Communication:   Disposition Plan:  tx to SDU   Consultants:   CCS  nephrology  Procedures:   I&D     Subjective: Not feeling well-says last night he started being unable to move his legs  Objective: Filed Vitals:   10/29/15 2145 10/30/15 0110 10/30/15 0521 10/30/15 1040  BP: 127/74 111/66 101/58 98/62  Pulse: 98 96 88 83  Temp: 99.1 F (37.3 C) 98.6 F (37 C) 98.6 F (37 C) 98.6 F (37 C)  TempSrc: Oral Oral  Oral  Resp: 16 16 15 16   Height:      Weight:      SpO2: 96% 96% 97% 100%    Intake/Output Summary (Last 24 hours) at 10/30/15 1358 Last data filed at 10/30/15 1246  Gross per 24 hour  Intake   1140 ml  Output    400 ml  Net    740 ml   Filed Weights   October 31, 2015 1154 October 31, 2015 1800  Weight: 65.318 kg (144 lb) 67.132 kg (148 lb)    Examination:  General exam: tearful  Respiratory system: Clear to auscultation anterior- unable to sit up due to pain. Respiratory effort normal. Cardiovascular system: S1 & S2 heard, RRR. No JVD, murmurs, rubs, gallops or clicks. +edema in hands/feet Gastrointestinal system: Abdomen is nondistended, soft and nontender. No organomegaly or masses felt. Normal bowel sounds heard. Psychiatry: tearful     Data Reviewed: I have personally reviewed following labs and imaging studies  CBC:  Recent Labs Lab 10/09/2015 1318  10/28/2015 0547 10/28/15 0552 10/29/15 10/29/15 0311 10/30/15 0913  WBC 22.8*  < > 24.8* 33.9* 42.7* 46.0* 49.9*  NEUTROABS 20.7*  --   --   --   --    --   --   HGB 10.6*  < > 9.1* 8.3* 8.7* 8.5* 8.3*  HCT 31.6*  < > 28.6* 26.4* 26.5* 26.4* 25.9*  MCV 78.6  < > 79.4 80.2 79.8 81.7 79.0  PLT 453*  < > 490* 436* 450* 479* 438*  < > = values in this interval not displayed. Basic Metabolic Panel:  Recent Labs Lab 10/28/15 0552 10/29/15 10/29/15 0311 10/29/15 1614 10/30/15 0603  NA 133* 135 135 132* 131*  K 3.5 3.9 3.5 3.6 3.5  CL 103 107 105 104 101  CO2 17* 15* 15* 13* 13*  GLUCOSE 92 107* 108* 98 26*  BUN 35* 38* 38* 40* 44*  CREATININE 3.01* 3.76* 3.88* 4.29* 4.57*  CALCIUM 7.5* 7.5* 7.2* 7.4* 7.4*  PHOS  --   --   --  6.0* 6.6*   GFR: Estimated Creatinine Clearance: 20.1 mL/min (by C-G formula based on Cr of 4.57). Liver Function Tests:  Recent Labs Lab 10/29/15 0311 10/29/15 1614 10/30/15 0603  AST 30  --   --   ALT 8*  --   --   ALKPHOS 521*  --   --   BILITOT 1.0  --   --   PROT 5.2*  --   --   ALBUMIN 1.0* <1.0* 1.0*   No results for input(s): LIPASE, AMYLASE in the last 168 hours. No results for input(s): AMMONIA in the last 168 hours. Coagulation Profile: No results for input(s): INR, PROTIME in the last 168 hours. Cardiac Enzymes: No results for input(s): CKTOTAL, CKMB, CKMBINDEX, TROPONINI in the last 168 hours. BNP (last 3 results) No results for input(s): PROBNP in the last 8760 hours. HbA1C: No results for input(s): HGBA1C in the last 72 hours. CBG:  Recent Labs Lab 10/29/15 1645 10/29/15 2157 10/30/15 0701 10/30/15 0751 10/30/15 1118  GLUCAP 90 112* 70 75 111*   Lipid Profile: No results for input(s): CHOL, HDL, LDLCALC, TRIG, CHOLHDL, LDLDIRECT in the last 72 hours. Thyroid Function Tests: No results for input(s): TSH, T4TOTAL, FREET4, T3FREE, THYROIDAB in the last 72 hours. Anemia Panel:  Recent Labs  10/30/15 0913  VITAMINB12 3293*   Urine analysis:    Component Value Date/Time   COLORURINE YELLOW 10/29/2015 1020   APPEARANCEUR TURBID* 10/29/2015 1020   LABSPEC 1.026  10/29/2015 1020   PHURINE 5.0 10/29/2015 1020   GLUCOSEU NEGATIVE 10/29/2015 1020   HGBUR MODERATE* 10/29/2015 1020   BILIRUBINUR NEGATIVE 10/29/2015 1020   KETONESUR NEGATIVE 10/29/2015 1020   PROTEINUR 100* 10/29/2015 1020   UROBILINOGEN 1.0 08/20/2014 1042   NITRITE NEGATIVE 10/29/2015 1020   LEUKOCYTESUR MODERATE* 10/29/2015 1020   Recent Results (from the past 240 hour(s))  Culture, blood (Routine X 2) w Reflex to ID Panel     Status: None (Preliminary result)   Collection Time:  11/03/2015  7:52 PM  Result Value Ref Range Status   Specimen Description BLOOD LEFT ANTECUBITAL  Final   Special Requests BOTTLES DRAWN AEROBIC ONLY 10CC  Final   Culture NO GROWTH 3 DAYS  Final   Report Status PENDING  Incomplete  Culture, blood (Routine X 2) w Reflex to ID Panel     Status: None (Preliminary result)   Collection Time: 10/13/2015  8:00 PM  Result Value Ref Range Status   Specimen Description BLOOD LEFT FOREARM  Final   Special Requests BOTTLES DRAWN AEROBIC ONLY 9CC  Final   Culture NO GROWTH 3 DAYS  Final   Report Status PENDING  Incomplete  Surgical pcr screen     Status: None   Collection Time: 10/19/2015  8:27 AM  Result Value Ref Range Status   MRSA, PCR NEGATIVE NEGATIVE Final   Staphylococcus aureus NEGATIVE NEGATIVE Final    Comment:        The Xpert SA Assay (FDA approved for NASAL specimens in patients over 61 years of age), is one component of a comprehensive surveillance program.  Test performance has been validated by [facility] for patients greater than or equal to 74 year old. It is not intended to diagnose infection nor to guide or monitor treatment.   Anaerobic culture     Status: None (Preliminary result)   Collection Time: 10/22/2015 11:27 AM  Result Value Ref Range Status   Specimen Description ABSCESS BUTTOCKS LEFT  Final   Special Requests NONE  Final   Gram Stain   Final    MODERATE WBC PRESENT,BOTH PMN AND MONONUCLEAR NO SQUAMOUS EPITHELIAL CELLS  SEEN FEW GRAM POSITIVE COCCI IN PAIRS Performed at [facility]    Culture   Final    NO ANAEROBES ISOLATED; CULTURE IN PROGRESS FOR 5 DAYS Performed at [facility]    Report Status PENDING  Incomplete  Culture, routine-abscess     Status: None   Collection Time: 10/24/2015 11:27 AM  Result Value Ref Range Status   Specimen Description ABSCESS BUTTOCKS LEFT  Final   Special Requests NONE  Final   Gram Stain   Final    MODERATE WBC PRESENT,BOTH PMN AND MONONUCLEAR NO SQUAMOUS EPITHELIAL CELLS SEEN FEW GRAM POSITIVE COCCI IN PAIRS Performed at [facility]    Culture   Final    MODERATE GROUP B STREP(S.AGALACTIAE)ISOLATED Note: Beta hemolytic streptococci are predictably susceptible to penicillin and other beta lactams. Susceptibility testing not routinely performed. Performed at [facility]    Report Status 10/30/2015 FINAL  Final  C difficile quick scan w PCR reflex     Status: None   Collection Time: 10/29/15  4:34 PM  Result Value Ref Range Status   C Diff antigen NEGATIVE NEGATIVE Final   C Diff toxin NEGATIVE NEGATIVE Final   C Diff interpretation Negative for toxigenic C. difficile  Final  Culture, Urine     Status: None (Preliminary result)   Collection Time: 10/29/15  7:36 PM  Result Value Ref Range Status   Specimen Description URINE, RANDOM  Final   Special Requests NONE  Final   Culture NO GROWTH < 24 HOURS  Final   Report Status PENDING  Incomplete      Anti-infectives    Start     Dose/Rate Route Frequency Ordered Stop   10/29/15 2000  metroNIDAZOLE (FLAGYL) IVPB 500 mg  Status:  Discontinued     500 mg 100 mL/hr over 60 Minutes Intravenous  Every 8 hours 10/29/15 1852 10/30/15 1312   10/29/15 0800  vancomycin (VANCOCIN) IVPB 750 mg/150 ml premix     750 mg 150 mL/hr over 60 Minutes Intravenous Every 24 hours 10/28/15 1058     10/24/2015 0800  vancomycin (VANCOCIN) 500 mg in sodium chloride 0.9 % 100 mL IVPB  Status:   Discontinued     500 mg 100 mL/hr over 60 Minutes Intravenous Every 12 hours 11/04/2015 1931 10/28/15 1058   11/04/2015 0200  piperacillin-tazobactam (ZOSYN) IVPB 3.375 g     3.375 g 12.5 mL/hr over 240 Minutes Intravenous Every 8 hours 10/28/2015 1938     10/30/2015 2000  piperacillin-tazobactam (ZOSYN) IVPB 3.375 g     3.375 g 100 mL/hr over 30 Minutes Intravenous  Once 10/14/2015 1903 11/01/2015 2117   10/08/2015 2000  vancomycin (VANCOCIN) IVPB 1000 mg/200 mL premix     1,000 mg 200 mL/hr over 60 Minutes Intravenous  Once 10/14/2015 1903 11/01/2015 2147       Radiology Studies: Ct Abdomen Pelvis Wo Contrast  10/29/2015  CLINICAL DATA:  Inflammatory bowel disease. Left gluteal abscess. Prominent leukocytosis. Renal failure. Inpatient. EXAM: CT ABDOMEN AND PELVIS WITHOUT CONTRAST TECHNIQUE: Multidetector CT imaging of the abdomen and pelvis was performed following the standard protocol without IV contrast. COMPARISON:  No prior CT abdomen/ pelvis. 10/28/2015 renal sonogram. FINDINGS: Examination is limited by streak artifact from the patient's upper extremities. Lower chest: Small bilateral pleural effusions with associated compressive atelectasis in the dependent lower lobes. Small pericardial effusion/ thickening. Hepatobiliary: Normal liver with no liver mass. Normal gallbladder with no radiopaque cholelithiasis. No biliary ductal dilatation. Pancreas: Normal, with no mass or duct dilation. Spleen: Normal size. No mass. Adrenals/Urinary Tract: Normal adrenals. No hydronephrosis. No renal stones. No contour deforming renal mass. Urinary bladder is completely collapsed by indwelling Foley catheter and not well evaluated. Gas in the nondependent bladder lumen is expected given instrumentation. Stomach/Bowel: Grossly normal stomach. There is an approximately 10 cm length segment of mid to distal small bowel in the anterior lower abdomen (series 2/image 77) demonstrating mild-to-moderate circumferential bowel wall  thickening. Normal caliber small bowel. Oral contrast reaches the distal rectum. Normal appendix. Normal large bowel with no diverticulosis, large bowel wall thickening or pericolonic fat stranding. Vascular/Lymphatic: Atherosclerotic nonaneurysmal abdominal aorta. Mild bilateral inguinal lymphadenopathy, largest 1.6 cm on the right (series 2/ image 99) and 1.6 cm on the left (series 2/ image 101. No additional pathologically enlarged lymph nodes in the abdomen or pelvis. Reproductive: Top-normal size prostate. Other: No pneumoperitoneum. Small volume abdominopelvic ascites. Musculoskeletal: No aggressive appearing focal osseous lesions. There are multiple small erosions along the sacroiliac joints bilaterally, in keeping with sacroiliitis. Symmetric mild gynecomastia. Anasarca. There is superficial subcutaneous fat stranding in the bilateral lower gluteal cleft surrounding a superficial surgical drain, with no focal drainable superficial or intra-abdominal fluid collections. IMPRESSION: 1. Prominent superficial subcutaneous fat stranding in the bilateral lower gluteal cleft surrounding a superficial surgical drain, with no focal drainable superficial or intra-abdominal fluid collections. 2. Approximately 10 cm in length small bowel loop in the mid to distal small bowel with circumferential small bowel wall thickening, suggesting active enteritis. Symmetric erosive sacroiliitis. Normal large bowel. This constellation of findings is most suggestive of Crohn disease (not ulcerative colitis). 3. Third-spacing, including small bilateral pleural effusions, small pericardial effusion, small volume ascites and anasarca. 4. Mild bilateral inguinal lymphadenopathy, nonspecific. Electronically Signed   By: Delbert Phenix M.D.   On: 10/29/2015 15:48   Dg Chest  2 View  10/29/2015  CLINICAL DATA:  Dyspnea EXAM: CHEST  2 VIEW COMPARISON:  08/29/2015 chest radiograph. FINDINGS: Low lung volumes. Stable cardiomediastinal  silhouette with normal heart size. No pneumothorax. Small bilateral pleural effusions. No pulmonary edema. Patchy opacity in both lower lobes. IMPRESSION: 1. Small bilateral pleural effusions. 2. Patchy opacity in both lower lobes, favor atelectasis. Electronically Signed   By: Delbert Phenix M.D.   On: 10/29/2015 19:37   US Renal  10/28/2015  CLINICAL DATA:  Acute renal failure EXAM: RENAL / URINARY TRACT ULTRASOUND COMPLETE COMPARISON:  None. FINDINGS: Right Kidney: Length: 13.0 cm. Echogenicity within normal limits. No mass or hydronephrosis visualized. Left Kidney: Length: 13.0 cm. Echogenicity within normal limits. No mass or hydronephrosis visualized. Bladder: Foley catheter in position. Bilateral pleural effusions. IMPRESSION: 1. Normal kidneys. 2. Bilateral pleural effusions Electronically Signed   By: Genevive Bi M.D.   On: 10/28/2015 17:37        Scheduled Meds: . docusate sodium  100 mg Oral BID  . heparin  5,000 Units Subcutaneous Q8H  . insulin aspart  0-9 Units Subcutaneous TID WC  . insulin glargine  15 Units Subcutaneous QHS  . piperacillin-tazobactam (ZOSYN)  IV  3.375 g Intravenous Q8H  . thiamine IV  100 mg Intravenous Daily  . vancomycin  750 mg Intravenous Q24H   Continuous Infusions: . dextrose 5 % and 0.9% NaCl 75 mL/hr at 10/30/15 1143     LOS: 4 days    Time spent: 25 min    Carmalita Wakefield Juanetta Gosling, DO [facility] Pager [phone]  If 7PM-7AM, please contact night-coverage [email or web address] Password [email or web address] 10/30/2015, 1:58 PM

## 2015-10-31 ENCOUNTER — Inpatient Hospital Stay (HOSPITAL_COMMUNITY): Payer: Managed Care, Other (non HMO)

## 2015-10-31 DIAGNOSIS — L8990 Pressure ulcer of unspecified site, unspecified stage: Secondary | ICD-10-CM | POA: Insufficient documentation

## 2015-10-31 DIAGNOSIS — R29898 Other symptoms and signs involving the musculoskeletal system: Secondary | ICD-10-CM

## 2015-10-31 LAB — RENAL FUNCTION PANEL
Anion gap: 13 (ref 5–15)
BUN: 45 mg/dL — AB (ref 6–20)
CALCIUM: 7.1 mg/dL — AB (ref 8.9–10.3)
CO2: 14 mmol/L — ABNORMAL LOW (ref 22–32)
CREATININE: 4.17 mg/dL — AB (ref 0.61–1.24)
Chloride: 105 mmol/L (ref 101–111)
GFR calc Af Amer: 19 mL/min — ABNORMAL LOW (ref >60)
GFR calc non Af Amer: 16 mL/min — ABNORMAL LOW (ref >60)
GLUCOSE: 101 mg/dL — AB (ref 65–99)
PHOSPHORUS: 6.6 mg/dL — AB (ref 2.5–4.6)
POTASSIUM: 3.3 mmol/L — AB (ref 3.5–5.1)
SODIUM: 132 mmol/L — AB (ref 135–145)

## 2015-10-31 LAB — CULTURE, BLOOD (ROUTINE X 2)
CULTURE: NO GROWTH
Culture: NO GROWTH

## 2015-10-31 LAB — TROPONIN I
TROPONIN I: 0.08 ng/mL — AB (ref <0.031)
Troponin I: 0.2 ng/mL — ABNORMAL HIGH (ref <0.031)

## 2015-10-31 LAB — GLUCOSE, CAPILLARY
GLUCOSE-CAPILLARY: 160 mg/dL — AB (ref 65–99)
GLUCOSE-CAPILLARY: 190 mg/dL — AB (ref 65–99)
Glucose-Capillary: 116 mg/dL — ABNORMAL HIGH (ref 65–99)
Glucose-Capillary: 159 mg/dL — ABNORMAL HIGH (ref 65–99)

## 2015-10-31 LAB — VANCOMYCIN, RANDOM: Vancomycin Rm: 33 ug/mL

## 2015-10-31 LAB — CBC
HCT: 24.9 % — ABNORMAL LOW (ref 39.0–52.0)
Hemoglobin: 8.5 g/dL — ABNORMAL LOW (ref 13.0–17.0)
MCH: 26.8 pg (ref 26.0–34.0)
MCHC: 34.1 g/dL (ref 30.0–36.0)
MCV: 78.5 fL (ref 78.0–100.0)
Platelets: 445 10*3/uL — ABNORMAL HIGH (ref 150–400)
RBC: 3.17 MIL/uL — ABNORMAL LOW (ref 4.22–5.81)
RDW: 14.2 % (ref 11.5–15.5)
WBC: 53.3 10*3/uL — AB (ref 4.0–10.5)

## 2015-10-31 LAB — VITAMIN D 25 HYDROXY (VIT D DEFICIENCY, FRACTURES): Vit D, 25-Hydroxy: 6 ng/mL — ABNORMAL LOW (ref 30.0–100.0)

## 2015-10-31 LAB — VITAMIN B1: Vitamin B1 (Thiamine): 233.6 nmol/L — ABNORMAL HIGH (ref 66.5–200.0)

## 2015-10-31 MED ORDER — VITAMIN D3 25 MCG (1000 UNIT) PO TABS
2000.0000 [IU] | ORAL_TABLET | Freq: Every day | ORAL | Status: DC
  Administered 2015-10-31 – 2015-11-13 (×14): 2000 [IU] via ORAL
  Filled 2015-10-31 (×15): qty 2

## 2015-10-31 MED ORDER — STERILE WATER FOR INJECTION IV SOLN: INTRAVENOUS | Status: AC

## 2015-10-31 MED ORDER — STERILE WATER FOR INJECTION IV SOLN
INTRAVENOUS | Status: DC
  Administered 2015-10-31: 14:00:00 via INTRAVENOUS
  Filled 2015-10-31 (×3): qty 850

## 2015-10-31 MED ORDER — DEXTROSE 5 % IV SOLN
INTRAVENOUS | Status: DC
  Administered 2015-10-31: 14:00:00 via INTRAVENOUS

## 2015-10-31 MED ORDER — WHITE PETROLATUM GEL
Status: AC
  Administered 2015-10-31: 08:00:00
  Filled 2015-10-31: qty 1

## 2015-10-31 MED ORDER — SODIUM CHLORIDE 0.9 % IV SOLN
1.5000 g | Freq: Two times a day (BID) | INTRAVENOUS | Status: DC
  Administered 2015-10-31 – 2015-11-03 (×5): 1.5 g via INTRAVENOUS
  Filled 2015-10-31 (×7): qty 1.5

## 2015-10-31 NOTE — Consult Note (Signed)
Subjective:  Doing well today. No new complaints. Objective Vital signs in last 24 hours: Filed Vitals:   10/31/15 0400 10/31/15 0600 10/31/15 0844 10/31/15 1200  BP: 121/75 133/73 111/61 102/73  Pulse: 96 100 83 92  Temp:   97.7 F (36.5 C) 98.4 F (36.9 C)  TempSrc:   Oral Oral  Resp: 18 20 10 13   Height:      Weight:      SpO2: 98% 96% 98% 96%   Weight change:   Intake/Output Summary (Last 24 hours) at 10/31/15 1326 Last data filed at 10/30/15 2000  Gross per 24 hour  Intake    450 ml  Output    300 ml  Net    150 ml    Assessment/ Plan: Pt is a 40 y.o. yo male who was admitted on 10/25/2015 with a left buttock abscess, noted to have AoCKD possibly 2/2 ATN from hypoperfusion vs sepsis OR post-strep GN from suspected strep infection (abscess).  Assessment/Plan: 1. Acute on Chronic Kidney Disease; possible ischemic ATN vs post-strep GN: (CKD likely 2/2 poorly controlled DM) Patient is slightly oliguric w/ UOP in 24hrs. Cr improved to 4.17 today, w/ Baseline around 1.6. No lasix for now. Obtaining labs to assess for Post-strep GN. Will follow.   2. BP/Volume: UOP ; up 11.5L since admission. BPs good now. 3. Anemia: Hgb 8.5 but stable. Will monitor. WBC incredibly high at 53.3.  4. Abscess: per surgery 5. Diarrhea: improved. C Diff PCR neg. Starting to sound suspicious w/ raising WBC, recent OP abx use, and diarrhea.  6. Diabetes: poorly controlled. Likely cause of CKD. Management per primary team.  Mickie Hillier    Labs: Basic Metabolic Panel:  Recent Labs Lab 10/29/15 1614 10/30/15 0603 10/31/15 0349  NA 132* 131* 132*  K 3.6 3.5 3.3*  CL 104 101 105  CO2 13* 13* 14*  GLUCOSE 98 26* 101*  BUN 40* 44* 45*  CREATININE 4.29* 4.57* 4.17*  CALCIUM 7.4* 7.4* 7.1*  PHOS 6.0* 6.6* 6.6*   Liver Function Tests:  Recent Labs Lab 10/29/15 0311 10/29/15 1614 10/30/15 0603 10/31/15 0349  AST 30  --   --   --   ALT 8*  --   --   --   ALKPHOS 521*  --   --    --   BILITOT 1.0  --   --   --   PROT 5.2*  --   --   --   ALBUMIN 1.0* <1.0* 1.0* <1.0*   No results for input(s): LIPASE, AMYLASE in the last 168 hours. No results for input(s): AMMONIA in the last 168 hours. CBC:  Recent Labs Lab 10/28/2015 1318  10/28/15 0552 10/29/15 10/29/15 0311 10/30/15 0913 10/31/15 0349  WBC 22.8*  < > 33.9* 42.7* 46.0* 49.9* 53.3*  NEUTROABS 20.7*  --   --   --   --   --   --   HGB 10.6*  < > 8.3* 8.7* 8.5* 8.3* 8.5*  HCT 31.6*  < > 26.4* 26.5* 26.4* 25.9* 24.9*  MCV 78.6  < > 80.2 79.8 81.7 79.0 78.5  PLT 453*  < > 436* 450* 479* 438* 445*  < > = values in this interval not displayed. Cardiac Enzymes: No results for input(s): CKTOTAL, CKMB, CKMBINDEX, TROPONINI in the last 168 hours. CBG:  Recent Labs Lab 10/30/15 1118 10/30/15 1728 10/30/15 2206 10/31/15 0846 10/31/15 1201  GLUCAP 111* 124* 99 160* 116*    Iron Studies:  No results for input(s): IRON, TIBC, TRANSFERRIN, FERRITIN in the last 72 hours. Studies/Results: Ct Abdomen Pelvis Wo Contrast  10/29/2015  CLINICAL DATA:  Inflammatory bowel disease. Left gluteal abscess. Prominent leukocytosis. Renal failure. Inpatient. EXAM: CT ABDOMEN AND PELVIS WITHOUT CONTRAST TECHNIQUE: Multidetector CT imaging of the abdomen and pelvis was performed following the standard protocol without IV contrast. COMPARISON:  No prior CT abdomen/ pelvis. 10/28/2015 renal sonogram. FINDINGS: Examination is limited by streak artifact from the patient's upper extremities. Lower chest: Small bilateral pleural effusions with associated compressive atelectasis in the dependent lower lobes. Small pericardial effusion/ thickening. Hepatobiliary: Normal liver with no liver mass. Normal gallbladder with no radiopaque cholelithiasis. No biliary ductal dilatation. Pancreas: Normal, with no mass or duct dilation. Spleen: Normal size. No mass. Adrenals/Urinary Tract: Normal adrenals. No hydronephrosis. No renal stones. No contour  deforming renal mass. Urinary bladder is completely collapsed by indwelling Foley catheter and not well evaluated. Gas in the nondependent bladder lumen is expected given instrumentation. Stomach/Bowel: Grossly normal stomach. There is an approximately 10 cm length segment of mid to distal small bowel in the anterior lower abdomen (series 2/image 77) demonstrating mild-to-moderate circumferential bowel wall thickening. Normal caliber small bowel. Oral contrast reaches the distal rectum. Normal appendix. Normal large bowel with no diverticulosis, large bowel wall thickening or pericolonic fat stranding. Vascular/Lymphatic: Atherosclerotic nonaneurysmal abdominal aorta. Mild bilateral inguinal lymphadenopathy, largest 1.6 cm on the right (series 2/ image 99) and 1.6 cm on the left (series 2/ image 101. No additional pathologically enlarged lymph nodes in the abdomen or pelvis. Reproductive: Top-normal size prostate. Other: No pneumoperitoneum. Small volume abdominopelvic ascites. Musculoskeletal: No aggressive appearing focal osseous lesions. There are multiple small erosions along the sacroiliac joints bilaterally, in keeping with sacroiliitis. Symmetric mild gynecomastia. Anasarca. There is superficial subcutaneous fat stranding in the bilateral lower gluteal cleft surrounding a superficial surgical drain, with no focal drainable superficial or intra-abdominal fluid collections. IMPRESSION: 1. Prominent superficial subcutaneous fat stranding in the bilateral lower gluteal cleft surrounding a superficial surgical drain, with no focal drainable superficial or intra-abdominal fluid collections. 2. Approximately 10 cm in length small bowel loop in the mid to distal small bowel with circumferential small bowel wall thickening, suggesting active enteritis. Symmetric erosive sacroiliitis. Normal large bowel. This constellation of findings is most suggestive of Crohn disease (not ulcerative colitis). 3. Third-spacing,  including small bilateral pleural effusions, small pericardial effusion, small volume ascites and anasarca. 4. Mild bilateral inguinal lymphadenopathy, nonspecific. Electronically Signed   By: Delbert Phenix M.D.   On: 10/29/2015 15:48   Dg Chest 2 View  10/29/2015  CLINICAL DATA:  Dyspnea EXAM: CHEST  2 VIEW COMPARISON:  08/29/2015 chest radiograph. FINDINGS: Low lung volumes. Stable cardiomediastinal silhouette with normal heart size. No pneumothorax. Small bilateral pleural effusions. No pulmonary edema. Patchy opacity in both lower lobes. IMPRESSION: 1. Small bilateral pleural effusions. 2. Patchy opacity in both lower lobes, favor atelectasis. Electronically Signed   By: Delbert Phenix M.D.   On: 10/29/2015 19:37   Mr Lumbar Spine Wo Contrast  10/31/2015  CLINICAL DATA:  Leukocytosis and fever with perirectal abscess status post irrigation and drainage x2. Lower extremity weakness with difficulty ambulating. History of chronic kidney disease and diabetes. EXAM: MRI LUMBAR SPINE WITHOUT CONTRAST TECHNIQUE: Multiplanar, multisequence MR imaging of the lumbar spine was performed. No intravenous contrast was administered. COMPARISON:  Abdominal pelvic CT 10/29/2015 FINDINGS: Segmentation: Conventional anatomy assumed, with the last open disc space designated L5-S1. Alignment:  Normal. Vertebrae: There is  generalized decreased T1 and T2 marrow signal throughout the visualized bones which is attributed to chronic kidney disease. No acute osseous findings. No evidence of pars defect or discitis. Small erosions along the sacroiliac joints are likely related to the patient's renal disease and secondary hyperparathyroidism. No evidence of sacroiliac joint effusion to suggest infection. Conus medullaris: Extends to the L1 level and appears normal. No evidence of epidural fluid collection. Paraspinal and other soft tissues: There is extensive edema throughout the paraspinal soft tissues, subcutaneous fat and  retroperitoneum. No focal fluid collections are seen to suggest infection. There is free pelvic fluid, similar to recent CT. Disc levels: All of the lumbar discs are well hydrated with maintained height. No evidence of disc herniation, spinal stenosis or nerve root encroachment. No significant facet arthropathy. IMPRESSION: 1. No evidence of spinal infection. Specifically, no evidence of discitis, facet joint effusion or epidural abscess. 2. No disc herniation, spinal stenosis or nerve root encroachment. 3. Extensive soft tissue edema throughout the subcutaneous, retroperitoneal and peritoneal soft tissues, consistent with anasarca, similar to recent CT. 4. Marrow and sacroiliac changes attributed to underlying renal disease. Electronically Signed   By: Carey Bullocks M.D.   On: 10/31/2015 11:30   Medications: Infusions: . dextrose    .  sodium bicarbonate 150 mEq in sterile water 1000 mL infusion      Scheduled Medications: . ampicillin-sulbactam (UNASYN) IV  1.5 g Intravenous Q12H  . cholecalciferol  2,000 Units Oral Daily  . docusate sodium  100 mg Oral BID  . heparin  5,000 Units Subcutaneous Q8H  . insulin aspart  0-9 Units Subcutaneous TID WC  . insulin glargine  15 Units Subcutaneous QHS  . thiamine IV  100 mg Intravenous Daily    have reviewed scheduled and prn medications.  Physical Exam: General -- oriented x3, pleasant and cooperative. HEENT -- Head is normocephalic. PERRLA. EOMI.  Chest -- good expansion. Lungs clear to auscultation. Cardiac -- RRR. No murmurs noted.  Abdomen -- soft, slightly distended, nontender. No masses palpable. Bowel sounds present.  Extremeties - +1 pitting edema in LE bilaterally. Dorsalis pedis pulses present and symmetrical.    Kathee Delton, MD,MS,  PGY2 10/31/2015 1:26 PM     I have seen and examined this patient and agree with plan as outlined by Dr. Wende Mott.  Pt with AKI/CKD in setting of abscess and SIRS.  Improving with IVF's and started  on isotonic bicarb to treat the associated metabolic acidosis.  UOP improving.  W/u for Acute GN as well as post-infectious GN underway.  Continue to follow UOP and daily Scr. Brinson Gutowski Shelbe Haglund,MD 10/31/2015 3:32 PM

## 2015-10-31 NOTE — Progress Notes (Signed)
PROGRESS NOTE    Shane Dougherty  WUJ:[id number] DOB: 1976-07-08 DOA: 10/18/2015 PCP: Jaclyn Shaggy, MD   Outpatient Specialists:      Brief Narrative:  Shane Dougherty is a 40 y.o. male with medical history significant of diabetes. He complains for diarrhea x 1 month. Nothing made better or worse. No history of crohn's/UC- no family hx either. He then a few days before 4/16 developed left buttock pain. He was seen in the ER where he was diagnosed with intergluteal infection with small abscess. An I/D was done that some purulent drainage. He was given norco, clindamycin and told to do warm soaks.  He presented back at [facility] [place] with worsening pain and pressure. tx to [facility] for further I&D in operating room.  Had urinary retention and foley placed.  Continued to worsen with AKI, swellling and then on 4/24 said he was not able to move his legs. MRI done and neurology consulted   Assessment & Plan:   Active Problems:   Hyperglycemia due to type 2 diabetes mellitus (HCC)   Hyponatremia   Essential hypertension   Diabetic neuropathy, type II diabetes mellitus (HCC)   Peri-rectal abscess   CKD (chronic kidney disease)   Leukocytosis   AKI (acute kidney injury) (HCC)   Peri-rectal abscess -s/p I&D -blood cultures- NGTD -culture from I&D: gram + cocci in pairs- group B strep -HIV negative -pain control - narrow to unasyn  ? 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 etiology   Acute kidney injury  on CKD stage 3 -baseline Cr around 1.6-1.8 -hold IVF (given 2L  bolus yesterday) -had I/O cath for urinary retention-- placed foley -renal U/S- no hydronephrosis but pleural effusions - nephrology consult as patient has 3rd spacing due to albumin?   Severe protein calorie malnutrition -albumin <1  Weakness -states he is unable to move LE (walked with cane before admission.Marland Kitchen) -sensation intact -B12 ok Thiamine level- IV thiamine -MRI of lumbar spine no sign of infection -vit d low- replace -neurology consult  Small pleural effusions -echo done   DVT prophylaxis:  Lovenox  Code Status: Full Code   Family Communication:   Disposition Plan:  tx to SDU   Consultants:   CCS  Nephrology  neurology  Procedures:   I&D Echo: Normal LV systolic function; grade 2 diastolic dysfunction; mild  TR; mildly elevated pulmonary pressure; small pericardial  effusion; respiratory variation across MV and IVC dilated; left pleural effusion.     Subjective: Still not able to move legs   Objective: Filed Vitals:   10/31/15 0200 10/31/15 0400 10/31/15 0600 10/31/15 0844  BP: 87/59 121/75 133/73 111/61  Pulse: 80 96 100 83  Temp:    97.7 F (36.5 C)  TempSrc:    Oral  Resp: 10 18 20 10   Height:      Weight:      SpO2: 98% 98% 96% 98%    Intake/Output Summary (Last 24 hours) at 10/31/15 1154 Last data filed at 10/30/15 2000  Gross per 24 hour  Intake 546.25 ml  Output  400 ml  Net 146.25 ml   Filed Weights   10/20/2015 1154 10/16/2015 1800  Weight: 65.318 kg (144 lb) 67.132 kg (148 lb)    Examination:  General exam: ill appearing Respiratory system: Clear to auscultation anterior- unable to sit up due to pain. Respiratory effort normal. Cardiovascular system: S1 & S2 heard, RRR. No JVD, murmurs, rubs, gallops or clicks. +edema in hands/feet Gastrointestinal system: Abdomen is nondistended, soft and nontender. No organomegaly or masses felt. Normal bowel sounds heard. Psychiatry: not as tearful Was able to move right  toe    Data Reviewed: I have personally reviewed following labs and imaging studies  CBC:  Recent Labs Lab 10/24/2015 1318  10/28/15 0552 10/29/15 10/29/15 0311 10/30/15 0913 10/31/15 0349  WBC 22.8*  < > 33.9* 42.7* 46.0* 49.9* 53.3*  NEUTROABS 20.7*  --   --   --   --   --   --   HGB 10.6*  < > 8.3* 8.7* 8.5* 8.3* 8.5*  HCT 31.6*  < > 26.4* 26.5* 26.4* 25.9* 24.9*  MCV 78.6  < > 80.2 79.8 81.7 79.0 78.5  PLT 453*  < > 436* 450* 479* 438* 445*  < > = values in this interval not displayed. Basic Metabolic Panel:  Recent Labs Lab 10/29/15 10/29/15 0311 10/29/15 1614 10/30/15 0603 10/31/15 0349  NA 135 135 132* 131* 132*  K 3.9 3.5 3.6 3.5 3.3*  CL 107 105 104 101 105  CO2 15* 15* 13* 13* 14*  GLUCOSE 107* 108* 98 26* 101*  BUN 38* 38* 40* 44* 45*  CREATININE 3.76* 3.88* 4.29* 4.57* 4.17*  CALCIUM 7.5* 7.2* 7.4* 7.4* 7.1*  PHOS  --   --  6.0* 6.6* 6.6*   GFR: Estimated Creatinine Clearance: 22 mL/min (by C-G formula based on Cr of 4.17). Liver Function Tests:  Recent Labs Lab 10/29/15 0311 10/29/15 1614 10/30/15 0603 10/31/15 0349  AST 30  --   --   --   ALT 8*  --   --   --   ALKPHOS 521*  --   --   --   BILITOT 1.0  --   --   --   PROT 5.2*  --   --   --   ALBUMIN 1.0* <1.0* 1.0* <1.0*   No results for input(s): LIPASE, AMYLASE in the last 168 hours. No results for input(s): AMMONIA in the last 168 hours. Coagulation Profile: No results for input(s): INR, PROTIME in the last 168 hours. Cardiac Enzymes: No results for input(s): CKTOTAL, CKMB, CKMBINDEX, TROPONINI in the last 168 hours. BNP (last 3 results) No results for input(s): PROBNP in the last 8760 hours. HbA1C: No results for input(s): HGBA1C in the last 72 hours. CBG:  Recent Labs Lab 10/30/15 0751 10/30/15 1118 10/30/15 1728 10/30/15 2206 10/31/15 0846  GLUCAP 75 111* 124* 99 160*   Lipid Profile: No results for input(s): CHOL, HDL, LDLCALC, TRIG, CHOLHDL, LDLDIRECT in the last 72  hours. Thyroid Function Tests: No results for input(s): TSH, T4TOTAL, FREET4, T3FREE, THYROIDAB in the last 72 hours. Anemia Panel:  Recent Labs  10/30/15 0913  VITAMINB12 3293*   Urine analysis:    Component Value Date/Time   COLORURINE YELLOW 10/29/2015 1020   APPEARANCEUR TURBID* 10/29/2015 1020   LABSPEC 1.026 10/29/2015 1020   PHURINE 5.0 10/29/2015 1020   GLUCOSEU NEGATIVE 10/29/2015 1020   HGBUR MODERATE* 10/29/2015 1020   BILIRUBINUR NEGATIVE 10/29/2015 1020   KETONESUR NEGATIVE 10/29/2015 1020  PROTEINUR 100* 10/29/2015 1020   UROBILINOGEN 1.0 08/20/2014 1042   NITRITE NEGATIVE 10/29/2015 1020   LEUKOCYTESUR MODERATE* 10/29/2015 1020   Recent Results (from the past 240 hour(s))  Culture, blood (Routine X 2) w Reflex to ID Panel     Status: None   Collection Time: 10/08/2015  7:52 PM  Result Value Ref Range Status   Specimen Description BLOOD LEFT ANTECUBITAL  Final   Special Requests BOTTLES DRAWN AEROBIC ONLY 10CC  Final   Culture NO GROWTH 5 DAYS  Final   Report Status 10/31/2015 FINAL  Final  Culture, blood (Routine X 2) w Reflex to ID Panel     Status: None   Collection Time: 10/18/2015  8:00 PM  Result Value Ref Range Status   Specimen Description BLOOD LEFT FOREARM  Final   Special Requests BOTTLES DRAWN AEROBIC ONLY 9CC  Final   Culture NO GROWTH 5 DAYS  Final   Report Status 10/31/2015 FINAL  Final  Surgical pcr screen     Status: None   Collection Time: 10/19/2015  8:27 AM  Result Value Ref Range Status   MRSA, PCR NEGATIVE NEGATIVE Final   Staphylococcus aureus NEGATIVE NEGATIVE Final    Comment:        The Xpert SA Assay (FDA approved for NASAL specimens in patients over 60 years of age), is one component of a comprehensive surveillance program.  Test performance has been validated by [facility] for patients greater than or equal to 80 year old. It is not intended to diagnose infection nor to guide or monitor treatment.   Anaerobic culture      Status: None (Preliminary result)   Collection Time: 11/05/2015 11:27 AM  Result Value Ref Range Status   Specimen Description ABSCESS BUTTOCKS LEFT  Final   Special Requests NONE  Final   Gram Stain   Final    MODERATE WBC PRESENT,BOTH PMN AND MONONUCLEAR NO SQUAMOUS EPITHELIAL CELLS SEEN FEW GRAM POSITIVE COCCI IN PAIRS Performed at [facility]    Culture   Final    NO ANAEROBES ISOLATED; CULTURE IN PROGRESS FOR 5 DAYS Performed at [facility]    Report Status PENDING  Incomplete  Culture, routine-abscess     Status: None   Collection Time: 10/14/2015 11:27 AM  Result Value Ref Range Status   Specimen Description ABSCESS BUTTOCKS LEFT  Final   Special Requests NONE  Final   Gram Stain   Final    MODERATE WBC PRESENT,BOTH PMN AND MONONUCLEAR NO SQUAMOUS EPITHELIAL CELLS SEEN FEW GRAM POSITIVE COCCI IN PAIRS Performed at [facility]    Culture   Final    MODERATE GROUP B STREP(S.AGALACTIAE)ISOLATED Note: Beta hemolytic streptococci are predictably susceptible to penicillin and other beta lactams. Susceptibility testing not routinely performed. Performed at [facility]    Report Status 10/30/2015 FINAL  Final  C difficile quick scan w PCR reflex     Status: None   Collection Time: 10/29/15  4:34 PM  Result Value Ref Range Status   C Diff antigen NEGATIVE NEGATIVE Final   C Diff toxin NEGATIVE NEGATIVE Final   C Diff interpretation Negative for toxigenic C. difficile  Final  Culture, Urine     Status: None   Collection Time: 10/29/15  7:36 PM  Result Value Ref Range Status   Specimen Description URINE, RANDOM  Final   Special Requests NONE  Final   Culture NO GROWTH 1 DAY  Final   Report  Status 10/30/2015 FINAL  Final  MRSA PCR Screening     Status: None   Collection Time: 10/30/15  7:24 PM  Result Value Ref Range Status   MRSA by PCR NEGATIVE NEGATIVE Final    Comment:        The GeneXpert MRSA Assay (FDA approved for NASAL  specimens only), is one component of a comprehensive MRSA colonization surveillance program. It is not intended to diagnose MRSA infection nor to guide or monitor treatment for MRSA infections.       Anti-infectives    Start     Dose/Rate Route Frequency Ordered Stop   10/29/15 2000  metroNIDAZOLE (FLAGYL) IVPB 500 mg  Status:  Discontinued     500 mg 100 mL/hr over 60 Minutes Intravenous Every 8 hours 10/29/15 1852 10/30/15 1312   10/29/15 0800  vancomycin (VANCOCIN) IVPB 750 mg/150 ml premix  Status:  Discontinued     750 mg 150 mL/hr over 60 Minutes Intravenous Every 24 hours 10/28/15 1058 10/31/15 0750   11/05/15 0800  vancomycin (VANCOCIN) 500 mg in sodium chloride 0.9 % 100 mL IVPB  Status:  Discontinued     500 mg 100 mL/hr over 60 Minutes Intravenous Every 12 hours 10/21/2015 1931 10/28/15 1058   11-05-2015 0200  piperacillin-tazobactam (ZOSYN) IVPB 3.375 g     3.375 g 12.5 mL/hr over 240 Minutes Intravenous Every 8 hours 10/17/2015 1938     10/18/2015 2000  piperacillin-tazobactam (ZOSYN) IVPB 3.375 g     3.375 g 100 mL/hr over 30 Minutes Intravenous  Once 10/20/2015 1903 10/16/2015 2117   10/12/2015 2000  vancomycin (VANCOCIN) IVPB 1000 mg/200 mL premix     1,000 mg 200 mL/hr over 60 Minutes Intravenous  Once 10/21/2015 1903 10/13/2015 2147       Radiology Studies: Ct Abdomen Pelvis Wo Contrast  10/29/2015  CLINICAL DATA:  Inflammatory bowel disease. Left gluteal abscess. Prominent leukocytosis. Renal failure. Inpatient. EXAM: CT ABDOMEN AND PELVIS WITHOUT CONTRAST TECHNIQUE: Multidetector CT imaging of the abdomen and pelvis was performed following the standard protocol without IV contrast. COMPARISON:  No prior CT abdomen/ pelvis. 10/28/2015 renal sonogram. FINDINGS: Examination is limited by streak artifact from the patient's upper extremities. Lower chest: Small bilateral pleural effusions with associated compressive atelectasis in the dependent lower lobes. Small pericardial  effusion/ thickening. Hepatobiliary: Normal liver with no liver mass. Normal gallbladder with no radiopaque cholelithiasis. No biliary ductal dilatation. Pancreas: Normal, with no mass or duct dilation. Spleen: Normal size. No mass. Adrenals/Urinary Tract: Normal adrenals. No hydronephrosis. No renal stones. No contour deforming renal mass. Urinary bladder is completely collapsed by indwelling Foley catheter and not well evaluated. Gas in the nondependent bladder lumen is expected given instrumentation. Stomach/Bowel: Grossly normal stomach. There is an approximately 10 cm length segment of mid to distal small bowel in the anterior lower abdomen (series 2/image 77) demonstrating mild-to-moderate circumferential bowel wall thickening. Normal caliber small bowel. Oral contrast reaches the distal rectum. Normal appendix. Normal large bowel with no diverticulosis, large bowel wall thickening or pericolonic fat stranding. Vascular/Lymphatic: Atherosclerotic nonaneurysmal abdominal aorta. Mild bilateral inguinal lymphadenopathy, largest 1.6 cm on the right (series 2/ image 99) and 1.6 cm on the left (series 2/ image 101. No additional pathologically enlarged lymph nodes in the abdomen or pelvis. Reproductive: Top-normal size prostate. Other: No pneumoperitoneum. Small volume abdominopelvic ascites. Musculoskeletal: No aggressive appearing focal osseous lesions. There are multiple small erosions along the sacroiliac joints bilaterally, in keeping with sacroiliitis. Symmetric mild gynecomastia. Anasarca. There  is superficial subcutaneous fat stranding in the bilateral lower gluteal cleft surrounding a superficial surgical drain, with no focal drainable superficial or intra-abdominal fluid collections. IMPRESSION: 1. Prominent superficial subcutaneous fat stranding in the bilateral lower gluteal cleft surrounding a superficial surgical drain, with no focal drainable superficial or intra-abdominal fluid collections. 2.  Approximately 10 cm in length small bowel loop in the mid to distal small bowel with circumferential small bowel wall thickening, suggesting active enteritis. Symmetric erosive sacroiliitis. Normal large bowel. This constellation of findings is most suggestive of Crohn disease (not ulcerative colitis). 3. Third-spacing, including small bilateral pleural effusions, small pericardial effusion, small volume ascites and anasarca. 4. Mild bilateral inguinal lymphadenopathy, nonspecific. Electronically Signed   By: Delbert Phenix M.D.   On: 10/29/2015 15:48   Dg Chest 2 View  10/29/2015  CLINICAL DATA:  Dyspnea EXAM: CHEST  2 VIEW COMPARISON:  08/29/2015 chest radiograph. FINDINGS: Low lung volumes. Stable cardiomediastinal silhouette with normal heart size. No pneumothorax. Small bilateral pleural effusions. No pulmonary edema. Patchy opacity in both lower lobes. IMPRESSION: 1. Small bilateral pleural effusions. 2. Patchy opacity in both lower lobes, favor atelectasis. Electronically Signed   By: Delbert Phenix M.D.   On: 10/29/2015 19:37   Mr Lumbar Spine Wo Contrast  10/31/2015  CLINICAL DATA:  Leukocytosis and fever with perirectal abscess status post irrigation and drainage x2. Lower extremity weakness with difficulty ambulating. History of chronic kidney disease and diabetes. EXAM: MRI LUMBAR SPINE WITHOUT CONTRAST TECHNIQUE: Multiplanar, multisequence MR imaging of the lumbar spine was performed. No intravenous contrast was administered. COMPARISON:  Abdominal pelvic CT 10/29/2015 FINDINGS: Segmentation: Conventional anatomy assumed, with the last open disc space designated L5-S1. Alignment:  Normal. Vertebrae: There is generalized decreased T1 and T2 marrow signal throughout the visualized bones which is attributed to chronic kidney disease. No acute osseous findings. No evidence of pars defect or discitis. Small erosions along the sacroiliac joints are likely related to the patient's renal disease and secondary  hyperparathyroidism. No evidence of sacroiliac joint effusion to suggest infection. Conus medullaris: Extends to the L1 level and appears normal. No evidence of epidural fluid collection. Paraspinal and other soft tissues: There is extensive edema throughout the paraspinal soft tissues, subcutaneous fat and retroperitoneum. No focal fluid collections are seen to suggest infection. There is free pelvic fluid, similar to recent CT. Disc levels: All of the lumbar discs are well hydrated with maintained height. No evidence of disc herniation, spinal stenosis or nerve root encroachment. No significant facet arthropathy. IMPRESSION: 1. No evidence of spinal infection. Specifically, no evidence of discitis, facet joint effusion or epidural abscess. 2. No disc herniation, spinal stenosis or nerve root encroachment. 3. Extensive soft tissue edema throughout the subcutaneous, retroperitoneal and peritoneal soft tissues, consistent with anasarca, similar to recent CT. 4. Marrow and sacroiliac changes attributed to underlying renal disease. Electronically Signed   By: Carey Bullocks M.D.   On: 10/31/2015 11:30        Scheduled Meds: . cholecalciferol  2,000 Units Oral Daily  . docusate sodium  100 mg Oral BID  . heparin  5,000 Units Subcutaneous Q8H  . insulin aspart  0-9 Units Subcutaneous TID WC  . insulin glargine  15 Units Subcutaneous QHS  . piperacillin-tazobactam (ZOSYN)  IV  3.375 g Intravenous Q8H  . thiamine IV  100 mg Intravenous Daily   Continuous Infusions: . dextrose    .  sodium bicarbonate 150 mEq in sterile water 1000 mL infusion  LOS: 5 days    Time spent: 25 min    Breyonna Nault Juanetta Gosling, DO [facility] Pager [phone]  If 7PM-7AM, please contact night-coverage [email or web address] Password [facility] 10/31/2015, 11:54 AM

## 2015-10-31 NOTE — Progress Notes (Addendum)
ANTIBIOTIC CONSULT NOTE - Follow Up  Pharmacy Consult for Vancomycin and Zosyn Indication: cellulitis/gluteal abscess  Allergies  Allergen Reactions  . Bee Venom Swelling    Throat involvement    Patient Measurements: Height:  (170.2 cm) Weight: 148 lb (67.132 kg) IBW/kg (Calculated) : 66.1  Vital Signs: Temp: 98.9 F (37.2 C) (04/25 0000) Temp Source: Oral (04/25 0000) BP: 133/73 mmHg (04/25 0600) Pulse Rate: 100 (04/25 0600)  Labs:  Recent Labs  10/29/15 0311 10/29/15 1614 10/29/15 1634 10/30/15 0603 10/30/15 0913 10/31/15 0349  WBC 46.0*  --   --   --  49.9* 53.3*  HGB 8.5*  --   --   --  8.3* 8.5*  PLT 479*  --   --   --  438* 445*  LABCREA  --   --  151.02  --   --   --   CREATININE 3.88* 4.29*  --  4.57*  --  4.17*   Estimated Creatinine Clearance: 22 mL/min (by C-G formula based on Cr of 4.17).    Medical History: Past Medical History  Diagnosis Date  . Diabetes mellitus without complication (HCC)   . Bell's palsy 08/2014  . CKD (chronic kidney disease)    Assessment:  40 yr old male seen in ED on 10/22/15 with intergluteal infection with small abcess. I&D, treated with Clindamycin as outpatient. Pt re-admitted with continued infection with unclear source. Pt has AKI SCr 4, eCrCl 20-25 ml/min, poor uop.    Vanc 4/20 >>  Zosyn 4/20 >>  Flagyl 423 >> 4/24  4/20 blood x 2 -ngtd  4/20 HIV - neg  4/21 Wound>> Group B strep 4/23 cdiff: neg   VR: 33  Goal of Therapy:  Vancomycin goal 10-15 mcg/ml Resolution of infection  Plan:  -Hold vancomycin until level ~15 -Continue zosyn 3.375 g IV q8h -Recommend discontinuing vancomycin and deescalating zosyn to ampicillin or Unasyn -Watch renal fx -F/u MRI today   Agapito Games, PharmD, BCPS Clinical Pharmacist 10/31/2015 8:32 AM   Addendum -Dc vanc, zosyn -Start Unasyn 1.5 g IV q12h  Baldemar Friday  10/31/2015 12:15 PM

## 2015-10-31 NOTE — Progress Notes (Signed)
Pt currently has not demonstrated tolerance for more intense therapies that would be required of an inpt rehab admission or to receive [employer] authorization. Please order OT eval. I will continue to follow. [phone]

## 2015-10-31 NOTE — NC FL2 (Signed)
[place] MEDICAID FL2 LEVEL OF CARE SCREENING TOOL     IDENTIFICATION  Patient Name: Shane Dougherty Birthdate: Jun 30, 1976 Sex: male Admission Date (Current Location): 10/27/2015  [facility] and [place] Number:  Producer, television/film/video and Address:         Provider Number: [id number]  Attending Physician Name and Address:  Harriet Art, DO  Relative Name and Phone Number:  Harriett Sine [phone]    Current Level of Care: Hospital Recommended Level of Care: Skilled Nursing Facility Prior Approval Number:    Date Approved/Denied:   PASRR Number: [id number]  Discharge Plan: SNF    Current Diagnoses: Patient Active Problem List   Diagnosis Date Noted  . Pressure ulcer 10/31/2015  . AKI (acute kidney injury) (HCC) 10/29/2015  . Peri-rectal abscess 10/07/2015  . CKD (chronic kidney disease) 10/18/2015  . Leukocytosis 10/18/2015  . Loss of weight 05/11/2015  . Pain in joint, lower leg 04/26/2015  . Diabetic neuropathy, type II diabetes mellitus (HCC) 04/26/2015  . Erectile dysfunction 04/12/2015  . Essential hypertension 04/06/2015  . Facial cellulitis 04/03/2015  . Hyperglycemia due to type 2 diabetes mellitus (HCC)   . Hyponatremia   . Absolute anemia     Orientation RESPIRATION BLADDER Height & Weight     Self, Time, Situation, Place  Normal Continent, Indwelling catheter (Urinary catheter) Weight: 148 lb (67.132 kg) Height:  5\' 7"  (170.2 cm)  BEHAVIORAL SYMPTOMS/MOOD NEUROLOGICAL BOWEL NUTRITION STATUS      Incontinent Diet (Please see DC summary)  AMBULATORY STATUS COMMUNICATION OF NEEDS Skin   Limited Assist Verbally PU Stage and Appropriate Care (PU stage II on sacrum; closed incision on)                       Personal Care Assistance Level of Assistance  Bathing, Feeding, Dressing Bathing Assistance: Maximum assistance Feeding assistance: Independent Dressing Assistance: Limited assistance     Functional Limitations Info             SPECIAL  CARE FACTORS FREQUENCY  PT (By licensed PT)     PT Frequency: min 3x/week              Contractures      Additional Factors Info  Code Status, Allergies, Insulin Sliding Scale Code Status Info: Full Allergies Info: Bee Venom   Insulin Sliding Scale Info: insulin aspart (novoLOG) injection 0-9 Units;insulin glargine (LANTUS) injection 15 Units       Current Medications (10/31/2015):  This is the current hospital active medication list Current Facility-Administered Medications  Medication Dose Route Frequency Provider Last Rate Last Dose  . ampicillin-sulbactam (UNASYN) 1.5 g in sodium chloride 0.9 % 50 mL IVPB  1.5 g Intravenous Q12H Darl Householder Masters, RPH   1.5 g at 10/31/15 1357  . cholecalciferol (VITAMIN D) tablet 2,000 Units  2,000 Units Oral Daily Teryl Art, DO   2,000 Units at 10/31/15 1357  . dextrose 5 % solution   Intravenous Continuous Terrial Rhodes, MD 25 mL/hr at 10/31/15 1600    . docusate sodium (COLACE) capsule 100 mg  100 mg Oral BID Shamel Art, DO   100 mg at 10/31/15 0913  . heparin injection 5,000 Units  5,000 Units Subcutaneous Q8H Jonthan Art, DO   5,000 Units at 10/31/15 1357  . HYDROcodone-acetaminophen (NORCO/VICODIN) 5-325 MG per tablet 1-2 tablet  1-2 tablet Oral Q4H PRN Gerrald Art, DO   2 tablet at 10/31/15 1657  . insulin aspart (  novoLOG) injection 0-9 Units  0-9 Units Subcutaneous TID WC Branson Art, DO   2 Units at 10/31/15 0916  . insulin glargine (LANTUS) injection 15 Units  15 Units Subcutaneous QHS Sladen Art, DO   15 Units at 10/30/15 2234  . ondansetron (ZOFRAN) tablet 4 mg  4 mg Oral Q6H PRN Elijah Art, DO       Or  . ondansetron (ZOFRAN) injection 4 mg  4 mg Intravenous Q6H PRN Corrion Art, DO   4 mg at 10/28/15 0104  . senna-docusate (Senokot-S) tablet 1 tablet  1 tablet Oral QHS PRN Rodolfo Gaster Art, DO   1 tablet at 10/17/2015 2047  . sodium bicarbonate 150 mEq in sterile water 1,000 mL infusion   Intravenous  Continuous Belinda Fisher Stone, RPH   0  at 10/31/15 1553  . thiamine (B-1) injection 100 mg  100 mg Intravenous Daily Maalik Art, DO   100 mg at 10/31/15 6503     Discharge Medications: Please see discharge summary for a list of discharge medications.  Relevant Imaging Results:  Relevant Lab Results:   Additional Information SSN: [id number] [place], [place]

## 2015-10-31 NOTE — Clinical Social Work Note (Signed)
Clinical Social Work Assessment  Patient Details  Name: Shane Dougherty MRN: [id number] Date of Birth: 06/28/1976  Date of referral:  10/31/15               Reason for consult:  Facility Placement                Permission sought to share information with:  Case Manager, Magazine features editor, Family Supports Permission granted to share information::  Yes, Verbal Permission Granted  Name::      Shane Dougherty [phone]))  Agency::     Relationship::     Contact Information:     Housing/Transportation Living arrangements for the past 2 months:  Single Family Home Source of Information:  Patient Patient Interpreter Needed:  None Criminal Activity/Legal Involvement Pertinent to Current Situation/Hospitalization:  No - Comment as needed Significant Relationships:  None Lives with:  Self Do you feel safe going back to the place where you live?  Yes Need for family participation in patient care:  No (Coment)  Care giving concerns:  If CIR does not fall through would patient consider SNF.   Social Worker assessment / plan:  BSW intern enters patient room, patient was alert and oriented. BSW intern asked patient if CIR does not fall through will he consider a SNF and patient stated yes he is agreeable to SNF if CIR does not accept. BSW intern notified patient that due to his insurance he might have to pay a co-pay. Patient is fine with that but may need some assistance when the time come depending on expensive it may be.  Employment status:  Disabled (Comment on whether or not currently receiving Disability) Insurance information:  Managed Medicare Administrator) PT Recommendations:  Inpatient Rehab Consult Information / Referral to community resources:  Acute Rehab  Patient/Family's Response to care:  Patient has a good response to care.   Patient/Family's Understanding of and Emotional Response to Diagnosis, Current Treatment, and Prognosis:  Patient has a good insight on current  condition and treatment plan.   Emotional Assessment Appearance:  Appears stated age Attitude/Demeanor/Rapport:   (welcoming) Affect (typically observed):  Accepting (sleepy) Orientation:  Oriented to Self, Oriented to Place, Oriented to  Time, Oriented to Situation Alcohol / Substance use:  Alcohol Use, Tobacco Use (passive smoker; drinks alcohol ) Psych involvement (Current and /or in the community):  No (Comment)  Discharge Needs  Concerns to be addressed:  No discharge needs identified Readmission within the last 30 days:  No Current discharge risk:  None Barriers to Discharge:  No Barriers Identified  Catheryn Bacon  BSW intern  [phone]

## 2015-10-31 NOTE — Clinical Social Work Placement (Signed)
   CLINICAL SOCIAL WORK PLACEMENT  NOTE  Date:  10/31/2015  Patient Details  Name: Shane Dougherty MRN: [id number] Date of Birth: [birth year]  Clinical Social Work is seeking post-discharge placement for this patient at the Skilled  Nursing Facility level of care (*CSW will initial, date and re-position this form in  chart as items are completed):      Patient/family provided with [facility] Clinical Social Work Department's list of facilities offering this level of care within the geographic area requested by the patient (or if unable, by the patient's family).      Patient/family informed of their freedom to choose among providers that offer the needed level of care, that participate in Medicare, Medicaid or managed care program needed by the patient, have an available bed and are willing to accept the patient.      Patient/family informed of [facility]'s ownership interest in [facility] and [facility], as well as of the fact that they are under no obligation to receive care at these facilities.  PASRR submitted to EDS on 10/31/15     PASRR number received on 10/31/15     Existing PASRR number confirmed on       FL2 transmitted to all facilities in geographic area requested by pt/family on 10/31/15     FL2 transmitted to all facilities within larger geographic area on       Patient informed that his/her managed care company has contracts with or will negotiate with certain facilities, including the following:            Patient/family informed of bed offers received.  Patient chooses bed at       Physician recommends and patient chooses bed at      Patient to be transferred to   on  .  Patient to be transferred to facility by       Patient family notified on   of transfer.  Name of family member notified:        PHYSICIAN Please sign FL2     Additional Comment:    _______________________________________________ Mearl Latin, LCSWA 10/31/2015, 5:53  PM

## 2015-10-31 NOTE — Progress Notes (Signed)
   10/31/15 1200  Clinical Encounter Type  Visited With Patient;Health care provider  Visit Type Initial;Psychological support;Spiritual support;Social support  Referral From Nurse;Care management  Consult/Referral To Chaplain;Faith community  Spiritual Encounters  Spiritual Needs Emotional  Stress Factors  Patient Stress Factors Exhausted;Family relationships;Health changes;Lack of knowledge;Loss of control;Major life changes  Family Stress Factors None identified   Chaplain visited with Shane Dougherty to give Emotional and Spiritual Care via prayer. Chaplain will continue to see Pt.

## 2015-10-31 NOTE — Progress Notes (Signed)
4 Days Post-Op  Subjective: Sleeping comfortably denies pain  Can't move lower extremities  Denies abdominal  buttock or perineal pain  Objective: Vital signs in last 24 hours: Temp:  [98.5 F (36.9 C)-98.9 F (37.2 C)] 98.9 F (37.2 C) (04/25 0000) Pulse Rate:  [80-103] 100 (04/25 0600) Resp:  [10-21] 20 (04/25 0600) BP: (87-147)/(59-84) 133/73 mmHg (04/25 0600) SpO2:  [96 %-100 %] 96 % (04/25 0600) Last BM Date: 10/30/15  Intake/Output from previous day: 04/24 0701 - 04/25 0700 In: 1026.3 [P.O.:480; I.V.:546.3] Out: 400 [Urine:400] Intake/Output this shift:    GI: soft, non-tender; bowel sounds normal; no masses,  no organomegaly Male genitalia: penile and scrotal edema stable  no fluctuance or crepitence  Extremities: can not move lower extremeties prprioception off  sensation to touch intact  Incision/Wound:wound with drain in place  Area of ulceration adjacent stable  No SQ air  No fluctuance or erythema  Perineum is non tender   Lab Results:   Recent Labs  10/30/15 0913 10/31/15 0349  WBC 49.9* 53.3*  HGB 8.3* 8.5*  HCT 25.9* 24.9*  PLT 438* 445*   BMET  Recent Labs  10/30/15 0603 10/31/15 0349  NA 131* 132*  K 3.5 3.3*  CL 101 105  CO2 13* 14*  GLUCOSE 26* 101*  BUN 44* 45*  CREATININE 4.57* 4.17*  CALCIUM 7.4* 7.1*   PT/INR No results for input(s): LABPROT, INR in the last 72 hours. ABG No results for input(s): PHART, HCO3 in the last 72 hours.  Invalid input(s): PCO2, PO2  Studies/Results: Ct Abdomen Pelvis Wo Contrast  10/29/2015  CLINICAL DATA:  Inflammatory bowel disease. Left gluteal abscess. Prominent leukocytosis. Renal failure. Inpatient. EXAM: CT ABDOMEN AND PELVIS WITHOUT CONTRAST TECHNIQUE: Multidetector CT imaging of the abdomen and pelvis was performed following the standard protocol without IV contrast. COMPARISON:  No prior CT abdomen/ pelvis. 10/28/2015 renal sonogram. FINDINGS: Examination is limited by streak artifact from the  patient's upper extremities. Lower chest: Small bilateral pleural effusions with associated compressive atelectasis in the dependent lower lobes. Small pericardial effusion/ thickening. Hepatobiliary: Normal liver with no liver mass. Normal gallbladder with no radiopaque cholelithiasis. No biliary ductal dilatation. Pancreas: Normal, with no mass or duct dilation. Spleen: Normal size. No mass. Adrenals/Urinary Tract: Normal adrenals. No hydronephrosis. No renal stones. No contour deforming renal mass. Urinary bladder is completely collapsed by indwelling Foley catheter and not well evaluated. Gas in the nondependent bladder lumen is expected given instrumentation. Stomach/Bowel: Grossly normal stomach. There is an approximately 10 cm length segment of mid to distal small bowel in the anterior lower abdomen (series 2/image 77) demonstrating mild-to-moderate circumferential bowel wall thickening. Normal caliber small bowel. Oral contrast reaches the distal rectum. Normal appendix. Normal large bowel with no diverticulosis, large bowel wall thickening or pericolonic fat stranding. Vascular/Lymphatic: Atherosclerotic nonaneurysmal abdominal aorta. Mild bilateral inguinal lymphadenopathy, largest 1.6 cm on the right (series 2/ image 99) and 1.6 cm on the left (series 2/ image 101. No additional pathologically enlarged lymph nodes in the abdomen or pelvis. Reproductive: Top-normal size prostate. Other: No pneumoperitoneum. Small volume abdominopelvic ascites. Musculoskeletal: No aggressive appearing focal osseous lesions. There are multiple small erosions along the sacroiliac joints bilaterally, in keeping with sacroiliitis. Symmetric mild gynecomastia. Anasarca. There is superficial subcutaneous fat stranding in the bilateral lower gluteal cleft surrounding a superficial surgical drain, with no focal drainable superficial or intra-abdominal fluid collections. IMPRESSION: 1. Prominent superficial subcutaneous fat  stranding in the bilateral lower gluteal cleft surrounding a  superficial surgical drain, with no focal drainable superficial or intra-abdominal fluid collections. 2. Approximately 10 cm in length small bowel loop in the mid to distal small bowel with circumferential small bowel wall thickening, suggesting active enteritis. Symmetric erosive sacroiliitis. Normal large bowel. This constellation of findings is most suggestive of Crohn disease (not ulcerative colitis). 3. Third-spacing, including small bilateral pleural effusions, small pericardial effusion, small volume ascites and anasarca. 4. Mild bilateral inguinal lymphadenopathy, nonspecific. Electronically Signed   By: Delbert Phenix M.D.   On: 10/29/2015 15:48   Dg Chest 2 View  10/29/2015  CLINICAL DATA:  Dyspnea EXAM: CHEST  2 VIEW COMPARISON:  08/29/2015 chest radiograph. FINDINGS: Low lung volumes. Stable cardiomediastinal silhouette with normal heart size. No pneumothorax. Small bilateral pleural effusions. No pulmonary edema. Patchy opacity in both lower lobes. IMPRESSION: 1. Small bilateral pleural effusions. 2. Patchy opacity in both lower lobes, favor atelectasis. Electronically Signed   By: Delbert Phenix M.D.   On: 10/29/2015 19:37    Anti-infectives: Anti-infectives    Start     Dose/Rate Route Frequency Ordered Stop   10/29/15 2000  metroNIDAZOLE (FLAGYL) IVPB 500 mg  Status:  Discontinued     500 mg 100 mL/hr over 60 Minutes Intravenous Every 8 hours 10/29/15 1852 10/30/15 1312   10/29/15 0800  vancomycin (VANCOCIN) IVPB 750 mg/150 ml premix     750 mg 150 mL/hr over 60 Minutes Intravenous Every 24 hours 10/28/15 1058     11/04/2015 0800  vancomycin (VANCOCIN) 500 mg in sodium chloride 0.9 % 100 mL IVPB  Status:  Discontinued     500 mg 100 mL/hr over 60 Minutes Intravenous Every 12 hours 10/07/2015 1931 10/28/15 1058   11/01/2015 0200  piperacillin-tazobactam (ZOSYN) IVPB 3.375 g     3.375 g 12.5 mL/hr over 240 Minutes Intravenous Every  8 hours 10/16/2015 1938     10/29/2015 2000  piperacillin-tazobactam (ZOSYN) IVPB 3.375 g     3.375 g 100 mL/hr over 30 Minutes Intravenous  Once 10/24/2015 1903 10/07/2015 2117   10/18/2015 2000  vancomycin (VANCOCIN) IVPB 1000 mg/200 mL premix     1,000 mg 200 mL/hr over 60 Minutes Intravenous  Once 10/16/2015 1903 10/28/2015 2147      Assessment/Plan: s/p Procedure(s): INCISION AND DRAINAGE ABSCESS/GLUTEAL (Left) Patient Active Problem List   Diagnosis Date Noted  . AKI (acute kidney injury) (HCC) 10/29/2015  . Peri-rectal abscess 10/22/2015  . CKD (chronic kidney disease) 10/25/2015  . Leukocytosis 10/12/2015  . Loss of weight 05/11/2015  . Pain in joint, lower leg 04/26/2015  . Diabetic neuropathy, type II diabetes mellitus (HCC) 04/26/2015  . Erectile dysfunction 04/12/2015  . Essential hypertension 04/06/2015  . Facial cellulitis 04/03/2015  . Hyperglycemia due to type 2 diabetes mellitus (HCC)   . Hyponatremia   . Absolute anemia     Pt denies abdominal pain and wants to eat Denies buttock or scrotal pain Exam is relatively benign Not sure what WBC elevation is from but abdomen and wound are stable and there is no signs of necrotizing fasciitis or peritonitis on exam Pt lower extremity paralysis more so today on exam and proprioception on exam is not normal  Going for MRI today Ok to advance diet for now since no plans for OR at this point   Strep cultured from buttock wound susceptible to pcn-   On zosyn  Continues to be a mystery at  This point with rising WBC auto immune ?   LOS: 5 days  Maddix Kliewer A. 10/31/2015

## 2015-10-31 NOTE — Progress Notes (Signed)
Physical Therapy Treatment Patient Details Name: Shane Dougherty MRN: [id number] DOB: Mar 06, 1976 Today's Date: 10/31/2015    History of Present Illness Pt is a 40 y/o male who presents s/p I&D of L gluteal abscess. Pt had been seen in ED a few days prior to admission and also had an I&D done at that time.PMH significant for DM.     PT Comments    Pt admitted with above diagnosis. Pt currently with functional limitations due to balance and endurance deficits as well as strength issues.  Pt unable to perform more than exercise today.  Will attempt OOB next visit.    Pt will benefit from skilled PT to increase their independence and safety with mobility to allow discharge to the venue listed below.    Follow Up Recommendations  CIR;Supervision/Assistance - 24 hour     Equipment Recommendations  Rolling walker with 5" wheels;3in1 (PT)    Recommendations for Other Services Rehab consult     Precautions / Restrictions Precautions Precautions: Fall Restrictions Weight Bearing Restrictions: No    Mobility  Bed Mobility               General bed mobility comments: pt refused to do more than exercise  Transfers                    Ambulation/Gait                 Stairs            Wheelchair Mobility    Modified Rankin (Stroke Patients Only)       Balance                                    Cognition Arousal/Alertness: Awake/alert Behavior During Therapy: Flat affect Overall Cognitive Status: Within Functional Limits for tasks assessed                      Exercises General Exercises - Upper Extremity Shoulder Flexion: AROM;Both;10 reps;Supine;AAROM Shoulder ABduction: AROM;Both;10 reps;Supine Elbow Flexion: AROM;Both;10 reps;Supine General Exercises - Lower Extremity Ankle Circles/Pumps: AROM;Both;10 reps;Supine Quad Sets: AROM;Both;5 reps;Supine Heel Slides: Both;10 reps;Supine;AAROM Hip ABduction/ADduction:  Both;10 reps;Supine;AAROM Straight Leg Raises: AAROM;Both;10 reps;Supine    General Comments        Pertinent Vitals/Pain Pain Assessment: Faces Faces Pain Scale: Hurts little more Pain Location: hips Pain Descriptors / Indicators: Aching Pain Intervention(s): Limited activity within patient's tolerance;Monitored during session;Repositioned  VSS    Home Living                      Prior Function            PT Goals (current goals can now be found in the care plan section) Progress towards PT goals: Progressing toward goals    Frequency  Min 3X/week    PT Plan Current plan remains appropriate    Co-evaluation             End of Session   Activity Tolerance: Patient limited by fatigue Patient left: in bed;with call bell/phone within reach;with bed alarm set     Time: 5366-4403 PT Time Calculation (min) (ACUTE ONLY): 11 min  Charges:  $Therapeutic Exercise: 8-22 mins                    G Codes:      [place], [employer]  F 10/31/2015, 3:05 PM [place] [facility] Acute Rehabilitation [phone] (pager)

## 2015-10-31 NOTE — Progress Notes (Signed)
NIF -30 cmh20 and VC 1.0 L/min.  Good effort given.

## 2015-10-31 NOTE — Progress Notes (Signed)
Patient c/o chest pain- EKG and CE ordered EKG with rate of 92 no ST changes Await CE Marlin Canary DO

## 2015-10-31 NOTE — Consult Note (Addendum)
NEURO HOSPITALIST CONSULT NOTE   Requestig physician: Dr. Benjamine Mola   Reason for Consult: LE weakness   History obtained from:  Patient     HPI:                                                                                                                                          Shane Dougherty is an 40 y.o. male who presented to the hospital for I&D of left gluteal abscess. Patient und went surgery on Thursday. He states since the surgery he has been unable to move his legs. Prior to hospitalization he was able to walk with a cane. He feels his weakness has slowly become worse and now he is having difficulty lifting his arms and feels he was having difficulty coughing up phlegm. HE has since had a urinary catheter palced due to inability to urinated and urine retention. HE tells me after surgery he noted his feet felt like they were "in ICE" and now his hands feel that way. Denies any recent colds or vaccinations.   Current labs NA 132 Cr 4.17 BUN 45 K 3.3 WBC 53.3 He is afebrile.  He is on Unysin  Past Medical History  Diagnosis Date  . Diabetes mellitus without complication (HCC)   . Bell's palsy 08/2014  . CKD (chronic kidney disease)     Past Surgical History  Procedure Laterality Date  . Incise and drain abcess  Nov 16, 2015    GLUTEAL  . Incision and drainage abscess Left 2015-11-16    Procedure: INCISION AND DRAINAGE ABSCESS/GLUTEAL;  Surgeon: De Blanch Kinsinger, MD;  Location: [facility] OR;  Service: General;  Laterality: Left;    Family History  Problem Relation Age of Onset  . CAD Mother     Social History:  reports that he has been passively smoking.  He has never used smokeless tobacco. He reports that he drinks alcohol. He reports that he does not use illicit drugs.  Allergies  Allergen Reactions  . Bee Venom Swelling    Throat involvement    MEDICATIONS:                                                                                                                      Prior to Admission:  Prescriptions  prior to admission  Medication Sig Dispense Refill Last Dose  . acetaminophen (TYLENOL) 500 MG tablet Take 500-1,000 mg by mouth every 6 (six) hours as needed for mild pain or moderate pain.   Past Month at Unknown time  . clindamycin (CLEOCIN) 300 MG capsule Take 1 capsule (300 mg total) by mouth 4 (four) times daily. X 7 days 28 capsule 0 10/25/2015 at Unknown time  . HYDROcodone-acetaminophen (NORCO) 5-325 MG tablet Take 1-2 tablets by mouth every 4 (four) hours as needed. (Patient taking differently: Take 1-2 tablets by mouth every 4 (four) hours as needed for moderate pain. ) 20 tablet 0 Past Week at Unknown time  . insulin glargine (LANTUS) 100 UNIT/ML injection Inject 0.25 mLs (25 Units total) into the skin at bedtime. 10 mL 2 10/25/2015 at Unknown time  . insulin lispro (HUMALOG) 100 UNIT/ML injection Inject 0.1 mLs (10 Units total) into the skin 3 (three) times daily with meals. Your sliding scale with meals. 10 mL 2 10/25/2015 at Unknown time  . polyvinyl alcohol (LIQUIFILM TEARS) 1.4 % ophthalmic solution Place 3-4 drops into both eyes as needed for dry eyes.   Past Month at Unknown time   Scheduled: . ampicillin-sulbactam (UNASYN) IV  1.5 g Intravenous Q12H  . cholecalciferol  2,000 Units Oral Daily  . docusate sodium  100 mg Oral BID  . heparin  5,000 Units Subcutaneous Q8H  . insulin aspart  0-9 Units Subcutaneous TID WC  . insulin glargine  15 Units Subcutaneous QHS  . thiamine IV  100 mg Intravenous Daily     ROS:                                                                                                                                       History obtained from the patient  General ROS: negative for - chills, fatigue, fever, night sweats, weight gain or weight loss Psychological ROS: negative for - behavioral disorder, hallucinations, memory difficulties, mood swings or suicidal ideation Ophthalmic ROS:  negative for - blurry vision, double vision, eye pain or loss of vision ENT ROS: negative for - epistaxis, nasal discharge, oral lesions, sore throat, tinnitus or vertigo Allergy and Immunology ROS: negative for - hives or itchy/watery eyes Hematological and Lymphatic ROS: negative for - bleeding problems, bruising or swollen lymph nodes Endocrine ROS: negative for - galactorrhea, hair pattern changes, polydipsia/polyuria or temperature intolerance Respiratory ROS: negative for - cough, hemoptysis, shortness of breath or wheezing Cardiovascular ROS: negative for - chest pain, dyspnea on exertion, edema or irregular heartbeat Gastrointestinal ROS: negative for - abdominal pain, diarrhea, hematemesis, nausea/vomiting or stool incontinence Genito-Urinary ROS: negative for - dysuria, hematuria, incontinence or urinary frequency/urgency Musculoskeletal ROS: negative for - joint swelling or muscular weakness Neurological ROS: as noted in HPI Dermatological ROS: negative for rash and skin lesion changes   Blood pressure 111/61, pulse 83, temperature 97.7 F (36.5 C), temperature  source Oral, resp. rate 10, height 5\' 7"  (1.702 m), weight 67.132 kg (148 lb), SpO2 98 %.   Neurologic Examination:                                                                                                      HEENT-  Normocephalic, no lesions, without obvious abnormality.  Normal external eye and conjunctiva.  Normal TM's bilaterally.  Normal auditory canals and external ears. Normal external nose, mucus membranes and septum.  Normal pharynx. Cardiovascular- S1, S2 normal, pulses palpable throughout   Lungs- chest clear, no wheezing, rales, normal symmetric air entry Abdomen- normal findings: bowel sounds normal Extremities- no edema Lymph-no adenopathy palpable Musculoskeletal-no joint tenderness, deformity or swelling Skin-warm and dry, no hyperpigmentation, vitiligo, or suspicious lesions  Neurological  Examination Mental Status: Alert, oriented, thought content appropriate.  Speech fluent without evidence of aphasia.  Able to follow 3 step commands without difficulty. Cranial Nerves: II:  Visual fields grossly normal, pupils equal, round, reactive to light and accommodation III,IV, VI: ptosis not present, extra-ocular motions intact bilaterally V,VII: smile symmetric, facial light touch sensation normal bilaterally VIII: hearing normal bilaterally IX,X: uvula rises symmetrically XI: bilateral shoulder shrug XII: midline tongue extension Motor: Right : Upper extremity   4-/5    Left:     Upper extremity   4-/5  --bilateral LE shows minimal hip abduction and adduction, left knee extension 3/5 and flexion 3/5 with minimal minimal DF/PF. Right leg has no Knee extension or PF/DF.   Sensory: Pinprick and light touch intact but decreased in legs with no proprioception of feet and no vibratory in ankles or knee. Preserved vibration in hands and elbow. Decreased sensation extending to fingers now.  Deep Tendon Reflexes: 1+ and symmetric throughout UE and no KJ or AJ Plantars: Down going bilaterally Cerebellar: Dysmetric finger-to-nose due to weakness and unable to do heel-to-shin test Gait: not tested      Lab Results: Basic Metabolic Panel:  Recent Labs Lab 10/29/15 10/29/15 0311 10/29/15 1614 10/30/15 0603 10/31/15 0349  NA 135 135 132* 131* 132*  K 3.9 3.5 3.6 3.5 3.3*  CL 107 105 104 101 105  CO2 15* 15* 13* 13* 14*  GLUCOSE 107* 108* 98 26* 101*  BUN 38* 38* 40* 44* 45*  CREATININE 3.76* 3.88* 4.29* 4.57* 4.17*  CALCIUM 7.5* 7.2* 7.4* 7.4* 7.1*  PHOS  --   --  6.0* 6.6* 6.6*    Liver Function Tests:  Recent Labs Lab 10/29/15 0311 10/29/15 1614 10/30/15 0603 10/31/15 0349  AST 30  --   --   --   ALT 8*  --   --   --   ALKPHOS 521*  --   --   --   BILITOT 1.0  --   --   --   PROT 5.2*  --   --   --   ALBUMIN 1.0* <1.0* 1.0* <1.0*   No results for input(s):  LIPASE, AMYLASE in the last 168 hours. No results for input(s): AMMONIA in the last 168 hours.  CBC:  Recent Labs Lab 10/21/2015 1318  10/28/15 0552 10/29/15 10/29/15 0311 10/30/15 0913 10/31/15 0349  WBC 22.8*  < > 33.9* 42.7* 46.0* 49.9* 53.3*  NEUTROABS 20.7*  --   --   --   --   --   --   HGB 10.6*  < > 8.3* 8.7* 8.5* 8.3* 8.5*  HCT 31.6*  < > 26.4* 26.5* 26.4* 25.9* 24.9*  MCV 78.6  < > 80.2 79.8 81.7 79.0 78.5  PLT 453*  < > 436* 450* 479* 438* 445*  < > = values in this interval not displayed.  Cardiac Enzymes: No results for input(s): CKTOTAL, CKMB, CKMBINDEX, TROPONINI in the last 168 hours.  Lipid Panel: No results for input(s): CHOL, TRIG, HDL, CHOLHDL, VLDL, LDLCALC in the last 168 hours.  CBG:  Recent Labs Lab 10/30/15 1118 10/30/15 1728 10/30/15 2206 10/31/15 0846 10/31/15 1201  GLUCAP 111* 124* 99 160* 116*    Microbiology: Results for orders placed or performed during the hospital encounter of 10/15/2015  Culture, blood (Routine X 2) w Reflex to ID Panel     Status: None   Collection Time: 10/10/2015  7:52 PM  Result Value Ref Range Status   Specimen Description BLOOD LEFT ANTECUBITAL  Final   Special Requests BOTTLES DRAWN AEROBIC ONLY 10CC  Final   Culture NO GROWTH 5 DAYS  Final   Report Status 10/31/2015 FINAL  Final  Culture, blood (Routine X 2) w Reflex to ID Panel     Status: None   Collection Time: 10/09/2015  8:00 PM  Result Value Ref Range Status   Specimen Description BLOOD LEFT FOREARM  Final   Special Requests BOTTLES DRAWN AEROBIC ONLY 9CC  Final   Culture NO GROWTH 5 DAYS  Final   Report Status 10/31/2015 FINAL  Final  Surgical pcr screen     Status: None   Collection Time: 10/17/2015  8:27 AM  Result Value Ref Range Status   MRSA, PCR NEGATIVE NEGATIVE Final   Staphylococcus aureus NEGATIVE NEGATIVE Final    Comment:        The Xpert SA Assay (FDA approved for NASAL specimens in patients over 34 years of age), is one component of a  comprehensive surveillance program.  Test performance has been validated by [facility] for patients greater than or equal to 52 year old. It is not intended to diagnose infection nor to guide or monitor treatment.   Anaerobic culture     Status: None (Preliminary result)   Collection Time: 10/29/2015 11:27 AM  Result Value Ref Range Status   Specimen Description ABSCESS BUTTOCKS LEFT  Final   Special Requests NONE  Final   Gram Stain   Final    MODERATE WBC PRESENT,BOTH PMN AND MONONUCLEAR NO SQUAMOUS EPITHELIAL CELLS SEEN FEW GRAM POSITIVE COCCI IN PAIRS Performed at [facility]    Culture   Final    NO ANAEROBES ISOLATED; CULTURE IN PROGRESS FOR 5 DAYS Performed at [facility]    Report Status PENDING  Incomplete  Culture, routine-abscess     Status: None   Collection Time: 11/01/2015 11:27 AM  Result Value Ref Range Status   Specimen Description ABSCESS BUTTOCKS LEFT  Final   Special Requests NONE  Final   Gram Stain   Final    MODERATE WBC PRESENT,BOTH PMN AND MONONUCLEAR NO SQUAMOUS EPITHELIAL CELLS SEEN FEW GRAM POSITIVE COCCI IN PAIRS Performed at [facility]    Culture   Final  MODERATE GROUP B STREP(S.AGALACTIAE)ISOLATED Note: Beta hemolytic streptococci are predictably susceptible to penicillin and other beta lactams. Susceptibility testing not routinely performed. Performed at [facility]    Report Status 10/30/2015 FINAL  Final  C difficile quick scan w PCR reflex     Status: None   Collection Time: 10/29/15  4:34 PM  Result Value Ref Range Status   C Diff antigen NEGATIVE NEGATIVE Final   C Diff toxin NEGATIVE NEGATIVE Final   C Diff interpretation Negative for toxigenic C. difficile  Final  Culture, Urine     Status: None   Collection Time: 10/29/15  7:36 PM  Result Value Ref Range Status   Specimen Description URINE, RANDOM  Final   Special Requests NONE  Final   Culture NO GROWTH 1 DAY  Final   Report Status  10/30/2015 FINAL  Final  MRSA PCR Screening     Status: None   Collection Time: 10/30/15  7:24 PM  Result Value Ref Range Status   MRSA by PCR NEGATIVE NEGATIVE Final    Comment:        The GeneXpert MRSA Assay (FDA approved for NASAL specimens only), is one component of a comprehensive MRSA colonization surveillance program. It is not intended to diagnose MRSA infection nor to guide or monitor treatment for MRSA infections.     Coagulation Studies: No results for input(s): LABPROT, INR in the last 72 hours.  Imaging: Ct Abdomen Pelvis Wo Contrast  10/29/2015  CLINICAL DATA:  Inflammatory bowel disease. Left gluteal abscess. Prominent leukocytosis. Renal failure. Inpatient. EXAM: CT ABDOMEN AND PELVIS WITHOUT CONTRAST TECHNIQUE: Multidetector CT imaging of the abdomen and pelvis was performed following the standard protocol without IV contrast. COMPARISON:  No prior CT abdomen/ pelvis. 10/28/2015 renal sonogram. FINDINGS: Examination is limited by streak artifact from the patient's upper extremities. Lower chest: Small bilateral pleural effusions with associated compressive atelectasis in the dependent lower lobes. Small pericardial effusion/ thickening. Hepatobiliary: Normal liver with no liver mass. Normal gallbladder with no radiopaque cholelithiasis. No biliary ductal dilatation. Pancreas: Normal, with no mass or duct dilation. Spleen: Normal size. No mass. Adrenals/Urinary Tract: Normal adrenals. No hydronephrosis. No renal stones. No contour deforming renal mass. Urinary bladder is completely collapsed by indwelling Foley catheter and not well evaluated. Gas in the nondependent bladder lumen is expected given instrumentation. Stomach/Bowel: Grossly normal stomach. There is an approximately 10 cm length segment of mid to distal small bowel in the anterior lower abdomen (series 2/image 77) demonstrating mild-to-moderate circumferential bowel wall thickening. Normal caliber small bowel.  Oral contrast reaches the distal rectum. Normal appendix. Normal large bowel with no diverticulosis, large bowel wall thickening or pericolonic fat stranding. Vascular/Lymphatic: Atherosclerotic nonaneurysmal abdominal aorta. Mild bilateral inguinal lymphadenopathy, largest 1.6 cm on the right (series 2/ image 99) and 1.6 cm on the left (series 2/ image 101. No additional pathologically enlarged lymph nodes in the abdomen or pelvis. Reproductive: Top-normal size prostate. Other: No pneumoperitoneum. Small volume abdominopelvic ascites. Musculoskeletal: No aggressive appearing focal osseous lesions. There are multiple small erosions along the sacroiliac joints bilaterally, in keeping with sacroiliitis. Symmetric mild gynecomastia. Anasarca. There is superficial subcutaneous fat stranding in the bilateral lower gluteal cleft surrounding a superficial surgical drain, with no focal drainable superficial or intra-abdominal fluid collections. IMPRESSION: 1. Prominent superficial subcutaneous fat stranding in the bilateral lower gluteal cleft surrounding a superficial surgical drain, with no focal drainable superficial or intra-abdominal fluid collections. 2. Approximately 10 cm in length small bowel loop in  the mid to distal small bowel with circumferential small bowel wall thickening, suggesting active enteritis. Symmetric erosive sacroiliitis. Normal large bowel. This constellation of findings is most suggestive of Crohn disease (not ulcerative colitis). 3. Third-spacing, including small bilateral pleural effusions, small pericardial effusion, small volume ascites and anasarca. 4. Mild bilateral inguinal lymphadenopathy, nonspecific. Electronically Signed   By: Delbert Phenix M.D.   On: 10/29/2015 15:48   Dg Chest 2 View  10/29/2015  CLINICAL DATA:  Dyspnea EXAM: CHEST  2 VIEW COMPARISON:  08/29/2015 chest radiograph. FINDINGS: Low lung volumes. Stable cardiomediastinal silhouette with normal heart size. No  pneumothorax. Small bilateral pleural effusions. No pulmonary edema. Patchy opacity in both lower lobes. IMPRESSION: 1. Small bilateral pleural effusions. 2. Patchy opacity in both lower lobes, favor atelectasis. Electronically Signed   By: Delbert Phenix M.D.   On: 10/29/2015 19:37   Mr Lumbar Spine Wo Contrast  10/31/2015  CLINICAL DATA:  Leukocytosis and fever with perirectal abscess status post irrigation and drainage x2. Lower extremity weakness with difficulty ambulating. History of chronic kidney disease and diabetes. EXAM: MRI LUMBAR SPINE WITHOUT CONTRAST TECHNIQUE: Multiplanar, multisequence MR imaging of the lumbar spine was performed. No intravenous contrast was administered. COMPARISON:  Abdominal pelvic CT 10/29/2015 FINDINGS: Segmentation: Conventional anatomy assumed, with the last open disc space designated L5-S1. Alignment:  Normal. Vertebrae: There is generalized decreased T1 and T2 marrow signal throughout the visualized bones which is attributed to chronic kidney disease. No acute osseous findings. No evidence of pars defect or discitis. Small erosions along the sacroiliac joints are likely related to the patient's renal disease and secondary hyperparathyroidism. No evidence of sacroiliac joint effusion to suggest infection. Conus medullaris: Extends to the L1 level and appears normal. No evidence of epidural fluid collection. Paraspinal and other soft tissues: There is extensive edema throughout the paraspinal soft tissues, subcutaneous fat and retroperitoneum. No focal fluid collections are seen to suggest infection. There is free pelvic fluid, similar to recent CT. Disc levels: All of the lumbar discs are well hydrated with maintained height. No evidence of disc herniation, spinal stenosis or nerve root encroachment. No significant facet arthropathy. IMPRESSION: 1. No evidence of spinal infection. Specifically, no evidence of discitis, facet joint effusion or epidural abscess. 2. No disc  herniation, spinal stenosis or nerve root encroachment. 3. Extensive soft tissue edema throughout the subcutaneous, retroperitoneal and peritoneal soft tissues, consistent with anasarca, similar to recent CT. 4. Marrow and sacroiliac changes attributed to underlying renal disease. Electronically Signed   By: Carey Bullocks M.D.   On: 10/31/2015 11:30       Assessment and plan per attending neurologist  Felicie Morn PA-C [facility] [phone]  10/31/2015, 12:19 PM   Assessment/Plan: 40 YO male with new onset bilateral LE weakness and areflexia following surgery 5 days ago. The acuity of the onset and the lack of progression reported by the patient since onset would argue against postoperative Guillain-Barr. Transverse myelitis or idiosyncratic reaction may also need to be considered.   1) MRI brain, thoracic and cervical spine 2) further recs following above imaging.   Ritta Slot, MD [facility] [phone]  If 7pm- 7am, please page neurology on call as listed in AMION.

## 2015-11-01 ENCOUNTER — Inpatient Hospital Stay (HOSPITAL_COMMUNITY): Payer: Managed Care, Other (non HMO)

## 2015-11-01 DIAGNOSIS — D72829 Elevated white blood cell count, unspecified: Secondary | ICD-10-CM

## 2015-11-01 DIAGNOSIS — R072 Precordial pain: Secondary | ICD-10-CM

## 2015-11-01 DIAGNOSIS — I319 Disease of pericardium, unspecified: Secondary | ICD-10-CM

## 2015-11-01 DIAGNOSIS — I6340 Cerebral infarction due to embolism of unspecified cerebral artery: Secondary | ICD-10-CM

## 2015-11-01 DIAGNOSIS — N183 Chronic kidney disease, stage 3 (moderate): Secondary | ICD-10-CM

## 2015-11-01 DIAGNOSIS — E1140 Type 2 diabetes mellitus with diabetic neuropathy, unspecified: Secondary | ICD-10-CM

## 2015-11-01 DIAGNOSIS — I214 Non-ST elevation (NSTEMI) myocardial infarction: Secondary | ICD-10-CM

## 2015-11-01 DIAGNOSIS — I639 Cerebral infarction, unspecified: Secondary | ICD-10-CM

## 2015-11-01 DIAGNOSIS — I3139 Other pericardial effusion (noninflammatory): Secondary | ICD-10-CM | POA: Insufficient documentation

## 2015-11-01 DIAGNOSIS — I313 Pericardial effusion (noninflammatory): Secondary | ICD-10-CM | POA: Insufficient documentation

## 2015-11-01 DIAGNOSIS — L0231 Cutaneous abscess of buttock: Secondary | ICD-10-CM | POA: Insufficient documentation

## 2015-11-01 LAB — RENAL FUNCTION PANEL
Anion gap: 13 (ref 5–15)
BUN: 47 mg/dL — AB (ref 6–20)
CO2: 15 mmol/L — ABNORMAL LOW (ref 22–32)
CREATININE: 3.63 mg/dL — AB (ref 0.61–1.24)
Calcium: 7.1 mg/dL — ABNORMAL LOW (ref 8.9–10.3)
Chloride: 102 mmol/L (ref 101–111)
GFR, EST AFRICAN AMERICAN: 23 mL/min — AB (ref >60)
GFR, EST NON AFRICAN AMERICAN: 19 mL/min — AB (ref >60)
Glucose, Bld: 203 mg/dL — ABNORMAL HIGH (ref 65–99)
PHOSPHORUS: 6 mg/dL — AB (ref 2.5–4.6)
Potassium: 3 mmol/L — ABNORMAL LOW (ref 3.5–5.1)
Sodium: 130 mmol/L — ABNORMAL LOW (ref 135–145)

## 2015-11-01 LAB — GLUCOSE, CAPILLARY
GLUCOSE-CAPILLARY: 193 mg/dL — AB (ref 65–99)
GLUCOSE-CAPILLARY: 175 mg/dL — AB (ref 65–99)
Glucose-Capillary: 175 mg/dL — ABNORMAL HIGH (ref 65–99)
Glucose-Capillary: 138 mg/dL — ABNORMAL HIGH (ref 65–99)

## 2015-11-01 LAB — CBC WITH DIFFERENTIAL/PLATELET
BASOS ABS: 0 10*3/uL (ref 0.0–0.1)
Basophils Relative: 0 %
EOS ABS: 0 10*3/uL (ref 0.0–0.7)
Eosinophils Relative: 0 %
HCT: 25.2 % — ABNORMAL LOW (ref 39.0–52.0)
Hemoglobin: 8.6 g/dL — ABNORMAL LOW (ref 13.0–17.0)
LYMPHS ABS: 1.5 10*3/uL (ref 0.7–4.0)
Lymphocytes Relative: 3 %
MCH: 26.5 pg (ref 26.0–34.0)
MCHC: 34.1 g/dL (ref 30.0–36.0)
MCV: 77.5 fL — ABNORMAL LOW (ref 78.0–100.0)
MONO ABS: 1.5 10*3/uL — AB (ref 0.1–1.0)
Monocytes Relative: 3 %
NEUTROS PCT: 94 %
Neutro Abs: 47.6 10*3/uL — ABNORMAL HIGH (ref 1.7–7.7)
PLATELETS: 482 10*3/uL — AB (ref 150–400)
RBC: 3.25 MIL/uL — AB (ref 4.22–5.81)
RDW: 14.3 % (ref 11.5–15.5)
WBC: 50.6 10*3/uL — AB (ref 4.0–10.5)

## 2015-11-01 LAB — LIPID PANEL
CHOLESTEROL: 140 mg/dL (ref 0–200)
Triglycerides: 217 mg/dL — ABNORMAL HIGH (ref <150)
VLDL: 43 mg/dL — AB (ref 0–40)

## 2015-11-01 LAB — TROPONIN I
TROPONIN I: 0.32 ng/mL — AB (ref <0.031)
TROPONIN I: 0.13 ng/mL — AB (ref <0.031)
Troponin I: 0.54 ng/mL (ref <0.031)

## 2015-11-01 LAB — COMPLEMENT, TOTAL

## 2015-11-01 LAB — C4 COMPLEMENT: COMPLEMENT C4, BODY FLUID: 34 mg/dL (ref 14–44)

## 2015-11-01 LAB — MAGNESIUM: Magnesium: 1.9 mg/dL (ref 1.7–2.4)

## 2015-11-01 LAB — ANAEROBIC CULTURE

## 2015-11-01 LAB — ANTISTREPTOLYSIN O TITER: ASO: <20 IU/mL (ref 0.0–200.0)

## 2015-11-01 LAB — C3 COMPLEMENT: C3 Complement: 158 mg/dL (ref 82–167)

## 2015-11-01 MED ORDER — SODIUM CHLORIDE 0.9 % IV SOLN: INTRAVENOUS | Status: DC

## 2015-11-01 MED ORDER — POTASSIUM CHLORIDE CRYS ER 20 MEQ PO TBCR
40.0000 meq | EXTENDED_RELEASE_TABLET | Freq: Once | ORAL | Status: AC
  Administered 2015-11-01: 40 meq via ORAL
  Filled 2015-11-01: qty 2

## 2015-11-01 MED ORDER — SODIUM CHLORIDE 0.9 % IV SOLN
INTRAVENOUS | Status: DC
  Administered 2015-11-01: 75 mL/h via INTRAVENOUS

## 2015-11-01 MED ORDER — ASPIRIN 325 MG PO TABS
325.0000 mg | ORAL_TABLET | Freq: Every day | ORAL | Status: DC
  Administered 2015-11-01 – 2015-11-13 (×14): 325 mg via ORAL
  Filled 2015-11-01 (×15): qty 1

## 2015-11-01 MED ORDER — ATORVASTATIN CALCIUM 80 MG PO TABS
80.0000 mg | ORAL_TABLET | Freq: Every day | ORAL | Status: DC
  Administered 2015-11-01 – 2015-11-13 (×12): 80 mg via ORAL
  Filled 2015-11-01 (×11): qty 1

## 2015-11-01 MED ORDER — VITAMIN B-1 100 MG PO TABS
100.0000 mg | ORAL_TABLET | Freq: Every day | ORAL | Status: DC
  Administered 2015-11-01 – 2015-11-13 (×13): 100 mg via ORAL
  Filled 2015-11-01 (×14): qty 1

## 2015-11-01 MED ORDER — HEPARIN (PORCINE) IN NACL 100-0.45 UNIT/ML-% IJ SOLN
1100.0000 [IU]/h | INTRAMUSCULAR | Status: DC
  Administered 2015-11-01: 850 [IU]/h via INTRAVENOUS
  Filled 2015-11-01: qty 250

## 2015-11-01 NOTE — Progress Notes (Signed)
Pt assessed head to toe. Noted 4 + edema back of legs -skin is tight - made Dr Cena Benton aware of this - orders received. Pt says he wants to move his Rt leg but can't - flicker noted. Can move Lt leg a little very weak push - cannot lift either leg off the bed. Penrose drain intact - mod amt serosang drainage noted on the chux. Pt unable to do sitz baths as ordered due to leg weakness,  not able to get up to [facility] for treatment

## 2015-11-01 NOTE — Progress Notes (Signed)
VASCULAR LAB PRELIMINARY  PRELIMINARY  PRELIMINARY    Carotid Duplex has been completed.    Bilateral:  No evidence of ICA stenosis.  Vertebral artery flow is antegrade.      Jenetta Loges, RVT, RDMS 11/01/2015, 3:28 PM

## 2015-11-01 NOTE — Progress Notes (Signed)
Call made to vascular lab to ask if exams scheduled through their department can be done sooner due to pt's symptoms. Vascular made aware of pt's hard leg edema on backside of his legs from lower buttocks to bilat knees.

## 2015-11-01 NOTE — Progress Notes (Signed)
Physical Therapy Treatment Patient Details Name: Shane Dougherty MRN: [id number] DOB: 1976/03/11 Today's Date: 11/01/2015    History of Present Illness Pt is a 40 y/o male who presents s/p I&D of L gluteal abscess. Pt had been seen in ED a few days prior to admission and also had an I&D done at that time.PMH significant for DM.     PT Comments    Pt admitted with above diagnosis. Pt currently with functional limitations due to balance and endurance deficits as well as strength deficits.  Pt only able to perform exercises as pt with poor lab values and having testing done later in day.  Pt tolerating exercises well.   Pt will benefit from skilled PT to increase their independence and safety with mobility to allow discharge to the venue listed below.    Follow Up Recommendations  CIR;Supervision/Assistance - 24 hour     Equipment Recommendations  Rolling walker with 5" wheels;3in1 (PT)    Recommendations for Other Services Rehab consult     Precautions / Restrictions Precautions Precautions: Fall Restrictions Weight Bearing Restrictions: No    Mobility  Bed Mobility                  Transfers                 General transfer comment: Unable  Ambulation/Gait             General Gait Details: Unable   Stairs            Wheelchair Mobility    Modified Rankin (Stroke Patients Only)       Balance                                    Cognition Arousal/Alertness: Awake/alert Behavior During Therapy: Flat affect Overall Cognitive Status: Within Functional Limits for tasks assessed                      Exercises General Exercises - Upper Extremity Shoulder Flexion: AROM;Both;10 reps;Supine;AAROM Shoulder ABduction: AROM;Both;10 reps;Supine Elbow Flexion: AROM;Both;10 reps;Supine Elbow Extension: AROM;Both;10 reps;Supine Wrist Flexion: AROM;Both;10 reps;Supine General Exercises - Lower Extremity Ankle Circles/Pumps:  AROM;Both;10 reps;Supine Quad Sets: AROM;Both;5 reps;Supine Heel Slides: Both;10 reps;Supine;AAROM Hip ABduction/ADduction: Both;10 reps;Supine;AAROM Straight Leg Raises: AAROM;Both;10 reps;Supine    General Comments General comments (skin integrity, edema, etc.): Talked to pt about positioning of UEs on pillows and retrograde massage as well as ROM to decr edema in UES.       Pertinent Vitals/Pain Pain Assessment: Faces Faces Pain Scale: Hurts even more Pain Location: hips Pain Descriptors / Indicators: Aching Pain Intervention(s): Limited activity within patient's tolerance;Monitored during session;Premedicated before session;Repositioned  VSS    Home Living                      Prior Function            PT Goals (current goals can now be found in the care plan section) Progress towards PT goals: Not progressing toward goals - comment (limited by lab values and medical issues.)    Frequency  Min 3X/week    PT Plan Current plan remains appropriate    Co-evaluation             End of Session   Activity Tolerance: Patient limited by fatigue Patient left: in bed;with call bell/phone within reach;with bed alarm  set     Time: 0981-1914 PT Time Calculation (min) (ACUTE ONLY): 22 min  Charges:  $Therapeutic Exercise: 8-22 mins                    G Codes:      Berline Lopes Nov 25, 2015, 1:44 PM [employer] [facility] [phone] (pager)

## 2015-11-01 NOTE — Progress Notes (Signed)
NIF/VC not indicated at this time, pt is sleeping comfortably no distress or complications noted

## 2015-11-01 NOTE — Progress Notes (Signed)
Pt not in room during rounds for NIF, VC.

## 2015-11-01 NOTE — Consult Note (Signed)
Subjective:  Doing well today. Upset about the recent MRI results Objective Vital signs in last 24 hours: Filed Vitals:   11/01/15 0323 11/01/15 0754 11/01/15 0800 11/01/15 1000  BP:  103/67 115/66 131/72  Pulse:  78 78 92  Temp: 98.5 F (36.9 C) 98.6 F (37 C)    TempSrc: Oral Oral    Resp:  11 11   Height:      Weight:      SpO2:  99% 99% 98%   Weight change:   Intake/Output Summary (Last 24 hours) at 11/01/15 1215 Last data filed at 11/01/15 1100  Gross per 24 hour  Intake 1256.67 ml  Output   1350 ml  Net -93.33 ml    Assessment/ Plan: Pt is a 40 y.o. yo male who was admitted on October 30, 2015 with a left buttock abscess, noted to have AoCKD possibly 2/2 ATN from hypoperfusion from sepsis (abscess).  Assessment/Plan: 1. Acute on Chronic Kidney Disease; in setting of Sepsis; possible ischemic ATN: (CKD likely 2/2 poorly controlled DM) UOP greatly improved at 1.1L in 24hrs. Cr improved to 3.63 today, w/ Baseline around 1.6. No lasix for now. Labs for GN were unremarkable. Getting PTH as Phos is 6.0. Will follow.   2. BP/Volume: UOP 1.1L; up 11.2L since admission. BPs good now. 3. Anemia: Hgb 8.6 but stable. Will monitor. WBC high at 50.6.  4. Abscess: per surgery 5. Diarrhea: resolved. C Diff PCR neg.   6. Diabetes: poorly controlled. Likely cause of CKD. Management per primary team.  Mickie Hillier    Labs: Basic Metabolic Panel:  Recent Labs Lab 10/30/15 0603 10/31/15 0349 11/01/15 0538  NA 131* 132* 130*  K 3.5 3.3* 3.0*  CL 101 105 102  CO2 13* 14* 15*  GLUCOSE 26* 101* 203*  BUN 44* 45* 47*  CREATININE 4.57* 4.17* 3.63*  CALCIUM 7.4* 7.1* 7.1*  PHOS 6.6* 6.6* 6.0*   Liver Function Tests:  Recent Labs Lab 10/29/15 0311  10/30/15 0603 10/31/15 0349 11/01/15 0538  AST 30  --   --   --   --   ALT 8*  --   --   --   --   ALKPHOS 521*  --   --   --   --   BILITOT 1.0  --   --   --   --   PROT 5.2*  --   --   --   --   ALBUMIN 1.0*  < > 1.0* <1.0* <1.0*   < > = values in this interval not displayed. No results for input(s): LIPASE, AMYLASE in the last 168 hours. No results for input(s): AMMONIA in the last 168 hours. CBC:  Recent Labs Lab 10/30/15 1318  10/29/15 10/29/15 0311 10/30/15 0913 10/31/15 0349 11/01/15 0538  WBC 22.8*  < > 42.7* 46.0* 49.9* 53.3* 50.6*  NEUTROABS 20.7*  --   --   --   --   --  47.6*  HGB 10.6*  < > 8.7* 8.5* 8.3* 8.5* 8.6*  HCT 31.6*  < > 26.5* 26.4* 25.9* 24.9* 25.2*  MCV 78.6  < > 79.8 81.7 79.0 78.5 77.5*  PLT 453*  < > 450* 479* 438* 445* 482*  < > = values in this interval not displayed. Cardiac Enzymes:  Recent Labs Lab 10/31/15 1855 10/31/15 2244 11/01/15 0538  TROPONINI 0.08* 0.20* 0.32*   CBG:  Recent Labs Lab 10/31/15 0846 10/31/15 1201 10/31/15 1754 10/31/15 2258 11/01/15 0753  GLUCAP 160* 116*  159* 190* 175*    Iron Studies: No results for input(s): IRON, TIBC, TRANSFERRIN, FERRITIN in the last 72 hours. Studies/Results: Mr Brain Wo Contrast  10/31/2015  CLINICAL DATA:  Recent drainage of left gluteal abscess. Now with leg weakness. Diabetes EXAM: MRI HEAD WITHOUT CONTRAST MRI CERVICAL SPINE WITHOUT CONTRAST TECHNIQUE: Multiplanar, multiecho pulse sequences of the brain and surrounding structures, and cervical spine, to include the craniocervical junction and cervicothoracic junction, were obtained without intravenous contrast. COMPARISON:  MRI 08/20/2014 FINDINGS: MRI HEAD FINDINGS Multiple small foci of restricted diffusion in the cerebral hemispheres bilaterally. These are present in the posterior temporal lobe bilaterally, the left parietal periventricular white matter, frontal lobes bilaterally. Sparing of the brainstem and cerebellum. 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 Ventricle size normal.  Cerebral volume normal. Negative for intracranial hemorrhage. Negative for mass or edema. No shift of the midline structures. Mucosal  thickening throughout the paranasal sinuses. Mastoid sinus effusion bilaterally. Pituitary and skull base normal.  Circle Willis patent. MRI CERVICAL SPINE FINDINGS Image quality degraded by motion. Straightening of the cervical lordosis with mild kyphosis. Normal alignment. Negative for fracture or mass Spinal cord signal is normal.  No cord compression or cord lesion. Mild cervical degenerative change. No disc protrusion or spinal stenosis. Diffusion-weighted imaging of the cord negative for infarct. 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 Negative for intracranial hemorrhage or mass No acute abnormality in the cervical spine. Negative for spinal stenosis. Electronically Signed   By: Marlan Palau M.D.   On: 10/31/2015 21:11   Mr Cervical Spine Wo Contrast  10/31/2015  CLINICAL DATA:  Recent drainage of left gluteal abscess. Now with leg weakness. Diabetes EXAM: MRI HEAD WITHOUT CONTRAST MRI CERVICAL SPINE WITHOUT CONTRAST TECHNIQUE: Multiplanar, multiecho pulse sequences of the brain and surrounding structures, and cervical spine, to include the craniocervical junction and cervicothoracic junction, were obtained without intravenous contrast. COMPARISON:  MRI 08/20/2014 FINDINGS: MRI HEAD FINDINGS Multiple small foci of restricted diffusion in the cerebral hemispheres bilaterally. These are present in the posterior temporal lobe bilaterally, the left parietal periventricular white matter, frontal lobes bilaterally. Sparing of the brainstem and cerebellum. 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 Ventricle size normal.  Cerebral volume normal. Negative for intracranial hemorrhage. Negative for mass or edema. No shift of the midline structures. Mucosal thickening throughout the paranasal sinuses. Mastoid sinus effusion bilaterally. Pituitary and skull base normal.  Circle  Willis patent. MRI CERVICAL SPINE FINDINGS Image quality degraded by motion. Straightening of the cervical lordosis with mild kyphosis. Normal alignment. Negative for fracture or mass Spinal cord signal is normal.  No cord compression or cord lesion. Mild cervical degenerative change. No disc protrusion or spinal stenosis. Diffusion-weighted imaging of the cord negative for infarct. 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 Negative for intracranial hemorrhage or mass No acute abnormality in the cervical spine. Negative for spinal stenosis. Electronically Signed   By: Marlan Palau M.D.   On: 10/31/2015 21:11   Mr Lumbar Spine Wo Contrast  10/31/2015  CLINICAL DATA:  Leukocytosis and fever with perirectal abscess status post irrigation and drainage x2. Lower extremity weakness with difficulty ambulating. History of chronic kidney disease and diabetes. EXAM: MRI LUMBAR SPINE WITHOUT CONTRAST TECHNIQUE: Multiplanar, multisequence MR imaging of the lumbar spine was performed. No intravenous  contrast was administered. COMPARISON:  Abdominal pelvic CT 10/29/2015 FINDINGS: Segmentation: Conventional anatomy assumed, with the last open disc space designated L5-S1. Alignment:  Normal. Vertebrae: There is generalized decreased T1 and T2 marrow signal throughout the visualized bones which is attributed to chronic kidney disease. No acute osseous findings. No evidence of pars defect or discitis. Small erosions along the sacroiliac joints are likely related to the patient's renal disease and secondary hyperparathyroidism. No evidence of sacroiliac joint effusion to suggest infection. Conus medullaris: Extends to the L1 level and appears normal. No evidence of epidural fluid collection. Paraspinal and other soft tissues: There is extensive edema throughout the paraspinal soft tissues, subcutaneous fat and retroperitoneum. No focal fluid  collections are seen to suggest infection. There is free pelvic fluid, similar to recent CT. Disc levels: All of the lumbar discs are well hydrated with maintained height. No evidence of disc herniation, spinal stenosis or nerve root encroachment. No significant facet arthropathy. IMPRESSION: 1. No evidence of spinal infection. Specifically, no evidence of discitis, facet joint effusion or epidural abscess. 2. No disc herniation, spinal stenosis or nerve root encroachment. 3. Extensive soft tissue edema throughout the subcutaneous, retroperitoneal and peritoneal soft tissues, consistent with anasarca, similar to recent CT. 4. Marrow and sacroiliac changes attributed to underlying renal disease. Electronically Signed   By: Carey Bullocks M.D.   On: 10/31/2015 11:30   Medications: Infusions: . sodium chloride 75 mL/hr (11/01/15 1000)    Scheduled Medications: . ampicillin-sulbactam (UNASYN) IV  1.5 g Intravenous Q12H  . aspirin  325 mg Oral QHS  . cholecalciferol  2,000 Units Oral Daily  . docusate sodium  100 mg Oral BID  . heparin  5,000 Units Subcutaneous Q8H  . insulin aspart  0-9 Units Subcutaneous TID WC  . insulin glargine  15 Units Subcutaneous QHS  . thiamine  100 mg Oral Daily    have reviewed scheduled and prn medications.  Physical Exam: General -- NAD, laying in bed, talking to brother on phone. Chest -- good expansion. Lungs clear to auscultation. Cardiac -- RRR. No murmurs noted.  Abdomen -- soft, nontender. No masses palpable. Bowel sounds present.  Extremeties - +1 pitting edema in LE bilaterally. Dorsalis pedis pulses present and symmetrical.    Kathee Delton, MD,MS,  PGY2 11/01/2015 12:15 PM  I have seen and examined this patient and agree with plan as outlined by Dr. Wende Mott.  Good UOP and Scr slowly improving.  No indication for dialysis.  Continue with current medical regimen. Green A Mickala Laton,MD 11/01/2015 1:04 PM

## 2015-11-01 NOTE — Progress Notes (Signed)
Text paged to Cardiology regarding elevated troponin of 0.54

## 2015-11-01 NOTE — Progress Notes (Signed)
PROGRESS NOTE    Shane Dougherty  WUJ:[id number] DOB: 1975-09-10 DOA: 11/04/2015 PCP: Jaclyn Shaggy, MD   Outpatient Specialists: Jaclyn Shaggy, MD  Brief Narrative:  Shane Dougherty is a 40 y.o. male with medical history significant of diabetes. He complains for diarrhea x 1 month. Nothing made better or worse. No history of crohn's/UC- no family hx either. He then a few days before 4/16 developed left buttock pain. He was seen in the ER where he was diagnosed with intergluteal infection with small abscess. An I/D was done that some purulent drainage. He was given norco, clindamycin and told to do warm soaks.  He presented back at [facility] [place] with worsening pain and pressure. tx to [facility] for further I&D in operating room.  Had urinary retention and foley placed.  Creatine and Urine output did not respond to fluid boluses.   Assessment & Plan:   Active Problems:   Hyperglycemia due to type 2 diabetes mellitus (HCC)   Hyponatremia   Essential hypertension   Diabetic neuropathy, type II diabetes mellitus (HCC)   Peri-rectal abscess   CKD (chronic kidney disease)   Leukocytosis   AKI (acute kidney injury) (HCC)   Pressure ulcer   Peri-rectal abscess - s/p I&D - blood cultures- NGTD - culture from I&D: gram + cocci in pairs- group B strep - HIV negative - pain control - Continue IV Abx- vanc and zosyn until cx back  ? Crohn's disease GI consult and recommended primary treatment of his perirectal abscess. With improvement in renal function they recommend more advanced imaging to evaluate small bowel such a CT enterography or MR enterography. We will plan on having GI evaluated patient as an outpatient.  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  Acute kidney injury  on CKD stage 3 -baseline Cr around 1.6-1.8 -had I/O cath for urinary retention-- placed foley -renal U/S- no hydronephrosis but pleural effusions - nephrology consult as patient has 3rd spacing and Cr not improved-- ? Need for lasix? -U/A ordered  Severe protein calorie malnutrition -albumin 1  Weakness -states he is unable to move LE (walked with cane before admission.Marland Kitchen) -sensation intact -B12 Thiamine level- IV thiamine -MRI of lumbar spine results reviewed - Neurology on board  Elevated troponin - consulted cardiology  Small pleural effusions -echo pending  DVT prophylaxis:  Lovenox  Code Status: Full Code   Family Communication: none discussed with patient directly   Disposition Plan:  tx to SDU   Consultants:   CCS  nephrology  Procedures:   I&D   Subjective: No new complaints reported to me.  Objective: Filed Vitals:   11/01/15 0323 11/01/15 0754 11/01/15 0800 11/01/15 1000  BP:  103/67 115/66 131/72  Pulse:  78 78 92  Temp: 98.5 F (36.9 C) 98.6 F (37 C)    TempSrc: Oral Oral    Resp:  11 11   Height:      Weight:      SpO2:  99% 99% 98%    Intake/Output Summary (Last 24 hours) at 11/01/15 1227 Last data filed at 11/01/15 1100  Gross per 24 hour  Intake  1256.67 ml  Output   1350 ml  Net -93.33 ml   Filed Weights   11/03/2015 1154 10/13/2015 1800  Weight: 65.318 kg (144 lb) 67.132 kg (148 lb)    Examination:  General exam: Pt in nad, alert and awake Respiratory system: Clear to auscultation anterior- Respiratory effort normal. Cardiovascular system: S1 & S2 heard, RRR. No JVD, murmurs, rubs, gallops or clicks. +edema in hands/feet Gastrointestinal system: Abdomen is nondistended, soft and nontender. No organomegaly or masses felt. Normal bowel sounds  heard. Psychiatry: mood and affect appropriate    Data Reviewed: I have personally reviewed following labs and imaging studies  CBC:  Recent Labs Lab 11/03/2015 1318  10/29/15 10/29/15 0311 10/30/15 0913 10/31/15 0349 11/01/15 0538  WBC 22.8*  < > 42.7* 46.0* 49.9* 53.3* 50.6*  NEUTROABS 20.7*  --   --   --   --   --  47.6*  HGB 10.6*  < > 8.7* 8.5* 8.3* 8.5* 8.6*  HCT 31.6*  < > 26.5* 26.4* 25.9* 24.9* 25.2*  MCV 78.6  < > 79.8 81.7 79.0 78.5 77.5*  PLT 453*  < > 450* 479* 438* 445* 482*  < > = values in this interval not displayed. Basic Metabolic Panel:  Recent Labs Lab 10/29/15 0311 10/29/15 1614 10/30/15 0603 10/31/15 0349 11/01/15 0538  NA 135 132* 131* 132* 130*  K 3.5 3.6 3.5 3.3* 3.0*  CL 105 104 101 105 102  CO2 15* 13* 13* 14* 15*  GLUCOSE 108* 98 26* 101* 203*  BUN 38* 40* 44* 45* 47*  CREATININE 3.88* 4.29* 4.57* 4.17* 3.63*  CALCIUM 7.2* 7.4* 7.4* 7.1* 7.1*  PHOS  --  6.0* 6.6* 6.6* 6.0*   GFR: Estimated Creatinine Clearance: 25.3 mL/min (by C-G formula based on Cr of 3.63). Liver Function Tests:  Recent Labs Lab 10/29/15 0311 10/29/15 1614 10/30/15 0603 10/31/15 0349 11/01/15 0538  AST 30  --   --   --   --   ALT 8*  --   --   --   --   ALKPHOS 521*  --   --   --   --   BILITOT 1.0  --   --   --   --   PROT 5.2*  --   --   --   --   ALBUMIN 1.0* <1.0* 1.0* <1.0* <1.0*   No results for input(s): LIPASE, AMYLASE in the last 168 hours. No results for input(s): AMMONIA in the last 168 hours. Coagulation Profile: No results for input(s): INR, PROTIME in the last 168 hours. Cardiac Enzymes:  Recent Labs Lab 10/31/15 1855 10/31/15 2244 11/01/15 0538  TROPONINI 0.08* 0.20* 0.32*   BNP (last 3 results) No results for input(s): PROBNP in the last 8760 hours. HbA1C: No results for input(s): HGBA1C in the last 72 hours. CBG:  Recent Labs Lab 10/31/15 0846 10/31/15 1201 10/31/15 1754 10/31/15 2258 11/01/15 0753  GLUCAP 160* 116*  159* 190* 175*   Lipid Profile: No results for input(s): CHOL, HDL, LDLCALC, TRIG, CHOLHDL, LDLDIRECT in the last 72 hours. Thyroid Function Tests: No results for input(s): TSH, T4TOTAL, FREET4, T3FREE, THYROIDAB in the last 72 hours. Anemia Panel:  Recent Labs  10/30/15 0913  VITAMINB12 3293*   Urine analysis:    Component Value Date/Time   COLORURINE YELLOW 10/29/2015 1020   APPEARANCEUR TURBID* 10/29/2015 1020   LABSPEC 1.026 10/29/2015 1020   PHURINE 5.0 10/29/2015 1020   GLUCOSEU NEGATIVE 10/29/2015 1020   HGBUR  MODERATE* 10/29/2015 1020   BILIRUBINUR NEGATIVE 10/29/2015 1020   KETONESUR NEGATIVE 10/29/2015 1020   PROTEINUR 100* 10/29/2015 1020   UROBILINOGEN 1.0 08/20/2014 1042   NITRITE NEGATIVE 10/29/2015 1020   LEUKOCYTESUR MODERATE* 10/29/2015 1020   Recent Results (from the past 240 hour(s))  Culture, blood (Routine X 2) w Reflex to ID Panel     Status: None   Collection Time: 10/10/2015  7:52 PM  Result Value Ref Range Status   Specimen Description BLOOD LEFT ANTECUBITAL  Final   Special Requests BOTTLES DRAWN AEROBIC ONLY 10CC  Final   Culture NO GROWTH 5 DAYS  Final   Report Status 10/31/2015 FINAL  Final  Culture, blood (Routine X 2) w Reflex to ID Panel     Status: None   Collection Time: 10/27/2015  8:00 PM  Result Value Ref Range Status   Specimen Description BLOOD LEFT FOREARM  Final   Special Requests BOTTLES DRAWN AEROBIC ONLY 9CC  Final   Culture NO GROWTH 5 DAYS  Final   Report Status 10/31/2015 FINAL  Final  Surgical pcr screen     Status: None   Collection Time: 2015-11-13  8:27 AM  Result Value Ref Range Status   MRSA, PCR NEGATIVE NEGATIVE Final   Staphylococcus aureus NEGATIVE NEGATIVE Final    Comment:        The Xpert SA Assay (FDA approved for NASAL specimens in patients over 50 years of age), is one component of a comprehensive surveillance program.  Test performance has been validated by [facility] for patients greater than or  equal to 3 year old. It is not intended to diagnose infection nor to guide or monitor treatment.   Anaerobic culture     Status: None   Collection Time: 11/13/15 11:27 AM  Result Value Ref Range Status   Specimen Description ABSCESS BUTTOCKS LEFT  Final   Special Requests NONE  Final   Gram Stain   Final    MODERATE WBC PRESENT,BOTH PMN AND MONONUCLEAR NO SQUAMOUS EPITHELIAL CELLS SEEN FEW GRAM POSITIVE COCCI IN PAIRS Performed at [facility]    Culture   Final    NO ANAEROBES ISOLATED Performed at [facility]    Report Status 11/01/2015 FINAL  Final  Culture, routine-abscess     Status: None   Collection Time: Nov 13, 2015 11:27 AM  Result Value Ref Range Status   Specimen Description ABSCESS BUTTOCKS LEFT  Final   Special Requests NONE  Final   Gram Stain   Final    MODERATE WBC PRESENT,BOTH PMN AND MONONUCLEAR NO SQUAMOUS EPITHELIAL CELLS SEEN FEW GRAM POSITIVE COCCI IN PAIRS Performed at [facility]    Culture   Final    MODERATE GROUP B STREP(S.AGALACTIAE)ISOLATED Note: Beta hemolytic streptococci are predictably susceptible to penicillin and other beta lactams. Susceptibility testing not routinely performed. Performed at [facility]    Report Status 10/30/2015 FINAL  Final  C difficile quick scan w PCR reflex     Status: None   Collection Time: 10/29/15  4:34 PM  Result Value Ref Range Status   C Diff antigen NEGATIVE NEGATIVE Final   C Diff toxin NEGATIVE NEGATIVE Final   C Diff interpretation Negative for toxigenic C. difficile  Final  Culture, Urine     Status: None   Collection Time: 10/29/15  7:36 PM  Result Value Ref Range Status   Specimen Description URINE, RANDOM  Final   Special Requests NONE  Final  Culture NO GROWTH 1 DAY  Final   Report Status 10/30/2015 FINAL  Final  MRSA PCR Screening     Status: None   Collection Time: 10/30/15  7:24 PM  Result Value Ref Range Status   MRSA by PCR NEGATIVE NEGATIVE Final     Comment:        The GeneXpert MRSA Assay (FDA approved for NASAL specimens only), is one component of a comprehensive MRSA colonization surveillance program. It is not intended to diagnose MRSA infection nor to guide or monitor treatment for MRSA infections.       Anti-infectives    Start     Dose/Rate Route Frequency Ordered Stop   10/31/15 1300  ampicillin-sulbactam (UNASYN) 1.5 g in sodium chloride 0.9 % 50 mL IVPB     1.5 g 100 mL/hr over 30 Minutes Intravenous Every 12 hours 10/31/15 1214     10/29/15 2000  metroNIDAZOLE (FLAGYL) IVPB 500 mg  Status:  Discontinued     500 mg 100 mL/hr over 60 Minutes Intravenous Every 8 hours 10/29/15 1852 10/30/15 1312   10/29/15 0800  vancomycin (VANCOCIN) IVPB 750 mg/150 ml premix  Status:  Discontinued     750 mg 150 mL/hr over 60 Minutes Intravenous Every 24 hours 10/28/15 1058 10/31/15 0750   10/16/2015 0800  vancomycin (VANCOCIN) 500 mg in sodium chloride 0.9 % 100 mL IVPB  Status:  Discontinued     500 mg 100 mL/hr over 60 Minutes Intravenous Every 12 hours 10/20/2015 1931 10/28/15 1058   10/29/2015 0200  piperacillin-tazobactam (ZOSYN) IVPB 3.375 g  Status:  Discontinued     3.375 g 12.5 mL/hr over 240 Minutes Intravenous Every 8 hours 10/12/2015 1938 10/31/15 1205   11/05/2015 2000  piperacillin-tazobactam (ZOSYN) IVPB 3.375 g     3.375 g 100 mL/hr over 30 Minutes Intravenous  Once 10/08/2015 1903 10/20/2015 2117   10/20/2015 2000  vancomycin (VANCOCIN) IVPB 1000 mg/200 mL premix     1,000 mg 200 mL/hr over 60 Minutes Intravenous  Once 10/15/2015 1903 10/29/2015 2147       Radiology Studies: Mr Brain Wo Contrast  10/31/2015  CLINICAL DATA:  Recent drainage of left gluteal abscess. Now with leg weakness. Diabetes EXAM: MRI HEAD WITHOUT CONTRAST MRI CERVICAL SPINE WITHOUT CONTRAST TECHNIQUE: Multiplanar, multiecho pulse sequences of the brain and surrounding structures, and cervical spine, to include the craniocervical junction and  cervicothoracic junction, were obtained without intravenous contrast. COMPARISON:  MRI 08/20/2014 FINDINGS: MRI HEAD FINDINGS Multiple small foci of restricted diffusion in the cerebral hemispheres bilaterally. These are present in the posterior temporal lobe bilaterally, the left parietal periventricular white matter, frontal lobes bilaterally. Sparing of the brainstem and cerebellum. 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 Ventricle size normal.  Cerebral volume normal. Negative for intracranial hemorrhage. Negative for mass or edema. No shift of the midline structures. Mucosal thickening throughout the paranasal sinuses. Mastoid sinus effusion bilaterally. Pituitary and skull base normal.  Circle Willis patent. MRI CERVICAL SPINE FINDINGS Image quality degraded by motion. Straightening of the cervical lordosis with mild kyphosis. Normal alignment. Negative for fracture or mass Spinal cord signal is normal.  No cord compression or cord lesion. Mild cervical degenerative change. No disc protrusion or spinal stenosis. Diffusion-weighted imaging of the cord negative for infarct. 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 Negative for intracranial hemorrhage or mass No acute abnormality in the cervical spine.  Negative for spinal stenosis. Electronically Signed   By: Marlan Palau M.D.   On: 10/31/2015 21:11   Mr Cervical Spine Wo Contrast  10/31/2015  CLINICAL DATA:  Recent drainage of left gluteal abscess. Now with leg weakness. Diabetes EXAM: MRI HEAD WITHOUT CONTRAST MRI CERVICAL SPINE WITHOUT CONTRAST TECHNIQUE: Multiplanar, multiecho pulse sequences of the brain and surrounding structures, and cervical spine, to include the craniocervical junction and cervicothoracic junction, were obtained without intravenous contrast. COMPARISON:  MRI 08/20/2014 FINDINGS: MRI HEAD FINDINGS  Multiple small foci of restricted diffusion in the cerebral hemispheres bilaterally. These are present in the posterior temporal lobe bilaterally, the left parietal periventricular white matter, frontal lobes bilaterally. Sparing of the brainstem and cerebellum. 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 Ventricle size normal.  Cerebral volume normal. Negative for intracranial hemorrhage. Negative for mass or edema. No shift of the midline structures. Mucosal thickening throughout the paranasal sinuses. Mastoid sinus effusion bilaterally. Pituitary and skull base normal.  Circle Willis patent. MRI CERVICAL SPINE FINDINGS Image quality degraded by motion. Straightening of the cervical lordosis with mild kyphosis. Normal alignment. Negative for fracture or mass Spinal cord signal is normal.  No cord compression or cord lesion. Mild cervical degenerative change. No disc protrusion or spinal stenosis. Diffusion-weighted imaging of the cord negative for infarct. 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 Negative for intracranial hemorrhage or mass No acute abnormality in the cervical spine. Negative for spinal stenosis. Electronically Signed   By: Marlan Palau M.D.   On: 10/31/2015 21:11   Mr Lumbar Spine Wo Contrast  10/31/2015  CLINICAL DATA:  Leukocytosis and fever with perirectal abscess status post irrigation and drainage x2. Lower extremity weakness with difficulty ambulating. History of chronic kidney disease and diabetes. EXAM: MRI LUMBAR SPINE WITHOUT CONTRAST TECHNIQUE: Multiplanar, multisequence MR imaging of the lumbar spine was performed. No intravenous contrast was administered. COMPARISON:  Abdominal pelvic CT 10/29/2015 FINDINGS: Segmentation: Conventional anatomy assumed, with the last open disc space designated L5-S1. Alignment:  Normal. Vertebrae: There is  generalized decreased T1 and T2 marrow signal throughout the visualized bones which is attributed to chronic kidney disease. No acute osseous findings. No evidence of pars defect or discitis. Small erosions along the sacroiliac joints are likely related to the patient's renal disease and secondary hyperparathyroidism. No evidence of sacroiliac joint effusion to suggest infection. Conus medullaris: Extends to the L1 level and appears normal. No evidence of epidural fluid collection. Paraspinal and other soft tissues: There is extensive edema throughout the paraspinal soft tissues, subcutaneous fat and retroperitoneum. No focal fluid collections are seen to suggest infection. There is free pelvic fluid, similar to recent CT. Disc levels: All of the lumbar discs are well hydrated with maintained height. No evidence of disc herniation, spinal stenosis or nerve root encroachment. No significant facet arthropathy. IMPRESSION: 1. No evidence of spinal infection. Specifically, no evidence of discitis, facet joint effusion or epidural abscess. 2. No disc herniation, spinal stenosis or nerve root encroachment. 3. Extensive soft tissue edema throughout the subcutaneous, retroperitoneal and peritoneal soft tissues, consistent with anasarca, similar to recent CT. 4. Marrow and sacroiliac changes attributed to underlying renal disease. Electronically Signed   By: Carey Bullocks M.D.   On: 10/31/2015 11:30        Scheduled Meds: . ampicillin-sulbactam (UNASYN) IV  1.5 g Intravenous Q12H  . aspirin  325 mg Oral QHS  .  cholecalciferol  2,000 Units Oral Daily  . docusate sodium  100 mg Oral BID  . heparin  5,000 Units Subcutaneous Q8H  . insulin aspart  0-9 Units Subcutaneous TID WC  . insulin glargine  15 Units Subcutaneous QHS  . thiamine  100 mg Oral Daily   Continuous Infusions: . sodium chloride 75 mL/hr (11/01/15 1000)     LOS: 6 days    Time spent: 25 min    Penny Pia, DO [facility] Pager [phone]  If 7PM-7AM, please contact night-coverage [email or web address] Password [facility] 11/01/2015, 12:27 PM

## 2015-11-01 NOTE — Progress Notes (Signed)
NIF -60, VC 1.0L

## 2015-11-01 NOTE — Progress Notes (Signed)
VASCULAR LAB PRELIMINARY  PRELIMINARY  PRELIMINARY  PRELIMINARY  Bilateral upper extremity venous duplex completed.     Bilateral:  No evidence of DVT or superficial thrombosis.   Jenetta Loges, RVT, RDMS 11/01/2015, 4:37 PM

## 2015-11-01 NOTE — Consult Note (Signed)
CARDIOLOGY CONSULT NOTE   Patient ID: Shane Dougherty MRN: [id number] DOB/AGE: Nov 17, 1975 40 y.o.  Admit date: 10/25/2015  Requesting Physician: Dr. Cena Benton Primary Physician:   Jaclyn Shaggy, MD Primary Cardiologist:  New Reason for Consultation:  Chest pain   Shane Dougherty is a 40 y.o. male with a history of uncontrolled IDDM and CKD who presented to [facility] on 10/11/2015 with severe buttocks pain. He was found to have a peri-rectal abscess and transferred to [facility] for I&D. His hospital course has been complicated by SIRS, ARF, and acute CVA. Cardiology was consulted for elevated troponin and chest pain.   He has a 10 year history of uncontrolled IDDM. Most recent HgA1c was 14.6 which was improved from 16.8 about 7 months ago. He works as a Agricultural consultant. He lost his insurance due to a clerical issue and could not pay for his medications.   He was seen at [facility] on 10/22/15 at which time he had I&D of a small intergluteal abscess in the ED. Was placed on clindamycin and discharged from the ED.   Pain continued to worsen so returned to [facility] on 4/20.He was transferred to [facility] for surigcal I&D in the OR. He underwent surgical I&D on 4/21 by Dr. Sheliah Hatch and had a drain placed.He had a significantly elevated WBC count (peak 53.3) and was placed on vancomycin and zosyn.CT of the abdomen and pelvis on  4/23 revealed a focal 10 cm segment of thickened mid to distal small bowel without surrounding inflammation or obstruction. The question of Crohn's disease raised and GI consulted. They deferred further work up to outpatient setting after acute infection resolved.   Neurology was then consulted on 4/25 for new onset bilateral LE weakness and areflexia following surgery. Brain MR with multiple small foci of restricted diffusion in both cerebral hemispheres most consistent with embolic infarctions.  Nephrology also consulted 4/25 for acute on chronic kidney injury.  Creat up from 1.27 (08/29/15) to 4.54 on 10/30/15. This was felt to be ATN from hypoperfusion 2/2 sepsis. Given IV fluids with improvement. Today 3.63.  2D ECHO 10/30/15 with normal LV function (EF 55-60%), G2DD, PA pressure 46. Small extracardiac pericardial effusion w/ respiratory variation across MV and IVC dilated; left pleural effusion.  Cardiology was consulted due to chest pain and elevated troponin. He had chest pain last night that went from his left breast to his navel and back. It was associated with SOB and diaphoresis. No nausea. He currently has chest tightness that is better when lying flat. No LE edema, orthopnea or PND. He still feels like he cannot use his legs well. Prior to this he never had any chest pain.      Past Medical History  Diagnosis Date  . Diabetes mellitus without complication (HCC)   . Bell's palsy 08/2014  . CKD (chronic kidney disease)      Past Surgical History  Procedure Laterality Date  . Incise and drain abcess  11/01/2015    GLUTEAL  . Incision and drainage abscess Left 11/03/2015    Procedure: INCISION AND DRAINAGE ABSCESS/GLUTEAL;  Surgeon: De Blanch Kinsinger, MD;  Location: [facility] OR;  Service: General;  Laterality: Left;    Allergies  Allergen Reactions  . Bee Venom Swelling    Throat involvement    I have reviewed the patient's current medications . ampicillin-sulbactam (UNASYN) IV  1.5 g Intravenous Q12H  . aspirin  325 mg Oral QHS  .  cholecalciferol  2,000 Units Oral Daily  . docusate sodium  100 mg Oral BID  . heparin  5,000 Units Subcutaneous Q8H  . insulin aspart  0-9 Units Subcutaneous TID WC  . insulin glargine  15 Units Subcutaneous QHS  . thiamine  100 mg Oral Daily   . sodium chloride 75 mL/hr (11/01/15 1000)   HYDROcodone-acetaminophen, ondansetron **OR** ondansetron (ZOFRAN) IV, senna-docusate  Prior to Admission medications   Medication Sig Start Date End Date Taking? Authorizing Provider  acetaminophen (TYLENOL) 500 MG  tablet Take 500-1,000 mg by mouth every 6 (six) hours as needed for mild pain or moderate pain.   Yes Historical Provider, MD  clindamycin (CLEOCIN) 300 MG capsule Take 1 capsule (300 mg total) by mouth 4 (four) times daily. X 7 days 10/22/15  Yes Mancel Bale, MD  HYDROcodone-acetaminophen (NORCO) 5-325 MG tablet Take 1-2 tablets by mouth every 4 (four) hours as needed. Patient taking differently: Take 1-2 tablets by mouth every 4 (four) hours as needed for moderate pain.  10/22/15  Yes Mancel Bale, MD  insulin glargine (LANTUS) 100 UNIT/ML injection Inject 0.25 mLs (25 Units total) into the skin at bedtime. 08/11/15  Yes Jaclyn Shaggy, MD  insulin lispro (HUMALOG) 100 UNIT/ML injection Inject 0.1 mLs (10 Units total) into the skin 3 (three) times daily with meals. Your sliding scale with meals. 08/22/15  Yes Jaclyn Shaggy, MD  polyvinyl alcohol (LIQUIFILM TEARS) 1.4 % ophthalmic solution Place 3-4 drops into both eyes as needed for dry eyes.   Yes Historical Provider, MD     Social History   Social History  . Marital Status: Single    Spouse Name: N/A  . Number of Children: N/A  . Years of Education: N/A   Occupational History  . Not on file.   Social History Main Topics  . Smoking status: Passive Smoke Exposure - Never Smoker  . Smokeless tobacco: Never Used  . Alcohol Use: Yes     Comment: occasiONAL  . Drug Use: No  . Sexual Activity: Not on file   Other Topics Concern  . Not on file   Social History Narrative    Family Status  Relation Status Death Age  . Mother Deceased   . Father Deceased    Family History  Problem Relation Age of Onset  . CAD Mother      ROS:  Full 14 point review of systems complete and found to be negative unless listed above.  Physical Exam: Blood pressure 131/72, pulse 92, temperature 98.6 F (37 C), temperature source Oral, resp. rate 11, height 5\' 7"  (1.702 m), weight 148 lb (67.132 kg), SpO2 98 %.  General: Well developed, well nourished,  male in no acute distress Head: Eyes PERRLA, No xanthomas.   Normocephalic and atraumatic, oropharynx without edema or exudate.   Lungs: didn't listen to lungs as he was having his carotid dopplers done.  Heart: HRRR S1 S2, no rub/gallop, Heart regular rate and rhythm with S1, S2  murmur. pulses are 2+ extrem.   Neck: No carotid bruits. No lymphadenopathy. No JVD. Abdomen: Bowel sounds present, abdomen soft and non-tender without masses or hernias noted. Msk:  No spine or cva tenderness. No weakness, no joint deformities or effusions. Extremities: No clubbing or cyanosis. 1+ LE bilateral  edema.  Neuro: Alert and oriented X 3. No focal deficits noted. Psych:  Good affect, responds appropriately Skin: No rashes or lesions noted.  Labs:   Lab Results  Component Value Date  WBC 50.6* 11/01/2015   HGB 8.6* 11/01/2015   HCT 25.2* 11/01/2015   MCV 77.5* 11/01/2015   PLT 482* 11/01/2015   No results for input(s): INR in the last 72 hours.  Recent Labs Lab 10/29/15 0311  11/01/15 0538  NA 135  < > 130*  K 3.5  < > 3.0*  CL 105  < > 102  CO2 15*  < > 15*  BUN 38*  < > 47*  CREATININE 3.88*  < > 3.63*  CALCIUM 7.2*  < > 7.1*  PROT 5.2*  --   --   BILITOT 1.0  --   --   ALKPHOS 521*  --   --   ALT 8*  --   --   AST 30  --   --   GLUCOSE 108*  < > 203*  ALBUMIN 1.0*  < > <1.0*  < > = values in this interval not displayed. No results found for: MG  Recent Labs  10/31/15 1855 10/31/15 2244 11/01/15 0538  TROPONINI 0.08* 0.20* 0.32*    LIPASE  Date/Time Value Ref Range Status  08/29/2015 07:29 PM 39 11 - 51 U/L Final   TSH  Date/Time Value Ref Range Status  04/26/2015 12:02 PM 2.940 0.350 - 4.500 uIU/mL Final    2D Echo:  10/30/2015 Study Conclusions - Left ventricle: The cavity size was normal. Wall thickness was  normal. Systolic function was normal. The estimated ejection  fraction was in the range of 55% to 60%. Wall motion was normal;  there were no  regional wall motion abnormalities. Features are  consistent with a pseudonormal left ventricular filling pattern,  with concomitant abnormal relaxation and increased filling  pressure (grade 2 diastolic dysfunction). - Pulmonary arteries: Systolic pressure was mildly increased. PA  peak pressure: 46 mm Hg (S). - Pericardium, extracardiac: A small pericardial effusion was  identified. There was a left pleural effusion. Impressions: - Normal LV systolic function; grade 2 diastolic dysfunction; mild  TR; mildly elevated pulmonary pressure; small pericardial  effusion; respiratory variation across MV and IVC dilated; left  pleural effusion.  ECG:  NSR HR 96 cannot rule out prior anteroseptal infarct.  Radiology:  Mr Shane Dougherty Contrast  10/31/2015  CLINICAL DATA:  Recent drainage of left gluteal abscess. Now with leg weakness. Diabetes EXAM: MRI HEAD WITHOUT CONTRAST MRI CERVICAL SPINE WITHOUT CONTRAST TECHNIQUE: Multiplanar, multiecho pulse sequences of the brain and surrounding structures, and cervical spine, to include the craniocervical junction and cervicothoracic junction, were obtained without intravenous contrast. COMPARISON:  MRI 08/20/2014 FINDINGS: MRI HEAD FINDINGS Multiple small foci of restricted diffusion in the cerebral hemispheres bilaterally. These are present in the posterior temporal lobe bilaterally, the left parietal periventricular white matter, frontal lobes bilaterally. Sparing of the brainstem and cerebellum. 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 Ventricle size normal.  Cerebral volume normal. Negative for intracranial hemorrhage. Negative for mass or edema. No shift of the midline structures. Mucosal thickening throughout the paranasal sinuses. Mastoid sinus effusion bilaterally. Pituitary and skull base normal.  Circle Willis patent. MRI CERVICAL SPINE FINDINGS Image quality degraded by motion. Straightening of the  cervical lordosis with mild kyphosis. Normal alignment. Negative for fracture or mass Spinal cord signal is normal.  No cord compression or cord lesion. Mild cervical degenerative change. No disc protrusion or spinal stenosis. Diffusion-weighted imaging of the cord negative for infarct. IMPRESSION: Multiple small foci of restricted diffusion in both cerebral hemispheres  most consistent with embolic infarctions. Negative for intracranial hemorrhage or mass No acute abnormality in the cervical spine. Negative for spinal stenosis. Electronically Signed   By: Marlan Palau M.D.   On: 10/31/2015 21:11   Mr Cervical Spine Wo Contrast  10/31/2015  CLINICAL DATA:  Recent drainage of left gluteal abscess. Now with leg weakness. Diabetes EXAM: MRI HEAD WITHOUT CONTRAST MRI CERVICAL SPINE WITHOUT CONTRAST TECHNIQUE: Multiplanar, multiecho pulse sequences of the brain and surrounding structures, and cervical spine, to include the craniocervical junction and cervicothoracic junction, were obtained without intravenous contrast. COMPARISON:  MRI 08/20/2014 FINDINGS: MRI HEAD FINDINGS Multiple small foci of restricted diffusion in the cerebral hemispheres bilaterally. These are present in the posterior temporal lobe bilaterally, the left parietal periventricular white matter, frontal lobes bilaterally. Sparing of the brainstem and cerebellum. 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 Ventricle size normal.  Cerebral volume normal. Negative for intracranial hemorrhage. Negative for mass or edema. No shift of the midline structures. Mucosal thickening throughout the paranasal sinuses. Mastoid sinus effusion bilaterally. Pituitary and skull base normal.  Circle Willis patent. MRI CERVICAL SPINE FINDINGS Image quality degraded by motion. Straightening of the cervical lordosis with mild kyphosis. Normal alignment. Negative for fracture or mass Spinal cord signal is normal.  No cord  compression or cord lesion. Mild cervical degenerative change. No disc protrusion or spinal stenosis. Diffusion-weighted imaging of the cord negative for infarct. 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 Negative for intracranial hemorrhage or mass No acute abnormality in the cervical spine. Negative for spinal stenosis. Electronically Signed   By: Marlan Palau M.D.   On: 10/31/2015 21:11   Mr Lumbar Spine Wo Contrast  10/31/2015  CLINICAL DATA:  Leukocytosis and fever with perirectal abscess status post irrigation and drainage x2. Lower extremity weakness with difficulty ambulating. History of chronic kidney disease and diabetes. EXAM: MRI LUMBAR SPINE WITHOUT CONTRAST TECHNIQUE: Multiplanar, multisequence MR imaging of the lumbar spine was performed. No intravenous contrast was administered. COMPARISON:  Abdominal pelvic CT 10/29/2015 FINDINGS: Segmentation: Conventional anatomy assumed, with the last open disc space designated L5-S1. Alignment:  Normal. Vertebrae: There is generalized decreased T1 and T2 marrow signal throughout the visualized bones which is attributed to chronic kidney disease. No acute osseous findings. No evidence of pars defect or discitis. Small erosions along the sacroiliac joints are likely related to the patient's renal disease and secondary hyperparathyroidism. No evidence of sacroiliac joint effusion to suggest infection. Conus medullaris: Extends to the L1 level and appears normal. No evidence of epidural fluid collection. Paraspinal and other soft tissues: There is extensive edema throughout the paraspinal soft tissues, subcutaneous fat and retroperitoneum. No focal fluid collections are seen to suggest infection. There is free pelvic fluid, similar to recent CT. Disc levels: All of the lumbar discs are well hydrated with maintained height. No evidence of disc herniation, spinal stenosis or  nerve root encroachment. No significant facet arthropathy. IMPRESSION: 1. No evidence of spinal infection. Specifically, no evidence of discitis, facet joint effusion or epidural abscess. 2. No disc herniation, spinal stenosis or nerve root encroachment. 3. Extensive soft tissue edema throughout the subcutaneous, retroperitoneal and peritoneal soft tissues, consistent with anasarca, similar to recent CT. 4. Marrow and sacroiliac changes attributed to underlying renal disease. Electronically Signed   By: Carey Bullocks M.D.   On: 10/31/2015 11:30    ASSESSMENT AND PLAN:  Active Problems:   Hyperglycemia due to type 2 diabetes mellitus (HCC)   Hyponatremia   Essential hypertension   Diabetic neuropathy, type II diabetes mellitus (HCC)   Peri-rectal abscess   CKD (chronic kidney disease)   Leukocytosis   AKI (acute kidney injury) (HCC)   Pressure ulcer  Shane Dougherty is a 40 y.o. male with a history of uncontrolled IDDM and CKD who presented to [facility] on 10/31/2015 with severe buttocks pain. He was found to have a peri-rectal abscess and transferred to [facility] for I&D. His hospital course has been complicated by SIRS, ARF, and acute CVA. Cardiology was consulted for elevated troponin and chest pain.    Chest pain/Elevated troponin: troponin 0.08--> 0.20--> 0.32. Unclear if troponin elevation is demand in the setting of SIRS and acute kidney injury or truly ACS. 2D ECHO with normal EF and no RWMAs.  -- Continue ASA for now.  May consider heparin if cleared from neuro standpoint.   Pericardial effusion: small on 2D ECHO. No signs of tamponade. Continue to monitor.   Peri-rectal abscess s/p I&D: per IM. Blood cultures NGTD. HIV negative.   AKI: creat improving on IV fluids.   Acute CVA: neuro was then consulted on 4/25 for new onset bilateral LE weakness and areflexia following surgery. Brain MR with multiple small foci of restricted diffusion in both cerebral hemispheres most consistent with embolic  infarctions. Consider TEE to rule out endocarditis.   Uncontrolled IDDM: per IM. HgA1c 14.6 down from 16.8 ~ 7 months ago.   Severe protein calorie malnutrition: albumin 1. Leading to significant 3rd spacing.   SignedJanetta Hora, PA-C 11/01/2015 12:07 PM  Pager [phone]  Co-Sign MD

## 2015-11-01 NOTE — Progress Notes (Signed)
Patient complained of severe Chest Pain/ Pressure. MD aware. New orders obtained and patient medicated for pain.

## 2015-11-01 NOTE — Progress Notes (Signed)
[facility] has been requested to perform a transesophageal echocardiogram on 11/03/2015 for this patient.  After careful review of history and examination, the risks and benefits of transesophageal echocardiogram have been explained including risks of esophageal damage, perforation (1:10,000 risk), bleeding, pharyngeal hematoma as well as other potential complications associated with conscious sedation including aspiration, arrhythmia, respiratory failure and death. Alternatives to treatment were discussed, questions were answered. Patient is willing to proceed.  TEE - Dr. Anne Fu @ 1400 . NPO after midnight. Meds with sips.   Shane Ishikawa, NP 11/01/2015 3:49 PM

## 2015-11-01 NOTE — Progress Notes (Addendum)
NIF -40, VC 700 ml w/ ? Results d/t pt not able to keep/maintain a good mouth seal while exhaling for VC.

## 2015-11-01 NOTE — Progress Notes (Signed)
ANTICOAGULATION CONSULT NOTE - Initial Consult  Pharmacy Consult for Heparin Indication: chest pain/ACS  Allergies  Allergen Reactions  . Bee Venom Swelling    Throat involvement    Patient Measurements: Height:  (170.2 cm) Weight: 148 lb (67.132 kg) IBW/kg (Calculated) : 66.1 Heparin Dosing Weight: 67 kg  Vital Signs: Temp: 98.6 F (37 C) (04/26 0754) Temp Source: Oral (04/26 0754) BP: 131/72 mmHg (04/26 1000) Pulse Rate: 92 (04/26 1000)  Labs:  Recent Labs  10/30/15 0603  10/30/15 0913 10/31/15 0349  10/31/15 2244 11/01/15 0538 11/01/15 1331  HGB  --   < > 8.3* 8.5*  --   --  8.6*  --   HCT  --   --  25.9* 24.9*  --   --  25.2*  --   PLT  --   --  438* 445*  --   --  482*  --   CREATININE 4.57*  --   --  4.17*  --   --  3.63*  --   TROPONINI  --   --   --   --   < > 0.20* 0.32* 0.54*  < > = values in this interval not displayed.  Estimated Creatinine Clearance: 25.3 mL/min (by C-G formula based on Cr of 3.63).   Medical History: Past Medical History  Diagnosis Date  . Diabetes mellitus without complication (HCC)   . Bell's palsy 08/2014  . CKD (chronic kidney disease)     Medications:  Prescriptions prior to admission  Medication Sig Dispense Refill Last Dose  . acetaminophen (TYLENOL) 500 MG tablet Take 500-1,000 mg by mouth every 6 (six) hours as needed for mild pain or moderate pain.   Past Month at Unknown time  . clindamycin (CLEOCIN) 300 MG capsule Take 1 capsule (300 mg total) by mouth 4 (four) times daily. X 7 days 28 capsule 0 10/25/2015 at Unknown time  . HYDROcodone-acetaminophen (NORCO) 5-325 MG tablet Take 1-2 tablets by mouth every 4 (four) hours as needed. (Patient taking differently: Take 1-2 tablets by mouth every 4 (four) hours as needed for moderate pain. ) 20 tablet 0 Past Week at Unknown time  . insulin glargine (LANTUS) 100 UNIT/ML injection Inject 0.25 mLs (25 Units total) into the skin at bedtime. 10 mL 2 10/25/2015 at Unknown  time  . insulin lispro (HUMALOG) 100 UNIT/ML injection Inject 0.1 mLs (10 Units total) into the skin 3 (three) times daily with meals. Your sliding scale with meals. 10 mL 2 10/25/2015 at Unknown time  . polyvinyl alcohol (LIQUIFILM TEARS) 1.4 % ophthalmic solution Place 3-4 drops into both eyes as needed for dry eyes.   Past Month at Unknown time   Scheduled:  . ampicillin-sulbactam (UNASYN) IV  1.5 g Intravenous Q12H  . aspirin  325 mg Oral QHS  . atorvastatin  80 mg Oral q1800  . cholecalciferol  2,000 Units Oral Daily  . docusate sodium  100 mg Oral BID  . insulin aspart  0-9 Units Subcutaneous TID WC  . insulin glargine  15 Units Subcutaneous QHS  . thiamine  100 mg Oral Daily   Infusions:    Assessment: 40yo male here for surgical debridement of abscess complicated by an AKI and stroke has now developed CP. Pharmacy is consulted to dose heparin for ACS/chest pain. Trop 0.08>0.2>0.32>0.54. Pt received subcutaneous heparin at 1500.    Goal of Therapy:  Heparin level 0.3-0.5 units/ml Monitor platelets by anticoagulation protocol: Yes   Plan:  Start heparin  infusion at 850 units/hr Check anti-Xa level in 8 hours and daily while on heparin Continue to monitor H&H and platelets  Arlean Hopping. Newman Pies, PharmD, BCPS Clinical Pharmacist Pager [phone] 11/01/2015,3:54 PM

## 2015-11-01 NOTE — Progress Notes (Signed)
STROKE TEAM PROGRESS NOTE   HISTORY OF PRESENT ILLNESS Shane Dougherty is an 40 y.o. male who presented to the hospital for I&D of left gluteal abscess. Patient und went surgery on Thursday. He states since the surgery he has been unable to move his legs. Prior to hospitalization he was able to walk with a cane. He feels his weakness has slowly become worse and now he is having difficulty lifting his arms and feels he was having difficulty coughing up phlegm. He has since had a urinary catheter placed due to inability to urinate and urine retention. Post op he noted his feet felt like they were "in ICE" and now his hands feel that way. Denies any recent colds or vaccinations. Patient was not given IV t-PA.    SUBJECTIVE (INTERVAL HISTORY) His RN is at the bedside.  Overall he feels his condition is gradually worsening. He stated that his left leg weakness worsened and now not able to move. He still has right leg not able to move and b/l UE weakness too.    OBJECTIVE Temp:  [98.4 F (36.9 C)-98.6 F (37 C)] 98.6 F (37 C) (04/26 0754) Pulse Rate:  [76-98] 78 (04/26 0754) Cardiac Rhythm:  [-] Normal sinus rhythm (04/26 0812) Resp:  [11-20] 11 (04/26 0754) BP: (102-131)/(67-81) 103/67 mmHg (04/26 0754) SpO2:  [96 %-100 %] 99 % (04/26 0754)  CBC:  Recent Labs Lab 11/03/2015 1318  10/31/15 0349 11/01/15 0538  WBC 22.8*  < > 53.3* 50.6*  NEUTROABS 20.7*  --   --  47.6*  HGB 10.6*  < > 8.5* 8.6*  HCT 31.6*  < > 24.9* 25.2*  MCV 78.6  < > 78.5 77.5*  PLT 453*  < > 445* 482*  < > = values in this interval not displayed.  Basic Metabolic Panel:  Recent Labs Lab 10/31/15 0349 11/01/15 0538  NA 132* 130*  K 3.3* 3.0*  CL 105 102  CO2 14* 15*  GLUCOSE 101* 203*  BUN 45* 47*  CREATININE 4.17* 3.63*  CALCIUM 7.1* 7.1*  PHOS 6.6* 6.0*    Lipid Panel: No results found for: CHOL, TRIG, HDL, CHOLHDL, VLDL, LDLCALC HgbA1c:  Lab Results  Component Value Date   HGBA1C 14.6* 10/22/2015    Urine Drug Screen: No results found for: LABOPIA, COCAINSCRNUR, LABBENZ, AMPHETMU, THCU, LABBARB    IMAGING I have personally reviewed the radiological images below and agree with the radiology interpretations.  Mr Brain Wo Contrast 10/31/2015  Multiple small foci of restricted diffusion in both cerebral hemispheres most consistent with embolic infarctions. Negative for intracranial hemorrhage or mass   Mr Cervical Spine Wo Contrast 10/31/2015  No acute abnormality in the cervical spine. Negative for spinal stenosis.   Mr Lumbar Spine Wo Contrast 10/31/2015  1. No evidence of spinal infection. Specifically, no evidence of discitis, facet joint effusion or epidural abscess. 2. No disc herniation, spinal stenosis or nerve root encroachment. 3. Extensive soft tissue edema throughout the subcutaneous, retroperitoneal and peritoneal soft tissues, consistent with anasarca, similar to recent CT. 4. Marrow and sacroiliac changes attributed to underlying renal disease.   Mr Thoracic Spine Wo Contrast  11/01/2015 IMPRESSION: Normal-appearing thoracic spine. Negative for evidence of infection. Small moderate bilateral pleural effusions.   2D echo - Left ventricle: The cavity size was normal. Wall thickness was  normal. Systolic function was normal. The estimated ejection  fraction was in the range of 55% to 60%. Wall motion was normal;  there were no regional  wall motion abnormalities. Features are  consistent with a pseudonormal left ventricular filling pattern,  with concomitant abnormal relaxation and increased filling  pressure (grade 2 diastolic dysfunction). - Pulmonary arteries: Systolic pressure was mildly increased. PA  peak pressure: 46 mm Hg (S). - Pericardium, extracardiac: A small pericardial effusion was  identified. There was a left pleural effusion. Impressions: - Normal LV systolic function; grade 2 diastolic dysfunction; mild  TR; mildly elevated pulmonary pressure;  small pericardial  effusion; respiratory variation across MV and IVC dilated; left  pleural effusion.  CUS pending  Venous doppler - No evidence of DVT, superficial thrombosis, or Baker's Cyst.   PHYSICAL EXAM  Temp:  [98.5 F (36.9 C)-98.6 F (37 C)] 98.6 F (37 C) (04/26 0754) Pulse Rate:  [76-98] 92 (04/26 1000) Resp:  [11-20] 11 (04/26 0800) BP: (103-131)/(66-81) 131/72 mmHg (04/26 1000) SpO2:  [97 %-100 %] 98 % (04/26 1000)  General - Well nourished, well developed, in mild respiratory distress.  Ophthalmologic - Fundi not visualized due to noncooperation.  Cardiovascular - Regular rate and rhythm.  Mental Status -  Level of arousal and orientation to time, place, and person were intact. Language including expression, naming, repetition, comprehension was assessed and found intact. Fund of Knowledge was assessed and was intact.  Cranial Nerves II - XII - II - Visual field intact OU. III, IV, VI - Extraocular movements intact. V - Facial sensation intact bilaterally. VII - Facial movement intact bilaterally. VIII - Hearing & vestibular intact bilaterally. X - Palate elevates symmetrically. XI - Chin turning & shoulder shrug intact bilaterally. XII - Tongue protrusion intact.  Motor Strength - The patient's strength was 4-/5 BUEs proximal and distrally, 1/5 BLEs proximal and 0/5 distally. No knee extension or flexion and no DF/PF bilaterally. Bulk was normal and fasciculations were absent.   Motor Tone - Muscle tone was assessed at the neck and appendages and was decreased bilaterally.  Reflexes - The patient's reflexes were diminished in all extremities and he had no pathological reflexes.  Sensory - BUEs: light touch, temperature/pinprick symmetrical but decreased but not diminished, distal worsen than proximal, vibration and proprioception symmetrical. BLEs: light touch, temperature/pinprick decreased but not diminished, distal worsen than proximal, vibration and  proprioception symmetrical in knees but diminished in ankles and toes.    Coordination - The patient had normal movements in the hands with no ataxia or dysmetria, but slow.  Tremor was absent.  Gait and Station - not able to teste due to weakness   ASSESSMENT/PLAN Mr. Shane Dougherty is a 40 y.o. male with history of uncontrolled DM, CKD, walk with cane at home, underwent I&D of peri-rectal abscess consulted for quadriparesis after surgery. He did not receive IV t-PA due to out of window.   Stroke:  bilateral anterior circulation punctate infarct, embolic secondary to unknown source  Resultant  quadiparesis  MRI  Bilateral anterior circulation punctate infact  MRA  Not ordered  Carotid Doppler  pending  2D Echo  unremarkable  LDL pending  HgbA1c 14.6  Heparin Q8 subq for VTE prophylaxis  Recommend TEE to rule out endocarditis in the setting of severe infection and super high WBC  No antithrombotic prior to admission, now on aspirin 325 mg daily  Patient counseled to be compliant with his antithrombotic medications  Ongoing aggressive stroke risk factor management  Therapy recommendations:  CIR  Disposition:  Pending  Quadriparesis - ? ASA infarct vs. ? GBS vs. ? Transverse myelitis   Concerning for  anterior spinal artery infarct given embolic infarcts at anterior circulation and non-progressive pattern - however, MRI C/T spine no spinal cord signal changes, LE vibration and proprioception loss (could be DM neuropathy).   ? GBS due to diminished DTR (could be due to DM neuropathy) - however, sensory loss more prominent than GBS and non-progressive in nature - LP contraindicated due to nearby infection and also CSF likely unrevealing as DM pt also has high protein in CSF - ideally EMG/NCS may provide certain information  ? Transverse myelitis - However, MRI C/T negative - not ideal candidate for steriods due to uncontrolled DM  Continue NIF and VC monitoring   Severe  leukocytosis  WBC 53.3->50.6  Elevated troponin  Cardiology on board  Pt does have increased SOB  Does have CKD with Cre 3.63, but improving numbers down from 4.17 yesterday  BP management  Stable Permissive hypertension (OK if < 220/120) but gradually normalize in 5-7 days  Hyperlipidemia  Home meds:  none  LDL pending, goal < 70  Diabetes  HgbA1c 14.6, goal < 7.0  Uncontrolled  On lantus  SSI  CBG monitoring  Other Stroke Risk Factors  Passive smoker  Other Active Problems  Hx Bell's Palsy  acute on CKD  Anemia  Abscess - prei-rectal - surgery on board - on unasyn  Diarrhea   Hospital day # 6  This patient is critically ill due to quadriparesis, emoblic stroke, elevated troponin, CKD, severe leukocytosis, uncontrolled DM, peri-rectal abscess and at significant risk of neurological worsening, death form respiratory failure, heart failure, increased paralysis, kidney failure, MI and recurrent stroke. This patient's care requires constant monitoring of vital signs, hemodynamics, respiratory and cardiac monitoring, review of multiple databases, neurological assessment, discussion with family, other specialists and medical decision making of high complexity. I spent 55 minutes of neurocritical care time in the care of this patient.  Marvel Plan, MD PhD Stroke Neurology 11/01/2015 2:52 PM   To contact Stroke Continuity provider, please refer to [email or web address]. After hours, contact General Neurology

## 2015-11-01 NOTE — Progress Notes (Addendum)
Lab here to draw pt - had to reschedule all labs for 1400 since he went down to complete thoracic segment of MRI

## 2015-11-01 NOTE — Progress Notes (Signed)
5 Days Post-Op  Subjective: Sleepy  MRI results reviewed   Denies abdominal pain  Denies buttock pain    Objective: Vital signs in last 24 hours: Temp:  [97.7 F (36.5 C)-98.6 F (37 C)] 98.6 F (37 C) (04/26 0754) Pulse Rate:  [76-98] 78 (04/26 0754) Resp:  [10-20] 11 (04/26 0754) BP: (102-131)/(61-81) 103/67 mmHg (04/26 0754) SpO2:  [96 %-100 %] 99 % (04/26 0754) Last BM Date: 10/31/15  Intake/Output from previous day: 04/25 0701 - 04/26 0700 In: 791.7 [P.O.:720; I.V.:71.7] Out: 1100 [Urine:1100] Intake/Output this shift: Total I/O In: -  Out: 250 [Urine:250]  GI: soft no rebound or guarding Incision/Wound:perineum with penrose in place small adjacent ulceration noted unchanged  Subcutaneous air   No abscess  Clear drainage   Lab Results:   Recent Labs  10/31/15 0349 11/01/15 0538  WBC 53.3* 50.6*  HGB 8.5* 8.6*  HCT 24.9* 25.2*  PLT 445* 482*   BMET  Recent Labs  10/31/15 0349 11/01/15 0538  NA 132* 130*  K 3.3* 3.0*  CL 105 102  CO2 14* 15*  GLUCOSE 101* 203*  BUN 45* 47*  CREATININE 4.17* 3.63*  CALCIUM 7.1* 7.1*   PT/INR No results for input(s): LABPROT, INR in the last 72 hours. ABG No results for input(s): PHART, HCO3 in the last 72 hours.  Invalid input(s): PCO2, PO2  Studies/Results: Mr Brain Wo Contrast  10/31/2015  CLINICAL DATA:  Recent drainage of left gluteal abscess. Now with leg weakness. Diabetes EXAM: MRI HEAD WITHOUT CONTRAST MRI CERVICAL SPINE WITHOUT CONTRAST TECHNIQUE: Multiplanar, multiecho pulse sequences of the brain and surrounding structures, and cervical spine, to include the craniocervical junction and cervicothoracic junction, were obtained without intravenous contrast. COMPARISON:  MRI 08/20/2014 FINDINGS: MRI HEAD FINDINGS Multiple small foci of restricted diffusion in the cerebral hemispheres bilaterally. These are present in the posterior temporal lobe bilaterally, the left parietal periventricular white  matter, frontal lobes bilaterally. Sparing of the brainstem and cerebellum. 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 Ventricle size normal.  Cerebral volume normal. Negative for intracranial hemorrhage. Negative for mass or edema. No shift of the midline structures. Mucosal thickening throughout the paranasal sinuses. Mastoid sinus effusion bilaterally. Pituitary and skull base normal.  Circle Willis patent. MRI CERVICAL SPINE FINDINGS Image quality degraded by motion. Straightening of the cervical lordosis with mild kyphosis. Normal alignment. Negative for fracture or mass Spinal cord signal is normal.  No cord compression or cord lesion. Mild cervical degenerative change. No disc protrusion or spinal stenosis. Diffusion-weighted imaging of the cord negative for infarct. 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 Negative for intracranial hemorrhage or mass No acute abnormality in the cervical spine. Negative for spinal stenosis. Electronically Signed   By: Marlan Palau M.D.   On: 10/31/2015 21:11   Mr Cervical Spine Wo Contrast  10/31/2015  CLINICAL DATA:  Recent drainage of left gluteal abscess. Now with leg weakness. Diabetes EXAM: MRI HEAD WITHOUT CONTRAST MRI CERVICAL SPINE WITHOUT CONTRAST TECHNIQUE: Multiplanar, multiecho pulse sequences of the brain and surrounding structures, and cervical spine, to include the craniocervical junction and cervicothoracic junction, were obtained without intravenous contrast. COMPARISON:  MRI 08/20/2014 FINDINGS: MRI HEAD FINDINGS Multiple small foci of restricted diffusion in the cerebral hemispheres bilaterally. These are present in the posterior temporal lobe bilaterally, the left parietal periventricular white matter, frontal lobes bilaterally. Sparing of the brainstem and cerebellum. These  are most consistent with acute infarcts in  the bilateral MCA and left ACA territory. Ventricle size normal.  Cerebral volume normal. Negative for intracranial hemorrhage. Negative for mass or edema. No shift of the midline structures. Mucosal thickening throughout the paranasal sinuses. Mastoid sinus effusion bilaterally. Pituitary and skull base normal.  Circle Willis patent. MRI CERVICAL SPINE FINDINGS Image quality degraded by motion. Straightening of the cervical lordosis with mild kyphosis. Normal alignment. Negative for fracture or mass Spinal cord signal is normal.  No cord compression or cord lesion. Mild cervical degenerative change. No disc protrusion or spinal stenosis. Diffusion-weighted imaging of the cord negative for infarct. 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 Negative for intracranial hemorrhage or mass No acute abnormality in the cervical spine. Negative for spinal stenosis. Electronically Signed   By: Marlan Palau M.D.   On: 10/31/2015 21:11   Mr Lumbar Spine Wo Contrast  10/31/2015  CLINICAL DATA:  Leukocytosis and fever with perirectal abscess status post irrigation and drainage x2. Lower extremity weakness with difficulty ambulating. History of chronic kidney disease and diabetes. EXAM: MRI LUMBAR SPINE WITHOUT CONTRAST TECHNIQUE: Multiplanar, multisequence MR imaging of the lumbar spine was performed. No intravenous contrast was administered. COMPARISON:  Abdominal pelvic CT 10/29/2015 FINDINGS: Segmentation: Conventional anatomy assumed, with the last open disc space designated L5-S1. Alignment:  Normal. Vertebrae: There is generalized decreased T1 and T2 marrow signal throughout the visualized bones which is attributed to chronic kidney disease. No acute osseous findings. No evidence of pars defect or discitis. Small erosions along the sacroiliac joints are likely related to the patient's renal disease and secondary hyperparathyroidism. No  evidence of sacroiliac joint effusion to suggest infection. Conus medullaris: Extends to the L1 level and appears normal. No evidence of epidural fluid collection. Paraspinal and other soft tissues: There is extensive edema throughout the paraspinal soft tissues, subcutaneous fat and retroperitoneum. No focal fluid collections are seen to suggest infection. There is free pelvic fluid, similar to recent CT. Disc levels: All of the lumbar discs are well hydrated with maintained height. No evidence of disc herniation, spinal stenosis or nerve root encroachment. No significant facet arthropathy. IMPRESSION: 1. No evidence of spinal infection. Specifically, no evidence of discitis, facet joint effusion or epidural abscess. 2. No disc herniation, spinal stenosis or nerve root encroachment. 3. Extensive soft tissue edema throughout the subcutaneous, retroperitoneal and peritoneal soft tissues, consistent with anasarca, similar to recent CT. 4. Marrow and sacroiliac changes attributed to underlying renal disease. Electronically Signed   By: Carey Bullocks M.D.   On: 10/31/2015 11:30    Anti-infectives: Anti-infectives    Start     Dose/Rate Route Frequency Ordered Stop   10/31/15 1300  ampicillin-sulbactam (UNASYN) 1.5 g in sodium chloride 0.9 % 50 mL IVPB     1.5 g 100 mL/hr over 30 Minutes Intravenous Every 12 hours 10/31/15 1214     10/29/15 2000  metroNIDAZOLE (FLAGYL) IVPB 500 mg  Status:  Discontinued     500 mg 100 mL/hr over 60 Minutes Intravenous Every 8 hours 10/29/15 1852 10/30/15 1312   10/29/15 0800  vancomycin (VANCOCIN) IVPB 750 mg/150 ml premix  Status:  Discontinued     750 mg 150 mL/hr over 60 Minutes Intravenous Every 24 hours 10/28/15 1058 10/31/15 0750   11/17/2015 0800  vancomycin (VANCOCIN) 500 mg in sodium chloride 0.9 % 100 mL IVPB  Status:  Discontinued     500 mg 100  mL/hr over 60 Minutes Intravenous Every 12 hours 10/27/2015 1931 10/28/15 1058   11/04/2015 0200   piperacillin-tazobactam (ZOSYN) IVPB 3.375 g  Status:  Discontinued     3.375 g 12.5 mL/hr over 240 Minutes Intravenous Every 8 hours 10/13/2015 1938 10/31/15 1205   10/20/2015 2000  piperacillin-tazobactam (ZOSYN) IVPB 3.375 g     3.375 g 100 mL/hr over 30 Minutes Intravenous  Once 10/10/2015 1903 10/10/2015 2117   10/10/2015 2000  vancomycin (VANCOCIN) IVPB 1000 mg/200 mL premix     1,000 mg 200 mL/hr over 60 Minutes Intravenous  Once 10/25/2015 1903 10/11/2015 2147      Assessment/Plan: s/p Procedure(s): INCISION AND DRAINAGE ABSCESS/GLUTEAL (Left) Patient Active Problem List   Diagnosis Date Noted  . Pressure ulcer 10/31/2015  . AKI (acute kidney injury) (HCC) 10/29/2015  . Peri-rectal abscess 10/13/2015  . CKD (chronic kidney disease) 10/18/2015  . Leukocytosis 10/14/2015  . Loss of weight 05/11/2015  . Pain in joint, lower leg 04/26/2015  . Diabetic neuropathy, type II diabetes mellitus (HCC) 04/26/2015  . Erectile dysfunction 04/12/2015  . Essential hypertension 04/06/2015  . Facial cellulitis 04/03/2015  . Hyperglycemia due to type 2 diabetes mellitus (HCC)   . Hyponatremia   . Absolute anemia     No change to perineum or abdomen  MRI results noted  Can  Advance diet  WBC down today  Continue to monitor   Abdomen remains benign at this point   Continue ABX     LOS: 6 days    Reymundo Winship A. 11/01/2015

## 2015-11-01 NOTE — Progress Notes (Signed)
  VASCULAR LAB PRELIMINARY  PRELIMINARY  PRELIMINARY  PRELIMINARY  Bilateral lower extremity venous duplex completed.     Bilateral:  No evidence of DVT, superficial thrombosis, or Baker's Cyst.  Other findings: bilateral-lymph nodes in groin area  Shane Dougherty, RVT, RDMS 11/01/2015, 2:24 PM

## 2015-11-02 ENCOUNTER — Inpatient Hospital Stay (HOSPITAL_COMMUNITY): Payer: Managed Care, Other (non HMO)

## 2015-11-02 ENCOUNTER — Encounter (HOSPITAL_COMMUNITY): Admission: EM | Disposition: E | Payer: Self-pay | Source: Home / Self Care | Attending: Family Medicine

## 2015-11-02 ENCOUNTER — Ambulatory Visit (HOSPITAL_COMMUNITY): Admit: 2015-11-02 | Payer: Self-pay | Admitting: Cardiology

## 2015-11-02 ENCOUNTER — Encounter (HOSPITAL_COMMUNITY): Payer: Self-pay

## 2015-11-02 DIAGNOSIS — E871 Hypo-osmolality and hyponatremia: Secondary | ICD-10-CM

## 2015-11-02 DIAGNOSIS — I639 Cerebral infarction, unspecified: Secondary | ICD-10-CM | POA: Insufficient documentation

## 2015-11-02 DIAGNOSIS — I340 Nonrheumatic mitral (valve) insufficiency: Secondary | ICD-10-CM

## 2015-11-02 DIAGNOSIS — I6340 Cerebral infarction due to embolism of unspecified cerebral artery: Secondary | ICD-10-CM

## 2015-11-02 DIAGNOSIS — K611 Rectal abscess: Secondary | ICD-10-CM | POA: Insufficient documentation

## 2015-11-02 DIAGNOSIS — N179 Acute kidney failure, unspecified: Secondary | ICD-10-CM

## 2015-11-02 HISTORY — PX: TEE WITHOUT CARDIOVERSION: SHX5443

## 2015-11-02 LAB — CBC
HEMATOCRIT: 27.5 % — AB (ref 39.0–52.0)
HEMOGLOBIN: 9.2 g/dL — AB (ref 13.0–17.0)
MCH: 25.8 pg — AB (ref 26.0–34.0)
MCHC: 33.5 g/dL (ref 30.0–36.0)
MCV: 77.2 fL — AB (ref 78.0–100.0)
Platelets: 450 10*3/uL — ABNORMAL HIGH (ref 150–400)
RBC: 3.56 MIL/uL — ABNORMAL LOW (ref 4.22–5.81)
RDW: 14.1 % (ref 11.5–15.5)
WBC: 42.6 10*3/uL — ABNORMAL HIGH (ref 4.0–10.5)

## 2015-11-02 LAB — GLUCOSE, CAPILLARY
GLUCOSE-CAPILLARY: 187 mg/dL — AB (ref 65–99)
GLUCOSE-CAPILLARY: 219 mg/dL — AB (ref 65–99)
GLUCOSE-CAPILLARY: 195 mg/dL — AB (ref 65–99)
Glucose-Capillary: 140 mg/dL — ABNORMAL HIGH (ref 65–99)
Glucose-Capillary: 119 mg/dL — ABNORMAL HIGH (ref 65–99)

## 2015-11-02 LAB — RENAL FUNCTION PANEL
Anion gap: 10 (ref 5–15)
BUN: 45 mg/dL — AB (ref 6–20)
CO2: 17 mmol/L — AB (ref 22–32)
Calcium: 7.3 mg/dL — ABNORMAL LOW (ref 8.9–10.3)
Chloride: 104 mmol/L (ref 101–111)
Creatinine, Ser: 2.8 mg/dL — ABNORMAL HIGH (ref 0.61–1.24)
GFR calc Af Amer: 31 mL/min — ABNORMAL LOW (ref >60)
GFR calc non Af Amer: 27 mL/min — ABNORMAL LOW (ref >60)
GLUCOSE: 245 mg/dL — AB (ref 65–99)
PHOSPHORUS: 4.6 mg/dL (ref 2.5–4.6)
POTASSIUM: 3.4 mmol/L — AB (ref 3.5–5.1)
Sodium: 131 mmol/L — ABNORMAL LOW (ref 135–145)

## 2015-11-02 LAB — TROPONIN I
TROPONIN I: 0.11 ng/mL — AB (ref <0.031)
TROPONIN I: 0.25 ng/mL — AB (ref <0.031)
Troponin I: 0.39 ng/mL — ABNORMAL HIGH (ref <0.031)
Troponin I: 0.15 ng/mL — ABNORMAL HIGH (ref <0.031)

## 2015-11-02 LAB — PARATHYROID HORMONE, INTACT (NO CA): PTH: 76 pg/mL — ABNORMAL HIGH (ref 15–65)

## 2015-11-02 LAB — HEPARIN LEVEL (UNFRACTIONATED)
Heparin Unfractionated: <0.1 IU/mL — ABNORMAL LOW (ref 0.30–0.70)
Heparin Unfractionated: 0.15 IU/mL — ABNORMAL LOW (ref 0.30–0.70)
Heparin Unfractionated: 0.24 [IU]/mL — ABNORMAL LOW (ref 0.30–0.70)

## 2015-11-02 SURGERY — ECHOCARDIOGRAM, TRANSESOPHAGEAL
Anesthesia: Moderate Sedation

## 2015-11-02 MED ORDER — MIDAZOLAM HCL 5 MG/ML IJ SOLN
INTRAMUSCULAR | Status: AC
  Filled 2015-11-02: qty 2

## 2015-11-02 MED ORDER — HEPARIN (PORCINE) IN NACL 100-0.45 UNIT/ML-% IJ SOLN: 1550.0000 [IU]/h | INTRAMUSCULAR | Status: DC

## 2015-11-02 MED ORDER — FENTANYL CITRATE (PF) 100 MCG/2ML IJ SOLN
INTRAMUSCULAR | Status: AC
  Filled 2015-11-02: qty 2

## 2015-11-02 MED ORDER — BUTAMBEN-TETRACAINE-BENZOCAINE 2-2-14 % EX AERO
INHALATION_SPRAY | CUTANEOUS | Status: DC | PRN
  Administered 2015-11-02: 2 via TOPICAL

## 2015-11-02 MED ORDER — MIDAZOLAM HCL 10 MG/2ML IJ SOLN
INTRAMUSCULAR | Status: DC | PRN
  Administered 2015-11-02: 2 mg via INTRAVENOUS
  Administered 2015-11-02: 1 mg via INTRAVENOUS

## 2015-11-02 MED ORDER — FENTANYL CITRATE (PF) 100 MCG/2ML IJ SOLN
INTRAMUSCULAR | Status: DC | PRN
  Administered 2015-11-02: 25 ug via INTRAVENOUS

## 2015-11-02 MED ORDER — HEPARIN (PORCINE) IN NACL 100-0.45 UNIT/ML-% IJ SOLN
1400.0000 [IU]/h | INTRAMUSCULAR | Status: DC
  Administered 2015-11-02: 1400 [IU]/h via INTRAVENOUS
  Filled 2015-11-02: qty 250

## 2015-11-02 NOTE — Consult Note (Signed)
Subjective:  Doing well today. No complaints. Buttock pain improved. Objective Vital signs in last 24 hours: Filed Vitals:   10/08/2015 0800 10/29/2015 0900 10/29/2015 1000 10/18/2015 1100  BP:   126/76   Pulse: 74 79 80 84  Temp:      TempSrc:      Resp: 8 13 10 11   Height:      Weight:      SpO2: 96% 100% 100% 96%   Weight change:   Intake/Output Summary (Last 24 hours) at 10/29/2015 1211 Last data filed at 10/31/2015 1100  Gross per 24 hour  Intake 1029.92 ml  Output   1175 ml  Net -145.08 ml    Assessment/ Plan: Pt is a 40 y.o. yo male who was admitted on 11/06/2015 with a left buttock abscess, noted to have AoCKD possibly 2/2 ATN from hypoperfusion from sepsis (abscess).  Assessment/Plan: 1. Acute on Chronic Kidney Disease; in setting of Sepsis; possible ischemic ATN: (CKD likely 2/2 poorly controlled DM) UOP greatly improved at 975cc in 24hrs. Cr improved to 2.80 today, w/ Baseline around 1.6. No lasix for now. Labs for GN were unremarkable. Getting PTH as Phos is back down to 4.6. Looks like renal function is recovering w/o need for hemodialysis. Lower extremity edema will likely not improve until Albumin improves (currently <1.0). Will follow.   2. BP/Volume: UOP 975cc; up 12.4L since admission. BPs good now. 3. Anemia: Hgb 9.2 but stable. Will monitor. WBC improving, now at 42 4. Abscess: per surgery 5. Diarrhea: resolved. C Diff PCR neg.   6. Diabetes: poorly controlled. Likely cause of CKD. Management per primary team. 7. Embolic strokes:  No clear source identified with spine images. Awaiting TEE to r/o SBE and consider neuro and/or ID involement  Mickie Hillier    Labs: Basic Metabolic Panel:  Recent Labs Lab 10/31/15 0349 11/01/15 0538 11/04/2015 0252  NA 132* 130* 131*  K 3.3* 3.0* 3.4*  CL 105 102 104  CO2 14* 15* 17*  GLUCOSE 101* 203* 245*  BUN 45* 47* 45*  CREATININE 4.17* 3.63* 2.80*  CALCIUM 7.1* 7.1* 7.3*  PHOS 6.6* 6.0* 4.6   Liver Function  Tests:  Recent Labs Lab 10/29/15 0311  10/31/15 0349 11/01/15 0538 10/30/2015 0252  AST 30  --   --   --   --   ALT 8*  --   --   --   --   ALKPHOS 521*  --   --   --   --   BILITOT 1.0  --   --   --   --   PROT 5.2*  --   --   --   --   ALBUMIN 1.0*  < > <1.0* <1.0* <1.0*  < > = values in this interval not displayed. No results for input(s): LIPASE, AMYLASE in the last 168 hours. No results for input(s): AMMONIA in the last 168 hours. CBC:  Recent Labs Lab 2015-11-06 1318  10/29/15 0311 10/30/15 0913 10/31/15 0349 11/01/15 0538 10/20/2015 0252  WBC 22.8*  < > 46.0* 49.9* 53.3* 50.6* 42.6*  NEUTROABS 20.7*  --   --   --   --  47.6*  --   HGB 10.6*  < > 8.5* 8.3* 8.5* 8.6* 9.2*  HCT 31.6*  < > 26.4* 25.9* 24.9* 25.2* 27.5*  MCV 78.6  < > 81.7 79.0 78.5 77.5* 77.2*  PLT 453*  < > 479* 438* 445* 482* 450*  < > = values in this  interval not displayed. Cardiac Enzymes:  Recent Labs Lab 11/01/15 0538 11/01/15 1331 11/01/15 1944 11/04/2015 0251 10/29/2015 0800  TROPONINI 0.32* 0.54* 0.13* 0.11* 0.39*   CBG:  Recent Labs Lab 11/01/15 0323 11/01/15 0753 11/01/15 1630 11/01/15 2130 10/20/2015 0743  GLUCAP 193* 175* 175* 138* 187*    Iron Studies: No results for input(s): IRON, TIBC, TRANSFERRIN, FERRITIN in the last 72 hours. Studies/Results: Mr Brain Wo Contrast  10/31/2015  CLINICAL DATA:  Recent drainage of left gluteal abscess. Now with leg weakness. Diabetes EXAM: MRI HEAD WITHOUT CONTRAST MRI CERVICAL SPINE WITHOUT CONTRAST TECHNIQUE: Multiplanar, multiecho pulse sequences of the brain and surrounding structures, and cervical spine, to include the craniocervical junction and cervicothoracic junction, were obtained without intravenous contrast. COMPARISON:  MRI 08/20/2014 FINDINGS: MRI HEAD FINDINGS Multiple small foci of restricted diffusion in the cerebral hemispheres bilaterally. These are present in the posterior temporal lobe bilaterally, the left parietal  periventricular white matter, frontal lobes bilaterally. Sparing of the brainstem and cerebellum. 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 Ventricle size normal.  Cerebral volume normal. Negative for intracranial hemorrhage. Negative for mass or edema. No shift of the midline structures. Mucosal thickening throughout the paranasal sinuses. Mastoid sinus effusion bilaterally. Pituitary and skull base normal.  Circle Willis patent. MRI CERVICAL SPINE FINDINGS Image quality degraded by motion. Straightening of the cervical lordosis with mild kyphosis. Normal alignment. Negative for fracture or mass Spinal cord signal is normal.  No cord compression or cord lesion. Mild cervical degenerative change. No disc protrusion or spinal stenosis. Diffusion-weighted imaging of the cord negative for infarct. 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 Negative for intracranial hemorrhage or mass No acute abnormality in the cervical spine. Negative for spinal stenosis. Electronically Signed   By: Marlan Palau M.D.   On: 10/31/2015 21:11   Mr Cervical Spine Wo Contrast  10/31/2015  CLINICAL DATA:  Recent drainage of left gluteal abscess. Now with leg weakness. Diabetes EXAM: MRI HEAD WITHOUT CONTRAST MRI CERVICAL SPINE WITHOUT CONTRAST TECHNIQUE: Multiplanar, multiecho pulse sequences of the brain and surrounding structures, and cervical spine, to include the craniocervical junction and cervicothoracic junction, were obtained without intravenous contrast. COMPARISON:  MRI 08/20/2014 FINDINGS: MRI HEAD FINDINGS Multiple small foci of restricted diffusion in the cerebral hemispheres bilaterally. These are present in the posterior temporal lobe bilaterally, the left parietal periventricular white matter, frontal lobes bilaterally. Sparing of the brainstem and cerebellum. These are most consistent  with acute infarcts in the bilateral MCA and left ACA territory. Ventricle size normal.  Cerebral volume normal. Negative for intracranial hemorrhage. Negative for mass or edema. No shift of the midline structures. Mucosal thickening throughout the paranasal sinuses. Mastoid sinus effusion bilaterally. Pituitary and skull base normal.  Circle Willis patent. MRI CERVICAL SPINE FINDINGS Image quality degraded by motion. Straightening of the cervical lordosis with mild kyphosis. Normal alignment. Negative for fracture or mass Spinal cord signal is normal.  No cord compression or cord lesion. Mild cervical degenerative change. No disc protrusion or spinal stenosis. Diffusion-weighted imaging of the cord negative for infarct. 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 Negative for intracranial hemorrhage or mass No acute abnormality in the cervical spine. Negative for spinal stenosis. Electronically Signed   By: Marlan Palau M.D.   On: 10/31/2015 21:11   Mr Thoracic Spine Wo Contrast  11/01/2015  CLINICAL DATA:  Sepsis and end-stage renal disease patient. Weakness. The patient is unable to move his left upper extremity. No known injury. Initial encounter. EXAM: MRI THORACIC SPINE WITHOUT CONTRAST TECHNIQUE: Multiplanar, multisequence MR imaging of the thoracic spine was performed. No intravenous contrast was administered. COMPARISON:  CT abdomen and pelvis 10/29/2015 reviewed. FINDINGS: Vertebral body height and alignment are maintained. No worrisome marrow lesion is identified. Marrow signal is somewhat decreased on T1 weighted imaging likely related to marrow stimulation in association with dialysis. There is no evidence of discitis or osteomyelitis. No epidural abscess is identified. The thoracic cord demonstrates normal signal throughout. Disc height and hydration are maintained at all levels. Paraspinous structures demonstrate  bilateral pleural effusions which appear small to moderate. IMPRESSION: Normal-appearing thoracic spine. Negative for evidence of infection. Small moderate bilateral pleural effusions. Electronically Signed   By: Drusilla Kanner M.D.   On: 11/01/2015 13:56   Medications: Infusions: . sodium chloride 20 mL/hr (11/01/15 1652)  . heparin 1,100 Units/hr (10/20/2015 0449)    Scheduled Medications: . ampicillin-sulbactam (UNASYN) IV  1.5 g Intravenous Q12H  . aspirin  325 mg Oral QHS  . atorvastatin  80 mg Oral q1800  . cholecalciferol  2,000 Units Oral Daily  . docusate sodium  100 mg Oral BID  . insulin aspart  0-9 Units Subcutaneous TID WC  . insulin glargine  15 Units Subcutaneous QHS  . thiamine  100 mg Oral Daily    have reviewed scheduled and prn medications.  Physical Exam: General -- NAD, laying in bed, talking to brother on phone. Chest -- good expansion. Lungs clear to auscultation. Cardiac -- RRR. No murmurs noted.  Abdomen -- soft, nontender. No masses palpable. Bowel sounds present.  Extremeties - +1 pitting edema in LE bilaterally. Dorsalis pedis pulses present and symmetrical.    Kathee Delton, MD,MS,  PGY2 11/01/2015 12:11 PM  I have seen and examined this patient and agree with plan as outlined by Dr. Wende Mott with additions made in red.  Improving from renal standpoint.  Issue of embolic strokes remains and lack of source.  Will need further evaluation and workup which is ongoing. Elishua Radford Robbin Loughmiller,MD 10/08/2015 12:23 PM

## 2015-11-02 NOTE — Progress Notes (Signed)
  Echocardiogram Echocardiogram Transesophageal has been performed.  Shane Dougherty 10/17/2015, 2:35 PM

## 2015-11-02 NOTE — Progress Notes (Signed)
6 Days Post-Op  Subjective: Feels a little better  Objective: Vital signs in last 24 hours: Temp:  [98 F (36.7 C)-100 F (37.8 C)] 98 F (36.7 C) (04/27 0745) Pulse Rate:  [74-102] 79 (04/27 0900) Resp:  [8-19] 13 (04/27 0900) BP: (108-131)/(59-88) 109/74 mmHg (04/27 0745) SpO2:  [96 %-100 %] 100 % (04/27 0900) Last BM Date: 11/01/15  Intake/Output from previous day: 04/26 0701 - 04/27 0700 In: 1460.9 [P.O.:680; I.V.:680.9; IV Piggyback:100] Out: 975 [Urine:975] Intake/Output this shift: Total I/O In: 122 [P.O.:60; I.V.:62] Out: -   Incision/Wound:maceration due to moisture with ulceration and drain   fluctuance and some necrotic debris noted at drain and ulcer site  Scrotal / penile edema the same   Abdomen soft NT   Lab Results:   Recent Labs  11/01/15 0538 15-Nov-2015 0252  WBC 50.6* 42.6*  HGB 8.6* 9.2*  HCT 25.2* 27.5*  PLT 482* 450*   BMET  Recent Labs  11/01/15 0538 11-15-2015 0252  NA 130* 131*  K 3.0* 3.4*  CL 102 104  CO2 15* 17*  GLUCOSE 203* 245*  BUN 47* 45*  CREATININE 3.63* 2.80*  CALCIUM 7.1* 7.3*   PT/INR No results for input(s): LABPROT, INR in the last 72 hours. ABG No results for input(s): PHART, HCO3 in the last 72 hours.  Invalid input(s): PCO2, PO2  Studies/Results: Mr Brain Wo Contrast  10/31/2015  CLINICAL DATA:  Recent drainage of left gluteal abscess. Now with leg weakness. Diabetes EXAM: MRI HEAD WITHOUT CONTRAST MRI CERVICAL SPINE WITHOUT CONTRAST TECHNIQUE: Multiplanar, multiecho pulse sequences of the brain and surrounding structures, and cervical spine, to include the craniocervical junction and cervicothoracic junction, were obtained without intravenous contrast. COMPARISON:  MRI 08/20/2014 FINDINGS: MRI HEAD FINDINGS Multiple small foci of restricted diffusion in the cerebral hemispheres bilaterally. These are present in the posterior temporal lobe bilaterally, the left parietal periventricular white matter, frontal lobes  bilaterally. Sparing of the brainstem and cerebellum. 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 Ventricle size normal.  Cerebral volume normal. Negative for intracranial hemorrhage. Negative for mass or edema. No shift of the midline structures. Mucosal thickening throughout the paranasal sinuses. Mastoid sinus effusion bilaterally. Pituitary and skull base normal.  Circle Willis patent. MRI CERVICAL SPINE FINDINGS Image quality degraded by motion. Straightening of the cervical lordosis with mild kyphosis. Normal alignment. Negative for fracture or mass Spinal cord signal is normal.  No cord compression or cord lesion. Mild cervical degenerative change. No disc protrusion or spinal stenosis. Diffusion-weighted imaging of the cord negative for infarct. 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 Negative for intracranial hemorrhage or mass No acute abnormality in the cervical spine. Negative for spinal stenosis. Electronically Signed   By: Marlan Palau M.D.   On: 10/31/2015 21:11   Mr Cervical Spine Wo Contrast  10/31/2015  CLINICAL DATA:  Recent drainage of left gluteal abscess. Now with leg weakness. Diabetes EXAM: MRI HEAD WITHOUT CONTRAST MRI CERVICAL SPINE WITHOUT CONTRAST TECHNIQUE: Multiplanar, multiecho pulse sequences of the brain and surrounding structures, and cervical spine, to include the craniocervical junction and cervicothoracic junction, were obtained without intravenous contrast. COMPARISON:  MRI 08/20/2014 FINDINGS: MRI HEAD FINDINGS Multiple small foci of restricted diffusion in the cerebral hemispheres bilaterally. These are present in the posterior temporal lobe bilaterally, the left parietal periventricular white matter, frontal lobes bilaterally. Sparing of the brainstem and cerebellum. These are most  consistent with acute infarcts in the bilateral MCA and  left ACA territory. Ventricle size normal.  Cerebral volume normal. Negative for intracranial hemorrhage. Negative for mass or edema. No shift of the midline structures. Mucosal thickening throughout the paranasal sinuses. Mastoid sinus effusion bilaterally. Pituitary and skull base normal.  Circle Willis patent. MRI CERVICAL SPINE FINDINGS Image quality degraded by motion. Straightening of the cervical lordosis with mild kyphosis. Normal alignment. Negative for fracture or mass Spinal cord signal is normal.  No cord compression or cord lesion. Mild cervical degenerative change. No disc protrusion or spinal stenosis. Diffusion-weighted imaging of the cord negative for infarct. 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 Negative for intracranial hemorrhage or mass No acute abnormality in the cervical spine. Negative for spinal stenosis. Electronically Signed   By: Marlan Palau M.D.   On: 10/31/2015 21:11   Mr Thoracic Spine Wo Contrast  11/01/2015  CLINICAL DATA:  Sepsis and end-stage renal disease patient. Weakness. The patient is unable to move his left upper extremity. No known injury. Initial encounter. EXAM: MRI THORACIC SPINE WITHOUT CONTRAST TECHNIQUE: Multiplanar, multisequence MR imaging of the thoracic spine was performed. No intravenous contrast was administered. COMPARISON:  CT abdomen and pelvis 10/29/2015 reviewed. FINDINGS: Vertebral body height and alignment are maintained. No worrisome marrow lesion is identified. Marrow signal is somewhat decreased on T1 weighted imaging likely related to marrow stimulation in association with dialysis. There is no evidence of discitis or osteomyelitis. No epidural abscess is identified. The thoracic cord demonstrates normal signal throughout. Disc height and hydration are maintained at all levels. Paraspinous structures demonstrate bilateral pleural effusions which appear small  to moderate. IMPRESSION: Normal-appearing thoracic spine. Negative for evidence of infection. Small moderate bilateral pleural effusions. Electronically Signed   By: Drusilla Kanner M.D.   On: 11/01/2015 13:56   Mr Lumbar Spine Wo Contrast  10/31/2015  CLINICAL DATA:  Leukocytosis and fever with perirectal abscess status post irrigation and drainage x2. Lower extremity weakness with difficulty ambulating. History of chronic kidney disease and diabetes. EXAM: MRI LUMBAR SPINE WITHOUT CONTRAST TECHNIQUE: Multiplanar, multisequence MR imaging of the lumbar spine was performed. No intravenous contrast was administered. COMPARISON:  Abdominal pelvic CT 10/29/2015 FINDINGS: Segmentation: Conventional anatomy assumed, with the last open disc space designated L5-S1. Alignment:  Normal. Vertebrae: There is generalized decreased T1 and T2 marrow signal throughout the visualized bones which is attributed to chronic kidney disease. No acute osseous findings. No evidence of pars defect or discitis. Small erosions along the sacroiliac joints are likely related to the patient's renal disease and secondary hyperparathyroidism. No evidence of sacroiliac joint effusion to suggest infection. Conus medullaris: Extends to the L1 level and appears normal. No evidence of epidural fluid collection. Paraspinal and other soft tissues: There is extensive edema throughout the paraspinal soft tissues, subcutaneous fat and retroperitoneum. No focal fluid collections are seen to suggest infection. There is free pelvic fluid, similar to recent CT. Disc levels: All of the lumbar discs are well hydrated with maintained height. No evidence of disc herniation, spinal stenosis or nerve root encroachment. No significant facet arthropathy. IMPRESSION: 1. No evidence of spinal infection. Specifically, no evidence of discitis, facet joint effusion or epidural abscess. 2. No disc herniation, spinal stenosis or nerve root encroachment. 3. Extensive soft  tissue edema throughout the subcutaneous, retroperitoneal and peritoneal soft tissues, consistent with anasarca, similar to recent CT. 4. Marrow and sacroiliac changes attributed to underlying renal disease.  Electronically Signed   By: Carey Bullocks M.D.   On: 10/31/2015 11:30    Anti-infectives: Anti-infectives    Start     Dose/Rate Route Frequency Ordered Stop   10/31/15 1300  ampicillin-sulbactam (UNASYN) 1.5 g in sodium chloride 0.9 % 50 mL IVPB     1.5 g 100 mL/hr over 30 Minutes Intravenous Every 12 hours 10/31/15 1214     10/29/15 2000  metroNIDAZOLE (FLAGYL) IVPB 500 mg  Status:  Discontinued     500 mg 100 mL/hr over 60 Minutes Intravenous Every 8 hours 10/29/15 1852 10/30/15 1312   10/29/15 0800  vancomycin (VANCOCIN) IVPB 750 mg/150 ml premix  Status:  Discontinued     750 mg 150 mL/hr over 60 Minutes Intravenous Every 24 hours 10/28/15 1058 10/31/15 0750   10/18/2015 0800  vancomycin (VANCOCIN) 500 mg in sodium chloride 0.9 % 100 mL IVPB  Status:  Discontinued     500 mg 100 mL/hr over 60 Minutes Intravenous Every 12 hours 10/29/2015 1931 10/28/15 1058   10/31/2015 0200  piperacillin-tazobactam (ZOSYN) IVPB 3.375 g  Status:  Discontinued     3.375 g 12.5 mL/hr over 240 Minutes Intravenous Every 8 hours 10/10/2015 1938 10/31/15 1205   10/28/2015 2000  piperacillin-tazobactam (ZOSYN) IVPB 3.375 g     3.375 g 100 mL/hr over 30 Minutes Intravenous  Once 10/15/2015 1903 10/25/2015 2117   10/24/2015 2000  vancomycin (VANCOCIN) IVPB 1000 mg/200 mL premix     1,000 mg 200 mL/hr over 60 Minutes Intravenous  Once 10/25/2015 1903 10/14/2015 2147      Assessment/Plan: s/p Procedure(s): INCISION AND DRAINAGE ABSCESS/GLUTEAL (Left) Patient Active Problem List   Diagnosis Date Noted  . Abscess, gluteal, left   . Precordial pain   . NSTEMI (non-ST elevated myocardial infarction) (HCC)   . Pericardial effusion   . Pressure ulcer 10/31/2015  . AKI (acute kidney injury) (HCC) 10/29/2015  .  Peri-rectal abscess 10/11/2015  . CKD (chronic kidney disease) 11/04/2015  . Leukocytosis 10/13/2015  . Loss of weight 05/11/2015  . Pain in joint, lower leg 04/26/2015  . Diabetic neuropathy, type II diabetes mellitus (HCC) 04/26/2015  . Erectile dysfunction 04/12/2015  . Essential hypertension 04/06/2015  . Facial cellulitis 04/03/2015  . Hyperglycemia due to type 2 diabetes mellitus (HCC)   . Hyponatremia   . Absolute anemia     Perineum macerated with adjacent ulceration  Need to roll patient and get him OOB if possible   WBC better   Moving legs a little better as well  Keep penrose in for now     LOS: 7 days    Kenechukwu Eckstein A. 11/02/2015

## 2015-11-02 NOTE — Progress Notes (Addendum)
ANTICOAGULATION CONSULT NOTE - Follow Up Consult  Pharmacy Consult for Heparin Indication: chest pain/ACS  Allergies  Allergen Reactions  . Bee Venom Swelling    Throat involvement    Patient Measurements: Height: 5\' 7"  (170.2 cm) Weight: 148 lb (67.132 kg) IBW/kg (Calculated) : 66.1  Vital Signs: Temp: 98 F (36.7 C) (04/27 1416) Temp Source: Oral (04/27 1416) BP: 129/81 mmHg (04/27 1428) Pulse Rate: 85 (04/27 1500)  Labs:  Recent Labs  10/31/15 0349  11/01/15 0538  11/06/15 0251 2015/11/06 0252 2015-11-06 0800 11-06-15 1445  HGB 8.5*  --  8.6*  --   --  9.2*  --   --   HCT 24.9*  --  25.2*  --   --  27.5*  --   --   PLT 445*  --  482*  --   --  450*  --   --   HEPARINUNFRC  --   --   --   --  <0.10*  --   --  0.15*  CREATININE 4.17*  --  3.63*  --   --  2.80*  --   --   TROPONINI  --   < > 0.32*  < > 0.11*  --  0.39* 0.25*  < > = values in this interval not displayed.  Estimated Creatinine Clearance: 32.8 mL/min (by C-G formula based on Cr of 2.8).   Medications:  Heparin @ 1100 units/hr  Assessment: 39yom started on heparin yesterday for chest pain/elevated troponins in the setting of acute stroke. Initial heparin level was undetectable and rate increased. Follow up heparin level remains low at 0.15. CBC stable. Renal function improving. No bleeding reported. He is not a cath candidate per cardiology notes.   Goal of Therapy:  Heparin level 0.3-0.5 units/ml Monitor platelets by anticoagulation protocol: Yes   Plan:  1) Increase heparin to 1400 units/hr 2) Check heparin level in 6 hours  Fredrik Rigger 11-06-15,3:41 PM   Addendum: Heparin level has increased but still remains below goal at 0.24.  Plan: 1) Increase heparin further to 1550 units/hr 2) Follow up AM labs  Fredrik Rigger 2015/11/06, 10:24 PM

## 2015-11-02 NOTE — Progress Notes (Signed)
PROGRESS NOTE    BRONNER MACGILL  ATF:[id number] DOB: 07-08-1976 DOA: 10/30/2015 PCP: Jaclyn Shaggy, MD   Outpatient Specialists: Jaclyn Shaggy, MD  Brief Narrative:  Shane Dougherty is a 40 y.o. male with medical history significant of diabetes. He complains for diarrhea x 1 month. Nothing made better or worse. No history of crohn's/UC- no family hx either. He then a few days before 4/16 developed left buttock pain. He was seen in the ER where he was diagnosed with intergluteal infection with small abscess. An I/D was done that some purulent drainage. He was given norco, clindamycin and told to do warm soaks.  He presented back at [facility] [place] with worsening pain and pressure. tx to [facility] for further I&D in operating room.  Had urinary retention and foley placed.  Creatine and Urine output did not respond to fluid boluses.   Assessment & Plan:   Active Problems:   Hyperglycemia due to type 2 diabetes mellitus (HCC)   Hyponatremia   Essential hypertension   Diabetic neuropathy, type II diabetes mellitus (HCC)   Peri-rectal abscess   CKD (chronic kidney disease)   Leukocytosis   AKI (acute kidney injury) (HCC)   Pressure ulcer   Abscess, gluteal, left   Precordial pain   NSTEMI (non-ST elevated myocardial infarction) (HCC)   Pericardial effusion   Peri-rectal abscess - s/p I&D - blood cultures- NGTD - culture from I&D: gram + cocci in pairs- group B strep - HIV negative - pain control - Continue IV Abx- Unasyn  ? Crohn's disease GI consult and recommended primary treatment of his perirectal abscess. With improvement in renal function they recommend more advanced imaging to evaluate small bowel such a CT enterography or MR enterography. We will plan on having GI evaluated patient as an outpatient.  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  Acute kidney injury  on CKD stage 3 -baseline Cr around 1.6-1.8 -had I/O cath for urinary retention-- placed foley -renal U/S- no hydronephrosis but pleural effusions - nephrology consulted. Creatinine currently improving daily. -U/A ordered  Severe protein calorie malnutrition -albumin 1  Weakness -states he is unable to move LE (walked with cane before admission.Marland Kitchen) -sensation intact -B12 Thiamine level- IV thiamine -MRI of lumbar spine results reviewed - Neurology on board  Elevated troponin - consulted cardiology - Plan is for TEE. Patient will more than likely need further work up but overall condition will need to improve prior to said work up, based on cardiology's notes.   Small pleural effusions -echo pending  DVT prophylaxis:  Lovenox  Code Status: Full Code   Family Communication: none discussed with patient directly   Disposition Plan:  tx to SDU   Consultants:   CCS  nephrology  Procedures:   I&D   Subjective: No new complaints reported to me by patient.  Objective: Filed Vitals:   10/29/2015 1000 10/24/2015 1100 10/13/2015 1200 11/01/2015 1253  BP: 126/76  129/85 126/72  Pulse: 80 84 82 92  Temp:      TempSrc:    Oral  Resp: 10 11 13 15   Height:  Weight:      SpO2: 100% 96% 100% 100%    Intake/Output Summary (Last 24 hours) at 11/05/2015 1311 Last data filed at 11/05/2015 1300  Gross per 24 hour  Intake 1091.92 ml  Output   1175 ml  Net -83.08 ml   Filed Weights   10/28/2015 1154 10/28/2015 1800  Weight: 65.318 kg (144 lb) 67.132 kg (148 lb)    Examination:  General exam: Pt in nad, alert and awake Respiratory system: Clear to auscultation anterior- Respiratory effort normal. Cardiovascular system: S1 & S2  heard, RRR. No JVD, murmurs, rubs, gallops or clicks. +edema in hands/feet Gastrointestinal system: Abdomen is nondistended, soft and nontender. No organomegaly or masses felt. Normal bowel sounds heard. Psychiatry: mood and affect appropriate    Data Reviewed: I have personally reviewed following labs and imaging studies  CBC:  Recent Labs Lab 10/12/2015 1318  10/29/15 0311 10/30/15 0913 10/31/15 0349 11/01/15 0538 10/10/2015 0252  WBC 22.8*  < > 46.0* 49.9* 53.3* 50.6* 42.6*  NEUTROABS 20.7*  --   --   --   --  47.6*  --   HGB 10.6*  < > 8.5* 8.3* 8.5* 8.6* 9.2*  HCT 31.6*  < > 26.4* 25.9* 24.9* 25.2* 27.5*  MCV 78.6  < > 81.7 79.0 78.5 77.5* 77.2*  PLT 453*  < > 479* 438* 445* 482* 450*  < > = values in this interval not displayed. Basic Metabolic Panel:  Recent Labs Lab 10/29/15 1614 10/30/15 0603 10/31/15 0349 11/01/15 0538 11/01/15 1331 10/31/2015 0252  NA 132* 131* 132* 130*  --  131*  K 3.6 3.5 3.3* 3.0*  --  3.4*  CL 104 101 105 102  --  104  CO2 13* 13* 14* 15*  --  17*  GLUCOSE 98 26* 101* 203*  --  245*  BUN 40* 44* 45* 47*  --  45*  CREATININE 4.29* 4.57* 4.17* 3.63*  --  2.80*  CALCIUM 7.4* 7.4* 7.1* 7.1*  --  7.3*  MG  --   --   --   --  1.9  --   PHOS 6.0* 6.6* 6.6* 6.0*  --  4.6   GFR: Estimated Creatinine Clearance: 32.8 mL/min (by C-G formula based on Cr of 2.8). Liver Function Tests:  Recent Labs Lab 10/29/15 0311 10/29/15 1614 10/30/15 0603 10/31/15 0349 11/01/15 0538 10/12/2015 0252  AST 30  --   --   --   --   --   ALT 8*  --   --   --   --   --   ALKPHOS 521*  --   --   --   --   --   BILITOT 1.0  --   --   --   --   --   PROT 5.2*  --   --   --   --   --   ALBUMIN 1.0* <1.0* 1.0* <1.0* <1.0* <1.0*   No results for input(s): LIPASE, AMYLASE in the last 168 hours. No results for input(s): AMMONIA in the last 168 hours. Coagulation Profile: No results for input(s): INR, PROTIME in the last 168 hours. Cardiac Enzymes:  Recent  Labs Lab 11/01/15 0538 11/01/15 1331 11/01/15 1944 10/17/2015 0251 10/22/2015 0800  TROPONINI 0.32* 0.54* 0.13* 0.11* 0.39*   BNP (last 3 results) No results for input(s): PROBNP in the last 8760 hours. HbA1C: No results for input(s): HGBA1C in the last 72 hours. CBG:  Recent Labs Lab 11/01/15 [phone]  11/01/15 0753 11/01/15 1630 11/01/15 2130 10/23/2015 0743  GLUCAP 193* 175* 175* 138* 187*   Lipid Profile:  Recent Labs  11/01/15 1331  CHOL 140  HDL <10*  LDLCALC NOT CALCULATED  TRIG 217*  CHOLHDL NOT CALCULATED   Thyroid Function Tests: No results for input(s): TSH, T4TOTAL, FREET4, T3FREE, THYROIDAB in the last 72 hours. Anemia Panel: No results for input(s): VITAMINB12, FOLATE, FERRITIN, TIBC, IRON, RETICCTPCT in the last 72 hours. Urine analysis:    Component Value Date/Time   COLORURINE YELLOW 10/29/2015 1020   APPEARANCEUR TURBID* 10/29/2015 1020   LABSPEC 1.026 10/29/2015 1020   PHURINE 5.0 10/29/2015 1020   GLUCOSEU NEGATIVE 10/29/2015 1020   HGBUR MODERATE* 10/29/2015 1020   BILIRUBINUR NEGATIVE 10/29/2015 1020   KETONESUR NEGATIVE 10/29/2015 1020   PROTEINUR 100* 10/29/2015 1020   UROBILINOGEN 1.0 08/20/2014 1042   NITRITE NEGATIVE 10/29/2015 1020   LEUKOCYTESUR MODERATE* 10/29/2015 1020   Recent Results (from the past 240 hour(s))  Culture, blood (Routine X 2) w Reflex to ID Panel     Status: None   Collection Time: 10/17/2015  7:52 PM  Result Value Ref Range Status   Specimen Description BLOOD LEFT ANTECUBITAL  Final   Special Requests BOTTLES DRAWN AEROBIC ONLY 10CC  Final   Culture NO GROWTH 5 DAYS  Final   Report Status 10/31/2015 FINAL  Final  Culture, blood (Routine X 2) w Reflex to ID Panel     Status: None   Collection Time: 10/12/2015  8:00 PM  Result Value Ref Range Status   Specimen Description BLOOD LEFT FOREARM  Final   Special Requests BOTTLES DRAWN AEROBIC ONLY 9CC  Final   Culture NO GROWTH 5 DAYS  Final   Report Status 10/31/2015  FINAL  Final  Surgical pcr screen     Status: None   Collection Time: 11/01/2015  8:27 AM  Result Value Ref Range Status   MRSA, PCR NEGATIVE NEGATIVE Final   Staphylococcus aureus NEGATIVE NEGATIVE Final    Comment:        The Xpert SA Assay (FDA approved for NASAL specimens in patients over 7 years of age), is one component of a comprehensive surveillance program.  Test performance has been validated by [facility] for patients greater than or equal to 15 year old. It is not intended to diagnose infection nor to guide or monitor treatment.   Anaerobic culture     Status: None   Collection Time: 10/21/2015 11:27 AM  Result Value Ref Range Status   Specimen Description ABSCESS BUTTOCKS LEFT  Final   Special Requests NONE  Final   Gram Stain   Final    MODERATE WBC PRESENT,BOTH PMN AND MONONUCLEAR NO SQUAMOUS EPITHELIAL CELLS SEEN FEW GRAM POSITIVE COCCI IN PAIRS Performed at [facility]    Culture   Final    NO ANAEROBES ISOLATED Performed at [facility]    Report Status 11/01/2015 FINAL  Final  Culture, routine-abscess     Status: None   Collection Time: 10/17/2015 11:27 AM  Result Value Ref Range Status   Specimen Description ABSCESS BUTTOCKS LEFT  Final   Special Requests NONE  Final   Gram Stain   Final    MODERATE WBC PRESENT,BOTH PMN AND MONONUCLEAR NO SQUAMOUS EPITHELIAL CELLS SEEN FEW GRAM POSITIVE COCCI IN PAIRS Performed at [facility]    Culture   Final    MODERATE GROUP B STREP(S.AGALACTIAE)ISOLATED Note: Beta hemolytic streptococci are predictably susceptible to penicillin  and other beta lactams. Susceptibility testing not routinely performed. Performed at [facility]    Report Status 10/30/2015 FINAL  Final  C difficile quick scan w PCR reflex     Status: None   Collection Time: 10/29/15  4:34 PM  Result Value Ref Range Status   C Diff antigen NEGATIVE NEGATIVE Final   C Diff toxin NEGATIVE NEGATIVE Final   C Diff  interpretation Negative for toxigenic C. difficile  Final  Culture, Urine     Status: None   Collection Time: 10/29/15  7:36 PM  Result Value Ref Range Status   Specimen Description URINE, RANDOM  Final   Special Requests NONE  Final   Culture NO GROWTH 1 DAY  Final   Report Status 10/30/2015 FINAL  Final  MRSA PCR Screening     Status: None   Collection Time: 10/30/15  7:24 PM  Result Value Ref Range Status   MRSA by PCR NEGATIVE NEGATIVE Final    Comment:        The GeneXpert MRSA Assay (FDA approved for NASAL specimens only), is one component of a comprehensive MRSA colonization surveillance program. It is not intended to diagnose MRSA infection nor to guide or monitor treatment for MRSA infections.   Culture, blood (routine x 2)     Status: None (Preliminary result)   Collection Time: 11/01/15 12:20 AM  Result Value Ref Range Status   Specimen Description BLOOD RIGHT ARM  Final   Special Requests IN PEDIATRIC BOTTLE  Final   Culture NO GROWTH 1 DAY  Final   Report Status PENDING  Incomplete  Culture, blood (routine x 2)     Status: None (Preliminary result)   Collection Time: 11/01/15 12:46 AM  Result Value Ref Range Status   Specimen Description BLOOD LEFT HAND  Final   Special Requests BOTTLES DRAWN AEROBIC AND ANAEROBIC  Final   Culture NO GROWTH 1 DAY  Final   Report Status PENDING  Incomplete      Anti-infectives    Start     Dose/Rate Route Frequency Ordered Stop   10/31/15 1300  [MAR Hold]  ampicillin-sulbactam (UNASYN) 1.5 g in sodium chloride 0.9 % 50 mL IVPB     (MAR Hold since 10/25/2015 1251)   1.5 g 100 mL/hr over 30 Minutes Intravenous Every 12 hours 10/31/15 1214     10/29/15 2000  metroNIDAZOLE (FLAGYL) IVPB 500 mg  Status:  Discontinued     500 mg 100 mL/hr over 60 Minutes Intravenous Every 8 hours 10/29/15 1852 10/30/15 1312   10/29/15 0800  vancomycin (VANCOCIN) IVPB 750 mg/150 ml premix  Status:  Discontinued     750 mg 150 mL/hr over 60  Minutes Intravenous Every 24 hours 10/28/15 1058 10/31/15 0750   10/25/2015 0800  vancomycin (VANCOCIN) 500 mg in sodium chloride 0.9 % 100 mL IVPB  Status:  Discontinued     500 mg 100 mL/hr over 60 Minutes Intravenous Every 12 hours Nov 20, 2015 1931 10/28/15 1058   10/22/2015 0200  piperacillin-tazobactam (ZOSYN) IVPB 3.375 g  Status:  Discontinued     3.375 g 12.5 mL/hr over 240 Minutes Intravenous Every 8 hours Nov 20, 2015 1938 10/31/15 1205   Nov 20, 2015 2000  piperacillin-tazobactam (ZOSYN) IVPB 3.375 g     3.375 g 100 mL/hr over 30 Minutes Intravenous  Once 11-20-15 1903 Nov 20, 2015 2117   2015/11/20 2000  vancomycin (VANCOCIN) IVPB 1000 mg/200 mL premix     1,000 mg 200 mL/hr over 60 Minutes  Intravenous  Once 10/30/2015 1903 11/03/2015 2147       Radiology Studies: Mr Brain Wo Contrast  10/31/2015  CLINICAL DATA:  Recent drainage of left gluteal abscess. Now with leg weakness. Diabetes EXAM: MRI HEAD WITHOUT CONTRAST MRI CERVICAL SPINE WITHOUT CONTRAST TECHNIQUE: Multiplanar, multiecho pulse sequences of the brain and surrounding structures, and cervical spine, to include the craniocervical junction and cervicothoracic junction, were obtained without intravenous contrast. COMPARISON:  MRI 08/20/2014 FINDINGS: MRI HEAD FINDINGS Multiple small foci of restricted diffusion in the cerebral hemispheres bilaterally. These are present in the posterior temporal lobe bilaterally, the left parietal periventricular white matter, frontal lobes bilaterally. Sparing of the brainstem and cerebellum. 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 Ventricle size normal.  Cerebral volume normal. Negative for intracranial hemorrhage. Negative for mass or edema. No shift of the midline structures. Mucosal thickening throughout the paranasal sinuses. Mastoid sinus effusion bilaterally. Pituitary and skull base normal.  Circle Willis patent. MRI CERVICAL SPINE FINDINGS Image quality  degraded by motion. Straightening of the cervical lordosis with mild kyphosis. Normal alignment. Negative for fracture or mass Spinal cord signal is normal.  No cord compression or cord lesion. Mild cervical degenerative change. No disc protrusion or spinal stenosis. Diffusion-weighted imaging of the cord negative for infarct. 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 Negative for intracranial hemorrhage or mass No acute abnormality in the cervical spine. Negative for spinal stenosis. Electronically Signed   By: Marlan Palau M.D.   On: 10/31/2015 21:11   Mr Cervical Spine Wo Contrast  10/31/2015  CLINICAL DATA:  Recent drainage of left gluteal abscess. Now with leg weakness. Diabetes EXAM: MRI HEAD WITHOUT CONTRAST MRI CERVICAL SPINE WITHOUT CONTRAST TECHNIQUE: Multiplanar, multiecho pulse sequences of the brain and surrounding structures, and cervical spine, to include the craniocervical junction and cervicothoracic junction, were obtained without intravenous contrast. COMPARISON:  MRI 08/20/2014 FINDINGS: MRI HEAD FINDINGS Multiple small foci of restricted diffusion in the cerebral hemispheres bilaterally. These are present in the posterior temporal lobe bilaterally, the left parietal periventricular white matter, frontal lobes bilaterally. Sparing of the brainstem and cerebellum. 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 Ventricle size normal.  Cerebral volume normal. Negative for intracranial hemorrhage. Negative for mass or edema. No shift of the midline structures. Mucosal thickening throughout the paranasal sinuses. Mastoid sinus effusion bilaterally. Pituitary and skull base normal.  Circle Willis patent. MRI CERVICAL SPINE FINDINGS Image quality degraded by motion. Straightening of the cervical lordosis with mild kyphosis. Normal alignment. Negative for fracture or mass  Spinal cord signal is normal.  No cord compression or cord lesion. Mild cervical degenerative change. No disc protrusion or spinal stenosis. Diffusion-weighted imaging of the cord negative for infarct. 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 Negative for intracranial hemorrhage or mass No acute abnormality in the cervical spine. Negative for spinal stenosis. Electronically Signed   By: Marlan Palau M.D.   On: 10/31/2015 21:11   Mr Thoracic Spine Wo Contrast  11/01/2015  CLINICAL DATA:  Sepsis and end-stage renal disease patient. Weakness. The patient is unable to move his left upper extremity. No known injury. Initial encounter. EXAM: MRI THORACIC SPINE WITHOUT CONTRAST TECHNIQUE: Multiplanar, multisequence MR imaging of the thoracic spine was performed. No intravenous contrast was administered. COMPARISON:  CT abdomen and pelvis 10/29/2015  reviewed. FINDINGS: Vertebral body height and alignment are maintained. No worrisome marrow lesion is identified. Marrow signal is somewhat decreased on T1 weighted imaging likely related to marrow stimulation in association with dialysis. There is no evidence of discitis or osteomyelitis. No epidural abscess is identified. The thoracic cord demonstrates normal signal throughout. Disc height and hydration are maintained at all levels. Paraspinous structures demonstrate bilateral pleural effusions which appear small to moderate. IMPRESSION: Normal-appearing thoracic spine. Negative for evidence of infection. Small moderate bilateral pleural effusions. Electronically Signed   By: Drusilla Kanner M.D.   On: 11/01/2015 13:56        Scheduled Meds: . [MAR Hold] ampicillin-sulbactam (UNASYN) IV  1.5 g Intravenous Q12H  . [MAR Hold] aspirin  325 mg Oral QHS  . [MAR Hold] atorvastatin  80 mg Oral q1800  . [MAR Hold] cholecalciferol  2,000 Units Oral Daily  . [MAR Hold] docusate sodium   100 mg Oral BID  . [MAR Hold] insulin aspart  0-9 Units Subcutaneous TID WC  . [MAR Hold] insulin glargine  15 Units Subcutaneous QHS  . [MAR Hold] thiamine  100 mg Oral Daily   Continuous Infusions: . sodium chloride 20 mL/hr (11/01/15 1652)  . heparin 1,100 Units/hr (10/07/2015 0449)     LOS: 7 days    Time spent: 25 min    Penny Pia, DO [facility] Pager [phone]  If 7PM-7AM, please contact night-coverage [email or web address] Password [email or web address] 10/23/2015, 1:11 PM

## 2015-11-02 NOTE — Progress Notes (Signed)
Went to room to do VC/ NIF.  Pt sleepy, pt states he doesn't want to do test now.  No distress noted, VSS.

## 2015-11-02 NOTE — Progress Notes (Signed)
Physical Therapy Treatment Patient Details Name: RAJINDER DOBBERSTEIN MRN: [id number] DOB: 01-Jul-1976 Today's Date: 10/22/2015    History of Present Illness Pt is a 40 y/o male who presents s/p I&D of L gluteal abscess. Pt had been seen in ED a few days prior to admission and also had an I&D done at that time.PMH significant for DM. MRI 4/24 revealed Multiple small foci of restricted diffusion in both cerebral hemispheres most consistent with embolic infarctions.     PT Comments    Pt admitted with above diagnosis. Pt currently with functional limitations due to balance and endurance deficits. Pt was able to sit EOB for 5 min with min guard assist once pt found balance point.  LLE still weaker than right LE but pt seemed to move LEs a little better today than last treatments.  Hopeful that pt will continue to improve for transition to rehab once medical issues resolved.   Pt will benefit from skilled PT to increase their independence and safety with mobility to allow discharge to the venue listed below.    Follow Up Recommendations  CIR;Supervision/Assistance - 24 hour     Equipment Recommendations  Rolling walker with 5" wheels;3in1 (PT)    Recommendations for Other Services Rehab consult     Precautions / Restrictions Precautions Precautions: Fall Restrictions Weight Bearing Restrictions: No    Mobility  Bed Mobility Overal bed mobility: Needs Assistance Bed Mobility: Supine to Sit     Supine to sit: Max assist;HOB elevated     General bed mobility comments: Pt required max assist for LE movement to EOB.  Could not grasp rails due to swollen hands.  Pt needed max assist side to sit.  Once assisted to EOB with pad and pt cued for balance, began to sit with min guard assist.  Posterior lean upond initial sitting up.    Transfers                 General transfer comment: Unable  Ambulation/Gait             General Gait Details: Unable   Stairs             Wheelchair Mobility    Modified Rankin (Stroke Patients Only)       Balance Overall balance assessment: Needs assistance Sitting-balance support: Bilateral upper extremity supported;Feet supported Sitting balance-Leahy Scale: Poor Sitting balance - Comments: Pt was able to obtain balance at EOB for 5 min with UE support after cued and assisted to find balance point.  ATtimes pt needed min to mod assist with initial posterior lean.   Postural control: Posterior lean                          Cognition Arousal/Alertness: Awake/alert Behavior During Therapy: Flat affect Overall Cognitive Status: Within Functional Limits for tasks assessed                      Exercises General Exercises - Upper Extremity Shoulder Flexion: AAROM;Both;5 reps;Seated General Exercises - Lower Extremity Long Arc Quad: AAROM;Both;5 reps;Seated    General Comments General comments (skin integrity, edema, etc.): Again talked to pt about positioning to control UE edema. Also called nursing to ask for order for air mattress overlay.        Pertinent Vitals/Pain Pain Assessment: Faces Faces Pain Scale: Hurts even more Pain Location: hips, back, buttocks Pain Descriptors / Indicators: Aching;Pressure Pain Intervention(s): Limited activity within patient's  tolerance;Monitored during session;Repositioned  VSS    Home Living                      Prior Function            PT Goals (current goals can now be found in the care plan section) Progress towards PT goals: Progressing toward goals    Frequency  Min 3X/week    PT Plan Current plan remains appropriate    Co-evaluation             End of Session Equipment Utilized During Treatment: Gait belt Activity Tolerance: Patient limited by fatigue;Patient limited by pain Patient left: in bed;with bed alarm set;with call bell/phone within reach     Time: 1218-1232 PT Time Calculation (min) (ACUTE ONLY): 14  min  Charges:  $Therapeutic Activity: 8-22 mins                    G CodesBerline Lopes 11/13/2015, 2:08 PM  Seham Gardenhire,PT Acute Rehabilitation [phone] (pager)

## 2015-11-02 NOTE — Progress Notes (Signed)
RT NOTE:  Pt was sleeping when RT arrived for NIF. Pt to sleepy to give good effort. Will attempt if patient wakes up.

## 2015-11-02 NOTE — Progress Notes (Signed)
RT unavailable to do 0800 NIF and VC d/t called away multiple times. Charge RT aware.

## 2015-11-02 NOTE — Progress Notes (Signed)
Pt is off the floor for procedure at noon for the VC and NIF check.

## 2015-11-02 NOTE — Progress Notes (Signed)
ANTICOAGULATION CONSULT NOTE - Follow Up Consult  Pharmacy Consult for Heparin  Indication: chest pain/ACS  Allergies  Allergen Reactions  . Bee Venom Swelling    Throat involvement    Patient Measurements: Height: 5\' 7"  (170.2 cm) Weight: 148 lb (67.132 kg) IBW/kg (Calculated) : 66.1  Vital Signs: Temp: 98.6 F (37 C) (04/26 2316) Temp Source: Oral (04/26 2316) BP: 108/59 mmHg (04/27 0000) Pulse Rate: 87 (04/27 0000)  Labs:  Recent Labs  10/31/15 0349  11/01/15 0538 11/01/15 1331 11/01/15 1944 10/21/2015 0251 10/22/2015 0252  HGB 8.5*  --  8.6*  --   --   --  9.2*  HCT 24.9*  --  25.2*  --   --   --  27.5*  PLT 445*  --  482*  --   --   --  450*  HEPARINUNFRC  --   --   --   --   --  <0.10*  --   CREATININE 4.17*  --  3.63*  --   --   --  2.80*  TROPONINI  --   < > 0.32* 0.54* 0.13* 0.11*  --   < > = values in this interval not displayed.  Estimated Creatinine Clearance: 32.8 mL/min (by C-G formula based on Cr of 2.8).   Assessment: On heparin for elevated troponin, initial heparin level is undetectable  Goal of Therapy:  Heparin level 0.3-0.5 units/ml, due to stroke Monitor platelets by anticoagulation protocol: Yes   Plan:  -No bolus with recent stroke -Inc heparin to 1100 units/hr -1200 HL -Daily CBC/HL -Monitor for bleeding  Shane Dougherty 10/14/2015,4:21 AM

## 2015-11-02 NOTE — Interval H&P Note (Signed)
History and Physical Interval Note:  10/22/2015 1:49 PM  Shane Dougherty  has presented today for surgery, with the diagnosis of stroke  The various methods of treatment have been discussed with the patient and family. After consideration of risks, benefits and other options for treatment, the patient has consented to  Procedure(s): TRANSESOPHAGEAL ECHOCARDIOGRAM (TEE) (N/A) as a surgical intervention .  The patient's history has been reviewed, patient examined, no change in status, stable for surgery.  I have reviewed the patient's chart and labs.  Questions were answered to the patient's satisfaction.     SKAINS, MARK

## 2015-11-02 NOTE — Progress Notes (Signed)
STROKE TEAM PROGRESS NOTE   SUBJECTIVE (INTERVAL HISTORY) No family is at the bedside. He said his right leg seems a little stronger than yesterday. Waiting for TEE today. WBC trending down.   OBJECTIVE Temp:  [98 F (36.7 C)-98.6 F (37 C)] 98.6 F (37 C) (04/27 1709) Pulse Rate:  [74-102] 95 (04/27 1800) Cardiac Rhythm:  [-] Normal sinus rhythm (04/27 1900) Resp:  [8-39] 16 (04/27 1800) BP: (108-151)/(54-88) 123/54 mmHg (04/27 1800) SpO2:  [95 %-100 %] 98 % (04/27 1800)  CBC:   Recent Labs Lab 11/01/15 0538 10/26/2015 0252  WBC 50.6* 42.6*  NEUTROABS 47.6*  --   HGB 8.6* 9.2*  HCT 25.2* 27.5*  MCV 77.5* 77.2*  PLT 482* 450*    Basic Metabolic Panel:   Recent Labs Lab 11/01/15 0538 11/01/15 1331 10/22/2015 0252  NA 130*  --  131*  K 3.0*  --  3.4*  CL 102  --  104  CO2 15*  --  17*  GLUCOSE 203*  --  245*  BUN 47*  --  45*  CREATININE 3.63*  --  2.80*  CALCIUM 7.1*  --  7.3*  MG  --  1.9  --   PHOS 6.0*  --  4.6    Lipid Panel:     Component Value Date/Time   CHOL 140 11/01/2015 1331   TRIG 217* 11/01/2015 1331   HDL <10* 11/01/2015 1331   CHOLHDL NOT CALCULATED 11/01/2015 1331   VLDL 43* 11/01/2015 1331   LDLCALC NOT CALCULATED 11/01/2015 1331   HgbA1c:  Lab Results  Component Value Date   HGBA1C 14.6* 10/11/2015   Urine Drug Screen: No results found for: LABOPIA, COCAINSCRNUR, LABBENZ, AMPHETMU, THCU, LABBARB    IMAGING I have personally reviewed the radiological images below and agree with the radiology interpretations.  Mr Brain Wo Contrast 10/31/2015  Multiple small foci of restricted diffusion in both cerebral hemispheres most consistent with embolic infarctions. Negative for intracranial hemorrhage or mass   Mr Cervical Spine Wo Contrast 10/31/2015  No acute abnormality in the cervical spine. Negative for spinal stenosis.   Mr Lumbar Spine Wo Contrast 10/31/2015  1. No evidence of spinal infection. Specifically, no evidence of discitis,  facet joint effusion or epidural abscess. 2. No disc herniation, spinal stenosis or nerve root encroachment. 3. Extensive soft tissue edema throughout the subcutaneous, retroperitoneal and peritoneal soft tissues, consistent with anasarca, similar to recent CT. 4. Marrow and sacroiliac changes attributed to underlying renal disease.   Mr Thoracic Spine Wo Contrast  11/01/2015 IMPRESSION: Normal-appearing thoracic spine. Negative for evidence of infection. Small moderate bilateral pleural effusions.   2D echo - Left ventricle: The cavity size was normal. Wall thickness was  normal. Systolic function was normal. The estimated ejection  fraction was in the range of 55% to 60%. Wall motion was normal;  there were no regional wall motion abnormalities. Features are  consistent with a pseudonormal left ventricular filling pattern,  with concomitant abnormal relaxation and increased filling  pressure (grade 2 diastolic dysfunction). - Pulmonary arteries: Systolic pressure was mildly increased. PA  peak pressure: 46 mm Hg (S). - Pericardium, extracardiac: A small pericardial effusion was  identified. There was a left pleural effusion. Impressions: - Normal LV systolic function; grade 2 diastolic dysfunction; mild  TR; mildly elevated pulmonary pressure; small pericardial  effusion; respiratory variation across MV and IVC dilated; left  pleural effusion.  CUS Bilateral: 1-39% ICA stenosis. Vertebral artery flow is antegrade.  Venous doppler -  No evidence of DVT, superficial thrombosis, or Baker's Cyst.  TEE - No vegetations No thrombus Normal EF Mild MR Normal Bubble Study Trivial pericardial effusion   PHYSICAL EXAM  Temp:  [98 F (36.7 C)-98.6 F (37 C)] 98.6 F (37 C) (04/27 1709) Pulse Rate:  [74-102] 95 (04/27 1800) Resp:  [8-39] 16 (04/27 1800) BP: (108-151)/(54-88) 123/54 mmHg (04/27 1800) SpO2:  [95 %-100 %] 98 % (04/27 1800)  General - Well nourished, well  developed, in mild respiratory distress.  Ophthalmologic - Fundi not visualized due to noncooperation.  Cardiovascular - Regular rate and rhythm.  Mental Status -  Level of arousal and orientation to time, place, and person were intact. Language including expression, naming, repetition, comprehension was assessed and found intact. Fund of Knowledge was assessed and was intact.  Cranial Nerves II - XII - II - Visual field intact OU. III, IV, VI - Extraocular movements intact. V - Facial sensation intact bilaterally. VII - Facial movement intact bilaterally. VIII - Hearing & vestibular intact bilaterally. X - Palate elevates symmetrically. XI - Chin turning & shoulder shrug intact bilaterally. XII - Tongue protrusion intact.  Motor Strength - The patient's strength was 4-/5 BUEs proximal and distrally, 2/5 BLEs proximal hip rotation and 0/5 distally. No knee extension or flexion and no DF/PF bilaterally. Bulk was normal and fasciculations were absent.   Motor Tone - Muscle tone was assessed at the neck and appendages and was decreased bilaterally.  Reflexes - The patient's reflexes were diminished in all extremities and he had no pathological reflexes.  Sensory - BUEs: light touch, temperature/pinprick symmetrical but decreased but not diminished, distal worsen than proximal, vibration and proprioception symmetrical. BLEs: light touch, temperature/pinprick decreased but not diminished, distal worsen than proximal, vibration and proprioception symmetrical in knees but diminished in ankles and toes.    Coordination - The patient had normal movements in the hands with no ataxia or dysmetria, but slow and proportional to the weakness.  Tremor was absent.  Gait and Station - not able to teste due to weakness   ASSESSMENT/PLAN Mr. Shane Dougherty is a 40 y.o. male with history of uncontrolled DM, CKD, walk with cane at home, underwent I&D of peri-rectal abscess consulted for quadriparesis  after surgery. He did not receive IV t-PA due to out of window.   Stroke:  bilateral anterior circulation punctate infarct, embolic secondary to unknown source - most likely related to procoagulant state and activated platelets in the setting of severe acute infection  Resultant  quadiparesis  MRI  Bilateral anterior circulation punctate infact  MRA  Not ordered - would not change management  Carotid Doppler  Unremarkable   2D Echo  Unremarkable  TEE negative   LDL not calculated   HgbA1c 14.6  Heparin Q8 subq for VTE prophylaxis  No antithrombotic prior to admission, now on aspirin 325 mg daily. Continue on discharge  Patient counseled to be compliant with his antithrombotic medications  Ongoing aggressive stroke risk factor management  Therapy recommendations:  CIR  Disposition:  Pending  Quadriparesis - likely anterior spinal artery ASA infarct.  GBS or Transverse myelitis unlikely.  likely anterior spinal artery infarct given embolic infarcts at anterior circulation and non-progressive pattern - likely due to hypercoagulable state in the setting of severe acute infection - however, MRI C/T spine no spinal cord signal changes, LE vibration and proprioception loss (could be DM neuropathy).   GBS in DDx due to diminished DTR (could be due to DM neuropathy) -  however, sensory loss more prominent than GBS and non-progressive in nature - LP contraindicated due to nearby infection and also CSF likely unrevealing as DM pt also has high protein in CSF - ideally EMG/NCS may provide certain information  Transverse myelitis in DDx - However, MRI C/T negative - not ideal candidate for steriods due to uncontrolled DM  Continue NIF and VC monitoring   Continue aggressive PT/OT  Severe leukocytosis  WBC 53.3->50.6->42.6  ID on board  Continue Abx as per ID  Elevated troponin  Cardiology on board  On heparin IV now - continue 48 hours  Does have CKD with Cre 3.63, but  improving numbers down from 4.17 yesterday  Diabetes  HgbA1c 14.6, goal < 7.0  Uncontrolled  On lantus  SSI  CBG monitoring  Other Stroke Risk Factors  Passive smoker  Other Active Problems  Hx Bell's Palsy  acute on CKD  Anemia  Abscess - prei-rectal - surgery on board - on unasyn  Diarrhea   Hospital day # 7  Neurology will sign off. Please call with questions. Pt will follow up with Dr. Roda Shutters at [facility] in about 2 months. Thanks for the consult.   Marvel Plan, MD PhD Stroke Neurology 2015-11-15 8:30 PM   To contact Stroke Continuity provider, please refer to [email or web address]. After hours, contact General Neurology

## 2015-11-02 NOTE — Clinical Documentation Improvement (Addendum)
Internal Medicine Nephrology/Renal  Please clarify if the following diagnosis, Sepsis was:   Present at the time of admission (POA)  NOT present at the time of admission and it developed during the inpatient stay  Unable to clinically determine whether the condition was present on admission.  Unknown   Supporting Information: Wbc > 50, Pulse >90, rectal abscess/cutaneous abscess   Please exercise your independent, professional judgment when responding. A specific answer is not anticipated or expected.   Thank You,  Lavonda Jumbo [employer] [place] [phone]

## 2015-11-02 NOTE — Progress Notes (Signed)
BSW intern went to present bed offers to patient. Patient was in the process of going to sleep so he told me to leave the list in the room and he would review it later. BSW intern left supervisor number and name at the top for any questions or concerns.   Catheryn Bacon  BSW intern  [phone]   Merlyn Lot, [place] Clinical Social Worker [phone]

## 2015-11-02 NOTE — H&P (View-Only) (Signed)
PROGRESS NOTE    Shane Dougherty  MRN:[id number] DOB: 02/14/1976 DOA: 10/10/2015 PCP: Enobong, Amao, MD   Outpatient Specialists: Enobong Amao, MD  Brief Narrative:  Shane Dougherty is a 39 y.o. male with medical history significant of diabetes. He complains for diarrhea x 1 month. Nothing made better or worse. No history of crohn's/UC- no family hx either. He then a few days before 4/16 developed left buttock pain. He was seen in the ER where he was diagnosed with intergluteal infection with small abscess. An I/D was done that some purulent drainage. He was given norco, clindamycin and told to do warm soaks.  He presented back at [facility] with worsening pain and pressure. tx to [facility] for further I&D in operating room.  Had urinary retention and foley placed.  Creatine and Urine output did not respond to fluid boluses.   Assessment & Plan:   Active Problems:   Hyperglycemia due to type 2 diabetes mellitus (HCC)   Hyponatremia   Essential hypertension   Diabetic neuropathy, type II diabetes mellitus (HCC)   Peri-rectal abscess   CKD (chronic kidney disease)   Leukocytosis   AKI (acute kidney injury) (HCC)   Pressure ulcer   Abscess, gluteal, left   Precordial pain   NSTEMI (non-ST elevated myocardial infarction) (HCC)   Pericardial effusion   Peri-rectal abscess - s/p I&D - blood cultures- NGTD - culture from I&D: gram + cocci in pairs- group B strep - HIV negative - pain control - Continue IV Abx- Unasyn  ? Crohn's disease GI consult and recommended primary treatment of his perirectal abscess. With improvement in renal function they recommend more advanced imaging to evaluate small bowel such a CT enterography or MR enterography. We will plan on having GI evaluated patient as an outpatient.  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  Acute kidney injury  on CKD stage 3 -baseline Cr around 1.6-1.8 -had I/O cath for urinary retention-- placed foley -renal U/S- no hydronephrosis but pleural effusions - nephrology consulted. Creatinine currently improving daily. -U/A ordered  Severe protein calorie malnutrition -albumin 1  Weakness -states he is unable to move LE (walked with cane before admission..) -sensation intact -B12 Thiamine level- IV thiamine -MRI of lumbar spine results reviewed - Neurology on board  Elevated troponin - consulted cardiology - Plan is for TEE. Patient will more than likely need further work up but overall condition will need to improve prior to said work up, based on cardiology's notes.   Small pleural effusions -echo pending  DVT prophylaxis:  Lovenox  Code Status: Full Code   Family Communication: none discussed with patient directly   Disposition Plan:  tx to SDU   Consultants:   [facility]  nephrology  Procedures:   I&D   Subjective: No new complaints reported to me by patient.  Objective: Filed Vitals:   11/03/2015 1000 10/28/2015 1100 10/15/2015 1200 10/18/2015 1253  BP: 126/76  129/85 126/72  Pulse: 80 84 82 92  Temp:      TempSrc:    Oral  Resp: 10 11 13 15  Height:        Weight:      SpO2: 100% 96% 100% 100%    Intake/Output Summary (Last 24 hours) at 10/18/2015 1311 Last data filed at 10/28/2015 1300  Gross per 24 hour  Intake 1091.92 ml  Output   1175 ml  Net -83.08 ml   Filed Weights   11/04/2015 1154 10/28/2015 1800  Weight: 65.318 kg (144 lb) 67.132 kg (148 lb)    Examination:  General exam: Pt in nad, alert and awake Respiratory system: Clear to auscultation anterior- Respiratory effort normal. Cardiovascular system: S1 & S2  heard, RRR. No JVD, murmurs, rubs, gallops or clicks. +edema in hands/feet Gastrointestinal system: Abdomen is nondistended, soft and nontender. No organomegaly or masses felt. Normal bowel sounds heard. Psychiatry: mood and affect appropriate    Data Reviewed: I have personally reviewed following labs and imaging studies  CBC:  Recent Labs Lab 10/12/2015 1318  10/29/15 0311 10/30/15 0913 10/31/15 0349 11/01/15 0538 10/24/2015 0252  WBC 22.8*  < > 46.0* 49.9* 53.3* 50.6* 42.6*  NEUTROABS 20.7*  --   --   --   --  47.6*  --   HGB 10.6*  < > 8.5* 8.3* 8.5* 8.6* 9.2*  HCT 31.6*  < > 26.4* 25.9* 24.9* 25.2* 27.5*  MCV 78.6  < > 81.7 79.0 78.5 77.5* 77.2*  PLT 453*  < > 479* 438* 445* 482* 450*  < > = values in this interval not displayed. Basic Metabolic Panel:  Recent Labs Lab 10/29/15 1614 10/30/15 0603 10/31/15 0349 11/01/15 0538 11/01/15 1331 10/28/2015 0252  NA 132* 131* 132* 130*  --  131*  K 3.6 3.5 3.3* 3.0*  --  3.4*  CL 104 101 105 102  --  104  CO2 13* 13* 14* 15*  --  17*  GLUCOSE 98 26* 101* 203*  --  245*  BUN 40* 44* 45* 47*  --  45*  CREATININE 4.29* 4.57* 4.17* 3.63*  --  2.80*  CALCIUM 7.4* 7.4* 7.1* 7.1*  --  7.3*  MG  --   --   --   --  1.9  --   PHOS 6.0* 6.6* 6.6* 6.0*  --  4.6   GFR: Estimated Creatinine Clearance: 32.8 mL/min (by C-G formula based on Cr of 2.8). Liver Function Tests:  Recent Labs Lab 10/29/15 0311 10/29/15 1614 10/30/15 0603 10/31/15 0349 11/01/15 0538 10/18/2015 0252  AST 30  --   --   --   --   --   ALT 8*  --   --   --   --   --   ALKPHOS 521*  --   --   --   --   --   BILITOT 1.0  --   --   --   --   --   PROT 5.2*  --   --   --   --   --   ALBUMIN 1.0* <1.0* 1.0* <1.0* <1.0* <1.0*   No results for input(s): LIPASE, AMYLASE in the last 168 hours. No results for input(s): AMMONIA in the last 168 hours. Coagulation Profile: No results for input(s): INR, PROTIME in the last 168 hours. Cardiac Enzymes:  Recent  Labs Lab 11/01/15 0538 11/01/15 1331 11/01/15 1944 10/14/2015 0251 10/08/2015 0800  TROPONINI 0.32* 0.54* 0.13* 0.11* 0.39*   BNP (last 3 results) No results for input(s): PROBNP in the last 8760 hours. HbA1C: No results for input(s): HGBA1C in the last 72 hours. CBG:  Recent Labs Lab 11/01/15 0323   11/01/15 0753 11/01/15 1630 11/01/15 2130 10/14/2015 0743  GLUCAP 193* 175* 175* 138* 187*   Lipid Profile:  Recent Labs  11/01/15 1331  CHOL 140  HDL <10*  LDLCALC NOT CALCULATED  TRIG 217*  CHOLHDL NOT CALCULATED   Thyroid Function Tests: No results for input(s): TSH, T4TOTAL, FREET4, T3FREE, THYROIDAB in the last 72 hours. Anemia Panel: No results for input(s): VITAMINB12, FOLATE, FERRITIN, TIBC, IRON, RETICCTPCT in the last 72 hours. Urine analysis:    Component Value Date/Time   COLORURINE YELLOW 10/29/2015 1020   APPEARANCEUR TURBID* 10/29/2015 1020   LABSPEC 1.026 10/29/2015 1020   PHURINE 5.0 10/29/2015 1020   GLUCOSEU NEGATIVE 10/29/2015 1020   HGBUR MODERATE* 10/29/2015 1020   BILIRUBINUR NEGATIVE 10/29/2015 1020   KETONESUR NEGATIVE 10/29/2015 1020   PROTEINUR 100* 10/29/2015 1020   UROBILINOGEN 1.0 08/20/2014 1042   NITRITE NEGATIVE 10/29/2015 1020   LEUKOCYTESUR MODERATE* 10/29/2015 1020   Recent Results (from the past 240 hour(s))  Culture, blood (Routine X 2) w Reflex to ID Panel     Status: None   Collection Time: 10/12/2015  7:52 PM  Result Value Ref Range Status   Specimen Description BLOOD LEFT ANTECUBITAL  Final   Special Requests BOTTLES DRAWN AEROBIC ONLY 10CC  Final   Culture NO GROWTH 5 DAYS  Final   Report Status 10/31/2015 FINAL  Final  Culture, blood (Routine X 2) w Reflex to ID Panel     Status: None   Collection Time: 10/09/2015  8:00 PM  Result Value Ref Range Status   Specimen Description BLOOD LEFT FOREARM  Final   Special Requests BOTTLES DRAWN AEROBIC ONLY 9CC  Final   Culture NO GROWTH 5 DAYS  Final   Report Status 10/31/2015  FINAL  Final  Surgical pcr screen     Status: None   Collection Time: 10/15/2015  8:27 AM  Result Value Ref Range Status   MRSA, PCR NEGATIVE NEGATIVE Final   Staphylococcus aureus NEGATIVE NEGATIVE Final    Comment:        The Xpert SA Assay (FDA approved for NASAL specimens in patients over 21 years of age), is one component of a comprehensive surveillance program.  Test performance has been validated by [facility] for patients greater than or equal to 1 year old. It is not intended to diagnose infection nor to guide or monitor treatment.   Anaerobic culture     Status: None   Collection Time: 10/29/2015 11:27 AM  Result Value Ref Range Status   Specimen Description ABSCESS BUTTOCKS LEFT  Final   Special Requests NONE  Final   Gram Stain   Final    MODERATE WBC PRESENT,BOTH PMN AND MONONUCLEAR NO SQUAMOUS EPITHELIAL CELLS SEEN FEW GRAM POSITIVE COCCI IN PAIRS Performed at [facility]    Culture   Final    NO ANAEROBES ISOLATED Performed at [facility]    Report Status 11/01/2015 FINAL  Final  Culture, routine-abscess     Status: None   Collection Time: 10/10/2015 11:27 AM  Result Value Ref Range Status   Specimen Description ABSCESS BUTTOCKS LEFT  Final   Special Requests NONE  Final   Gram Stain   Final    MODERATE WBC PRESENT,BOTH PMN AND MONONUCLEAR NO SQUAMOUS EPITHELIAL CELLS SEEN FEW GRAM POSITIVE COCCI IN PAIRS Performed at [facility]    Culture   Final    MODERATE GROUP B STREP(S.AGALACTIAE)ISOLATED Note: Beta hemolytic streptococci are predictably susceptible to penicillin   and other beta lactams. Susceptibility testing not routinely performed. Performed at [facility]    Report Status 10/30/2015 FINAL  Final  C difficile quick scan w PCR reflex     Status: None   Collection Time: 10/29/15  4:34 PM  Result Value Ref Range Status   C Diff antigen NEGATIVE NEGATIVE Final   C Diff toxin NEGATIVE NEGATIVE Final   C Diff  interpretation Negative for toxigenic C. difficile  Final  Culture, Urine     Status: None   Collection Time: 10/29/15  7:36 PM  Result Value Ref Range Status   Specimen Description URINE, RANDOM  Final   Special Requests NONE  Final   Culture NO GROWTH 1 DAY  Final   Report Status 10/30/2015 FINAL  Final  MRSA PCR Screening     Status: None   Collection Time: 10/30/15  7:24 PM  Result Value Ref Range Status   MRSA by PCR NEGATIVE NEGATIVE Final    Comment:        The GeneXpert MRSA Assay (FDA approved for NASAL specimens only), is one component of a comprehensive MRSA colonization surveillance program. It is not intended to diagnose MRSA infection nor to guide or monitor treatment for MRSA infections.   Culture, blood (routine x 2)     Status: None (Preliminary result)   Collection Time: 11/01/15 12:20 AM  Result Value Ref Range Status   Specimen Description BLOOD RIGHT ARM  Final   Special Requests IN PEDIATRIC BOTTLE 3ML  Final   Culture NO GROWTH 1 DAY  Final   Report Status PENDING  Incomplete  Culture, blood (routine x 2)     Status: None (Preliminary result)   Collection Time: 11/01/15 12:46 AM  Result Value Ref Range Status   Specimen Description BLOOD LEFT HAND  Final   Special Requests BOTTLES DRAWN AEROBIC AND ANAEROBIC 5ML  Final   Culture NO GROWTH 1 DAY  Final   Report Status PENDING  Incomplete      Anti-infectives    Start     Dose/Rate Route Frequency Ordered Stop   10/31/15 1300  [MAR Hold]  ampicillin-sulbactam (UNASYN) 1.5 g in sodium chloride 0.9 % 50 mL IVPB     (MAR Hold since 10/12/2015 1251)   1.5 g 100 mL/hr over 30 Minutes Intravenous Every 12 hours 10/31/15 1214     10/29/15 2000  metroNIDAZOLE (FLAGYL) IVPB 500 mg  Status:  Discontinued     500 mg 100 mL/hr over 60 Minutes Intravenous Every 8 hours 10/29/15 1852 10/30/15 1312   10/29/15 0800  vancomycin (VANCOCIN) IVPB 750 mg/150 ml premix  Status:  Discontinued     750 mg 150 mL/hr over 60  Minutes Intravenous Every 24 hours 10/28/15 1058 10/31/15 0750   10/24/2015 0800  vancomycin (VANCOCIN) 500 mg in sodium chloride 0.9 % 100 mL IVPB  Status:  Discontinued     500 mg 100 mL/hr over 60 Minutes Intravenous Every 12 hours 10/15/2015 1931 10/28/15 1058   10/25/2015 0200  piperacillin-tazobactam (ZOSYN) IVPB 3.375 g  Status:  Discontinued     3.375 g 12.5 mL/hr over 240 Minutes Intravenous Every 8 hours 11/03/2015 1938 10/31/15 1205   10/20/2015 2000  piperacillin-tazobactam (ZOSYN) IVPB 3.375 g     3.375 g 100 mL/hr over 30 Minutes Intravenous  Once 10/20/2015 1903 10/09/2015 2117   10/15/2015 2000  vancomycin (VANCOCIN) IVPB 1000 mg/200 mL premix     1,000 mg 200 mL/hr over 60 Minutes   Intravenous  Once 10/22/2015 1903 10/08/2015 2147       Radiology Studies: Mr Brain Wo Contrast  10/31/2015  CLINICAL DATA:  Recent drainage of left gluteal abscess. Now with leg weakness. Diabetes EXAM: MRI HEAD WITHOUT CONTRAST MRI CERVICAL SPINE WITHOUT CONTRAST TECHNIQUE: Multiplanar, multiecho pulse sequences of the brain and surrounding structures, and cervical spine, to include the craniocervical junction and cervicothoracic junction, were obtained without intravenous contrast. COMPARISON:  MRI 08/20/2014 FINDINGS: MRI HEAD FINDINGS Multiple small foci of restricted diffusion in the cerebral hemispheres bilaterally. These are present in the posterior temporal lobe bilaterally, the left parietal periventricular white matter, frontal lobes bilaterally. Sparing of the brainstem and cerebellum. 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 Ventricle size normal.  Cerebral volume normal. Negative for intracranial hemorrhage. Negative for mass or edema. No shift of the midline structures. Mucosal thickening throughout the paranasal sinuses. Mastoid sinus effusion bilaterally. Pituitary and skull base normal.  Circle Willis patent. MRI CERVICAL SPINE FINDINGS Image quality  degraded by motion. Straightening of the cervical lordosis with mild kyphosis. Normal alignment. Negative for fracture or mass Spinal cord signal is normal.  No cord compression or cord lesion. Mild cervical degenerative change. No disc protrusion or spinal stenosis. Diffusion-weighted imaging of the cord negative for infarct. 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 Negative for intracranial hemorrhage or mass No acute abnormality in the cervical spine. Negative for spinal stenosis. Electronically Signed   By: Charles  Clark M.D.   On: 10/31/2015 21:11   Mr Cervical Spine Wo Contrast  10/31/2015  CLINICAL DATA:  Recent drainage of left gluteal abscess. Now with leg weakness. Diabetes EXAM: MRI HEAD WITHOUT CONTRAST MRI CERVICAL SPINE WITHOUT CONTRAST TECHNIQUE: Multiplanar, multiecho pulse sequences of the brain and surrounding structures, and cervical spine, to include the craniocervical junction and cervicothoracic junction, were obtained without intravenous contrast. COMPARISON:  MRI 08/20/2014 FINDINGS: MRI HEAD FINDINGS Multiple small foci of restricted diffusion in the cerebral hemispheres bilaterally. These are present in the posterior temporal lobe bilaterally, the left parietal periventricular white matter, frontal lobes bilaterally. Sparing of the brainstem and cerebellum. 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 Ventricle size normal.  Cerebral volume normal. Negative for intracranial hemorrhage. Negative for mass or edema. No shift of the midline structures. Mucosal thickening throughout the paranasal sinuses. Mastoid sinus effusion bilaterally. Pituitary and skull base normal.  Circle Willis patent. MRI CERVICAL SPINE FINDINGS Image quality degraded by motion. Straightening of the cervical lordosis with mild kyphosis. Normal alignment. Negative for fracture or mass  Spinal cord signal is normal.  No cord compression or cord lesion. Mild cervical degenerative change. No disc protrusion or spinal stenosis. Diffusion-weighted imaging of the cord negative for infarct. 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 Negative for intracranial hemorrhage or mass No acute abnormality in the cervical spine. Negative for spinal stenosis. Electronically Signed   By: Charles  Clark M.D.   On: 10/31/2015 21:11   Mr Thoracic Spine Wo Contrast  11/01/2015  CLINICAL DATA:  Sepsis and end-stage renal disease patient. Weakness. The patient is unable to move his left upper extremity. No known injury. Initial encounter. EXAM: MRI THORACIC SPINE WITHOUT CONTRAST TECHNIQUE: Multiplanar, multisequence MR imaging of the thoracic spine was performed. No intravenous contrast was administered. COMPARISON:  CT abdomen and pelvis 10/29/2015   reviewed. FINDINGS: Vertebral body height and alignment are maintained. No worrisome marrow lesion is identified. Marrow signal is somewhat decreased on T1 weighted imaging likely related to marrow stimulation in association with dialysis. There is no evidence of discitis or osteomyelitis. No epidural abscess is identified. The thoracic cord demonstrates normal signal throughout. Disc height and hydration are maintained at all levels. Paraspinous structures demonstrate bilateral pleural effusions which appear small to moderate. IMPRESSION: Normal-appearing thoracic spine. Negative for evidence of infection. Small moderate bilateral pleural effusions. Electronically Signed   By: Thomas  Dalessio M.D.   On: 11/01/2015 13:56        Scheduled Meds: . [MAR Hold] ampicillin-sulbactam (UNASYN) IV  1.5 g Intravenous Q12H  . [MAR Hold] aspirin  325 mg Oral QHS  . [MAR Hold] atorvastatin  80 mg Oral q1800  . [MAR Hold] cholecalciferol  2,000 Units Oral Daily  . [MAR Hold] docusate sodium   100 mg Oral BID  . [MAR Hold] insulin aspart  0-9 Units Subcutaneous TID WC  . [MAR Hold] insulin glargine  15 Units Subcutaneous QHS  . [MAR Hold] thiamine  100 mg Oral Daily   Continuous Infusions: . sodium chloride 20 mL/hr (11/01/15 1652)  . heparin 1,100 Units/hr (10/31/2015 0449)     LOS: 7 days    Time spent: 25 min    Ryenn Howeth, DO [facility] Pager [phone]  If 7PM-7AM, please contact night-coverage [email or web address] Password [email or web address] 10/22/2015, 1:11 PM    

## 2015-11-02 NOTE — Progress Notes (Signed)
DAILY PROGRESS NOTE  Subjective:  No events overnight. Chest pain has resolved. Plan for TEE today with Dr. Marlou Porch at 1400 to evaluate for endocarditis. Troponin is now trending down. On IV heparin. Creatinine improved today to 2.8. H/H up to 9/28 today. Cholesterol very low - HDL <10, TC 140. WBC count remains high at 43K.  Objective:  Temp:  [98 F (36.7 C)-100 F (37.8 C)] 98 F (36.7 C) (04/27 0745) Pulse Rate:  [78-102] 78 (04/27 0745) Resp:  [9-19] 9 (04/27 0745) BP: (108-131)/(59-88) 109/74 mmHg (04/27 0745) SpO2:  [96 %-99 %] 99 % (04/27 0745) Weight change:   Intake/Output from previous day: 04/26 0701 - 04/27 0700 In: 1460.9 [P.O.:680; I.V.:680.9; IV Piggyback:100] Out: 975 [Urine:975]  Intake/Output from this shift:    Medications: Current Facility-Administered Medications  Medication Dose Route Frequency Provider Last Rate Last Dose  . 0.9 %  sodium chloride infusion   Intravenous Continuous Arbutus Leas, NP 20 mL/hr at 11/01/15 1652 20 mL/hr at 11/01/15 1652  . ampicillin-sulbactam (UNASYN) 1.5 g in sodium chloride 0.9 % 50 mL IVPB  1.5 g Intravenous Q12H Jake Church Masters, RPH   1.5 g at 10/31/2015 0120  . aspirin tablet 325 mg  325 mg Oral QHS Sharl Ma, MD   325 mg at 11/01/15 2147  . atorvastatin (LIPITOR) tablet 80 mg  80 mg Oral q1800 Pixie Casino, MD   80 mg at 11/01/15 1746  . cholecalciferol (VITAMIN D) tablet 2,000 Units  2,000 Units Oral Daily Geradine Girt, DO   2,000 Units at 11/01/15 1001  . docusate sodium (COLACE) capsule 100 mg  100 mg Oral BID Geradine Girt, DO   100 mg at 10/31/15 2257  . heparin ADULT infusion 100 units/mL (25000 units/250 mL)  1,100 Units/hr Intravenous Continuous Erenest Blank, RPH 11 mL/hr at 10/19/2015 0449 1,100 Units/hr at 10/12/2015 0449  . HYDROcodone-acetaminophen (NORCO/VICODIN) 5-325 MG per tablet 1-2 tablet  1-2 tablet Oral Q4H PRN Geradine Girt, DO   2 tablet at 10/18/2015 0620  . insulin aspart (novoLOG)  injection 0-9 Units  0-9 Units Subcutaneous TID WC Geradine Girt, DO   2 Units at 11/01/15 1747  . insulin glargine (LANTUS) injection 15 Units  15 Units Subcutaneous QHS Geradine Girt, DO   15 Units at 11/01/15 2147  . ondansetron (ZOFRAN) tablet 4 mg  4 mg Oral Q6H PRN Geradine Girt, DO       Or  . ondansetron (ZOFRAN) injection 4 mg  4 mg Intravenous Q6H PRN Geradine Girt, DO   4 mg at 10/28/15 0104  . senna-docusate (Senokot-S) tablet 1 tablet  1 tablet Oral QHS PRN Geradine Girt, DO   1 tablet at 10/28/2015 2047  . thiamine (VITAMIN B-1) tablet 100 mg  100 mg Oral Daily Jake Church Masters, RPH   100 mg at 11/01/15 7903    Physical Exam: General appearance: alert, no distress and lying supine, in no distress Neck: no carotid bruit and no JVD Lungs: clear to auscultation bilaterally Heart: regular rate and rhythm Abdomen: soft, non-tender; bowel sounds normal; no masses,  no organomegaly Extremities: extremities normal, atraumatic, no cyanosis or edema Pulses: 2+ and symmetric Skin: Skin color, texture, turgor normal. No rashes or lesions Neurologic: Mental status: Alert, oriented, thought content appropriate, some movement of the RLE and RUE Psych: Pleasant  Lab Results: Results for orders placed or performed during the hospital encounter of 10/30/2015 (from  the past 48 hour(s))  Glucose, capillary     Status: Abnormal   Collection Time: 10/31/15  8:46 AM  Result Value Ref Range   Glucose-Capillary 160 (H) 65 - 99 mg/dL  C3 complement     Status: None   Collection Time: 10/31/15 11:55 AM  Result Value Ref Range   C3 Complement 158 82 - 167 mg/dL    Comment: (NOTE) Performed At: [facility] [place], [place] [id number] Lindon Romp MD [id number]   C4 complement     Status: None   Collection Time: 10/31/15 11:55 AM  Result Value Ref Range   Complement C4, Body Fluid 34 14 - 44 mg/dL    Comment: (NOTE) Performed At: [facility] [place], [place] [id number] Lindon Romp MD [id number]   Complement, total     Status: Abnormal   Collection Time: 10/31/15 11:55 AM  Result Value Ref Range   Compl, Total (CH50) >60 (H) 42 - 60 U/mL    Comment: (NOTE) Performed At: [facility] [place], [place] [id number] Lindon Romp MD [id number]   Antistreptolysin O titer     Status: None   Collection Time: 10/31/15 11:55 AM  Result Value Ref Range   ASO <20.0 0.0 - 200.0 IU/mL    Comment: (NOTE) Performed At: [facility] [place], [place] [id number] Lindon Romp MD [id number]   Glucose, capillary     Status: Abnormal   Collection Time: 10/31/15 12:01 PM  Result Value Ref Range   Glucose-Capillary 116 (H) 65 - 99 mg/dL   Comment 1 Document in Chart   Glucose, capillary     Status: Abnormal   Collection Time: 10/31/15  5:54 PM  Result Value Ref Range   Glucose-Capillary 159 (H) 65 - 99 mg/dL  Troponin I (q 6hr x 3)     Status: Abnormal   Collection Time: 10/31/15  6:55 PM  Result Value Ref Range   Troponin I 0.08 (H) <0.031 ng/mL    Comment:        PERSISTENTLY INCREASED TROPONIN VALUES IN THE RANGE OF 0.04-0.49 ng/mL CAN BE SEEN IN:       -UNSTABLE ANGINA       -CONGESTIVE HEART FAILURE       -MYOCARDITIS       -CHEST TRAUMA       -ARRYHTHMIAS       -LATE PRESENTING MYOCARDIAL INFARCTION       -COPD   CLINICAL FOLLOW-UP RECOMMENDED.   Troponin I (q 6hr x 3)     Status: Abnormal   Collection Time: 10/31/15 10:44 PM  Result Value Ref Range   Troponin I 0.20 (H) <0.031 ng/mL    Comment:        PERSISTENTLY INCREASED TROPONIN VALUES IN THE RANGE OF 0.04-0.49 ng/mL CAN BE SEEN IN:       -UNSTABLE ANGINA       -CONGESTIVE HEART FAILURE       -MYOCARDITIS       -CHEST TRAUMA       -ARRYHTHMIAS       -LATE PRESENTING MYOCARDIAL INFARCTION       -COPD   CLINICAL FOLLOW-UP RECOMMENDED.   Glucose, capillary     Status: Abnormal    Collection Time: 10/31/15 10:58 PM  Result Value Ref Range   Glucose-Capillary 190 (H) 65 - 99 mg/dL  Glucose, capillary     Status: Abnormal  Collection Time: 11/01/15  3:23 AM  Result Value Ref Range   Glucose-Capillary 193 (H) 65 - 99 mg/dL  Renal function panel     Status: Abnormal   Collection Time: 11/01/15  5:38 AM  Result Value Ref Range   Sodium 130 (L) 135 - 145 mmol/L   Potassium 3.0 (L) 3.5 - 5.1 mmol/L   Chloride 102 101 - 111 mmol/L   CO2 15 (L) 22 - 32 mmol/L   Glucose, Bld 203 (H) 65 - 99 mg/dL   BUN 47 (H) 6 - 20 mg/dL   Creatinine, Ser 3.63 (H) 0.61 - 1.24 mg/dL   Calcium 7.1 (L) 8.9 - 10.3 mg/dL   Phosphorus 6.0 (H) 2.5 - 4.6 mg/dL   Albumin <1.0 (L) 3.5 - 5.0 g/dL   GFR calc non Af Amer 19 (L) >60 mL/min   GFR calc Af Amer 23 (L) >60 mL/min    Comment: (NOTE) The eGFR has been calculated using the CKD EPI equation. This calculation has not been validated in all clinical situations. eGFR's persistently <60 mL/min signify possible Chronic Kidney Disease.    Anion gap 13 5 - 15  CBC with Differential/Platelet     Status: Abnormal   Collection Time: 11/01/15  5:38 AM  Result Value Ref Range   WBC 50.6 (HH) 4.0 - 10.5 K/uL    Comment: CRITICAL VALUE NOTED.  VALUE IS CONSISTENT WITH PREVIOUSLY REPORTED AND CALLED VALUE. REPEATED TO VERIFY    RBC 3.25 (L) 4.22 - 5.81 MIL/uL   Hemoglobin 8.6 (L) 13.0 - 17.0 g/dL   HCT 25.2 (L) 39.0 - 52.0 %   MCV 77.5 (L) 78.0 - 100.0 fL   MCH 26.5 26.0 - 34.0 pg   MCHC 34.1 30.0 - 36.0 g/dL   RDW 14.3 11.5 - 15.5 %   Platelets 482 (H) 150 - 400 K/uL   Neutrophils Relative % 94 %   Lymphocytes Relative 3 %   Monocytes Relative 3 %   Eosinophils Relative 0 %   Basophils Relative 0 %   Neutro Abs 47.6 (H) 1.7 - 7.7 K/uL   Lymphs Abs 1.5 0.7 - 4.0 K/uL   Monocytes Absolute 1.5 (H) 0.1 - 1.0 K/uL   Eosinophils Absolute 0.0 0.0 - 0.7 K/uL   Basophils Absolute 0.0 0.0 - 0.1 K/uL  Troponin I (q 6hr x 3)     Status: Abnormal    Collection Time: 11/01/15  5:38 AM  Result Value Ref Range   Troponin I 0.32 (H) <0.031 ng/mL    Comment:        PERSISTENTLY INCREASED TROPONIN VALUES IN THE RANGE OF 0.04-0.49 ng/mL CAN BE SEEN IN:       -UNSTABLE ANGINA       -CONGESTIVE HEART FAILURE       -MYOCARDITIS       -CHEST TRAUMA       -ARRYHTHMIAS       -LATE PRESENTING MYOCARDIAL INFARCTION       -COPD   CLINICAL FOLLOW-UP RECOMMENDED.   Glucose, capillary     Status: Abnormal   Collection Time: 11/01/15  7:53 AM  Result Value Ref Range   Glucose-Capillary 175 (H) 65 - 99 mg/dL  Magnesium     Status: None   Collection Time: 11/01/15  1:31 PM  Result Value Ref Range   Magnesium 1.9 1.7 - 2.4 mg/dL  Parathyroid hormone, intact (no Ca)     Status: Abnormal   Collection Time: 11/01/15  1:31 PM  Result Value Ref Range   PTH 76 (H) 15 - 65 pg/mL    Comment: (NOTE) Performed At: [facility] [place], [place] [id number] Lindon Romp MD [id number]   Troponin I (q 6hr x 3)     Status: Abnormal   Collection Time: 11/01/15  1:31 PM  Result Value Ref Range   Troponin I 0.54 (HH) <0.031 ng/mL    Comment:        POSSIBLE MYOCARDIAL ISCHEMIA. SERIAL TESTING RECOMMENDED. CRITICAL RESULT CALLED TO, READ BACK BY AND VERIFIED WITH: TROYCE HOOD,RN AT 3329 11/01/15 BY ZBEECH.   Lipid panel     Status: Abnormal   Collection Time: 11/01/15  1:31 PM  Result Value Ref Range   Cholesterol 140 0 - 200 mg/dL   Triglycerides 217 (H) <150 mg/dL   HDL <10 (L) >40 mg/dL   Total CHOL/HDL Ratio NOT CALCULATED RATIO   VLDL 43 (H) 0 - 40 mg/dL   LDL Cholesterol NOT CALCULATED 0 - 99 mg/dL  Glucose, capillary     Status: Abnormal   Collection Time: 11/01/15  4:30 PM  Result Value Ref Range   Glucose-Capillary 175 (H) 65 - 99 mg/dL  Troponin I (q 6hr x 3)     Status: Abnormal   Collection Time: 11/01/15  7:44 PM  Result Value Ref Range   Troponin I 0.13 (H) <0.031 ng/mL    Comment:          PERSISTENTLY INCREASED TROPONIN VALUES IN THE RANGE OF 0.04-0.49 ng/mL CAN BE SEEN IN:       -UNSTABLE ANGINA       -CONGESTIVE HEART FAILURE       -MYOCARDITIS       -CHEST TRAUMA       -ARRYHTHMIAS       -LATE PRESENTING MYOCARDIAL INFARCTION       -COPD   CLINICAL FOLLOW-UP RECOMMENDED.   Glucose, capillary     Status: Abnormal   Collection Time: 11/01/15  9:30 PM  Result Value Ref Range   Glucose-Capillary 138 (H) 65 - 99 mg/dL  Troponin I (q 6hr x 3)     Status: Abnormal   Collection Time: 10/12/2015  2:51 AM  Result Value Ref Range   Troponin I 0.11 (H) <0.031 ng/mL    Comment:        PERSISTENTLY INCREASED TROPONIN VALUES IN THE RANGE OF 0.04-0.49 ng/mL CAN BE SEEN IN:       -UNSTABLE ANGINA       -CONGESTIVE HEART FAILURE       -MYOCARDITIS       -CHEST TRAUMA       -ARRYHTHMIAS       -LATE PRESENTING MYOCARDIAL INFARCTION       -COPD   CLINICAL FOLLOW-UP RECOMMENDED.   Heparin level (unfractionated)     Status: Abnormal   Collection Time: 10/28/2015  2:51 AM  Result Value Ref Range   Heparin Unfractionated <0.10 (L) 0.30 - 0.70 IU/mL    Comment:        IF HEPARIN RESULTS ARE BELOW EXPECTED VALUES, AND PATIENT DOSAGE HAS BEEN CONFIRMED, SUGGEST FOLLOW UP TESTING OF ANTITHROMBIN III LEVELS.   Renal function panel     Status: Abnormal   Collection Time: 11/01/2015  2:52 AM  Result Value Ref Range   Sodium 131 (L) 135 - 145 mmol/L   Potassium 3.4 (L) 3.5 - 5.1 mmol/L   Chloride 104 101 - 111 mmol/L   CO2 17 (  L) 22 - 32 mmol/L   Glucose, Bld 245 (H) 65 - 99 mg/dL   BUN 45 (H) 6 - 20 mg/dL   Creatinine, Ser 2.80 (H) 0.61 - 1.24 mg/dL   Calcium 7.3 (L) 8.9 - 10.3 mg/dL   Phosphorus 4.6 2.5 - 4.6 mg/dL   Albumin <1.0 (L) 3.5 - 5.0 g/dL   GFR calc non Af Amer 27 (L) >60 mL/min   GFR calc Af Amer 31 (L) >60 mL/min    Comment: (NOTE) The eGFR has been calculated using the CKD EPI equation. This calculation has not been validated in all clinical  situations. eGFR's persistently <60 mL/min signify possible Chronic Kidney Disease.    Anion gap 10 5 - 15  CBC     Status: Abnormal   Collection Time: 10/21/2015  2:52 AM  Result Value Ref Range   WBC 42.6 (H) 4.0 - 10.5 K/uL   RBC 3.56 (L) 4.22 - 5.81 MIL/uL   Hemoglobin 9.2 (L) 13.0 - 17.0 g/dL   HCT 27.5 (L) 39.0 - 52.0 %   MCV 77.2 (L) 78.0 - 100.0 fL   MCH 25.8 (L) 26.0 - 34.0 pg   MCHC 33.5 30.0 - 36.0 g/dL   RDW 14.1 11.5 - 15.5 %   Platelets 450 (H) 150 - 400 K/uL  Glucose, capillary     Status: Abnormal   Collection Time: 10/07/2015  7:43 AM  Result Value Ref Range   Glucose-Capillary 187 (H) 65 - 99 mg/dL    Imaging: Mr Brain Wo Contrast  10/31/2015  CLINICAL DATA:  Recent drainage of left gluteal abscess. Now with leg weakness. Diabetes EXAM: MRI HEAD WITHOUT CONTRAST MRI CERVICAL SPINE WITHOUT CONTRAST TECHNIQUE: Multiplanar, multiecho pulse sequences of the brain and surrounding structures, and cervical spine, to include the craniocervical junction and cervicothoracic junction, were obtained without intravenous contrast. COMPARISON:  MRI 08/20/2014 FINDINGS: MRI HEAD FINDINGS Multiple small foci of restricted diffusion in the cerebral hemispheres bilaterally. These are present in the posterior temporal lobe bilaterally, the left parietal periventricular white matter, frontal lobes bilaterally. Sparing of the brainstem and cerebellum. 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 Ventricle size normal.  Cerebral volume normal. Negative for intracranial hemorrhage. Negative for mass or edema. No shift of the midline structures. Mucosal thickening throughout the paranasal sinuses. Mastoid sinus effusion bilaterally. Pituitary and skull base normal.  Circle Willis patent. MRI CERVICAL SPINE FINDINGS Image quality degraded by motion. Straightening of the cervical lordosis with mild kyphosis. Normal alignment. Negative for fracture or mass  Spinal cord signal is normal.  No cord compression or cord lesion. Mild cervical degenerative change. No disc protrusion or spinal stenosis. Diffusion-weighted imaging of the cord negative for infarct. 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 Negative for intracranial hemorrhage or mass No acute abnormality in the cervical spine. Negative for spinal stenosis. Electronically Signed   By: Franchot Gallo M.D.   On: 10/31/2015 21:11   Mr Cervical Spine Wo Contrast  10/31/2015  CLINICAL DATA:  Recent drainage of left gluteal abscess. Now with leg weakness. Diabetes EXAM: MRI HEAD WITHOUT CONTRAST MRI CERVICAL SPINE WITHOUT CONTRAST TECHNIQUE: Multiplanar, multiecho pulse sequences of the brain and surrounding structures, and cervical spine, to include the craniocervical junction and cervicothoracic junction, were obtained without intravenous contrast. COMPARISON:  MRI 08/20/2014 FINDINGS: MRI HEAD FINDINGS Multiple small foci of restricted diffusion in the cerebral hemispheres bilaterally. These  are present in the posterior temporal lobe bilaterally, the left parietal periventricular white matter, frontal lobes bilaterally. Sparing of the brainstem and cerebellum. 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 Ventricle size normal.  Cerebral volume normal. Negative for intracranial hemorrhage. Negative for mass or edema. No shift of the midline structures. Mucosal thickening throughout the paranasal sinuses. Mastoid sinus effusion bilaterally. Pituitary and skull base normal.  Circle Willis patent. MRI CERVICAL SPINE FINDINGS Image quality degraded by motion. Straightening of the cervical lordosis with mild kyphosis. Normal alignment. Negative for fracture or mass Spinal cord signal is normal.  No cord compression or cord lesion. Mild cervical degenerative change. No disc protrusion or  spinal stenosis. Diffusion-weighted imaging of the cord negative for infarct. 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 Negative for intracranial hemorrhage or mass No acute abnormality in the cervical spine. Negative for spinal stenosis. Electronically Signed   By: Franchot Gallo M.D.   On: 10/31/2015 21:11   Mr Thoracic Spine Wo Contrast  11/01/2015  CLINICAL DATA:  Sepsis and end-stage renal disease patient. Weakness. The patient is unable to move his left upper extremity. No known injury. Initial encounter. EXAM: MRI THORACIC SPINE WITHOUT CONTRAST TECHNIQUE: Multiplanar, multisequence MR imaging of the thoracic spine was performed. No intravenous contrast was administered. COMPARISON:  CT abdomen and pelvis 10/29/2015 reviewed. FINDINGS: Vertebral body height and alignment are maintained. No worrisome marrow lesion is identified. Marrow signal is somewhat decreased on T1 weighted imaging likely related to marrow stimulation in association with dialysis. There is no evidence of discitis or osteomyelitis. No epidural abscess is identified. The thoracic cord demonstrates normal signal throughout. Disc height and hydration are maintained at all levels. Paraspinous structures demonstrate bilateral pleural effusions which appear small to moderate. IMPRESSION: Normal-appearing thoracic spine. Negative for evidence of infection. Small moderate bilateral pleural effusions. Electronically Signed   By: Inge Rise M.D.   On: 11/01/2015 13:56   Mr Lumbar Spine Wo Contrast  10/31/2015  CLINICAL DATA:  Leukocytosis and fever with perirectal abscess status post irrigation and drainage x2. Lower extremity weakness with difficulty ambulating. History of chronic kidney disease and diabetes. EXAM: MRI LUMBAR SPINE WITHOUT CONTRAST TECHNIQUE: Multiplanar, multisequence MR imaging of the lumbar spine was performed. No intravenous  contrast was administered. COMPARISON:  Abdominal pelvic CT 10/29/2015 FINDINGS: Segmentation: Conventional anatomy assumed, with the last open disc space designated L5-S1. Alignment:  Normal. Vertebrae: There is generalized decreased T1 and T2 marrow signal throughout the visualized bones which is attributed to chronic kidney disease. No acute osseous findings. No evidence of pars defect or discitis. Small erosions along the sacroiliac joints are likely related to the patient's renal disease and secondary hyperparathyroidism. No evidence of sacroiliac joint effusion to suggest infection. Conus medullaris: Extends to the L1 level and appears normal. No evidence of epidural fluid collection. Paraspinal and other soft tissues: There is extensive edema throughout the paraspinal soft tissues, subcutaneous fat and retroperitoneum. No focal fluid collections are seen to suggest infection. There is free pelvic fluid, similar to recent CT. Disc levels: All of the lumbar discs are well hydrated with maintained height. No evidence of disc herniation, spinal stenosis or nerve root encroachment. No significant facet arthropathy. IMPRESSION: 1. No evidence of spinal infection. Specifically, no evidence of discitis, facet joint effusion or epidural abscess. 2. No disc herniation, spinal stenosis or nerve root encroachment. 3. Extensive soft tissue edema  throughout the subcutaneous, retroperitoneal and peritoneal soft tissues, consistent with anasarca, similar to recent CT. 4. Marrow and sacroiliac changes attributed to underlying renal disease. Electronically Signed   By: Richardean Sale M.D.   On: 10/31/2015 11:30    Assessment:  1. Active Problems: 2.   Hyperglycemia due to type 2 diabetes mellitus ([place]) 3.   Hyponatremia 4.   Essential hypertension 5.   Diabetic neuropathy, type II diabetes mellitus ([place]) 6.   Peri-rectal abscess 7.   CKD (chronic kidney disease) 8.   Leukocytosis 9.   AKI (acute kidney injury)  ([place]) 10.   Pressure ulcer 11.   Abscess, gluteal, left 12.   Precordial pain 13.   NSTEMI (non-ST elevated myocardial infarction) ([place]) 14.   Pericardial effusion 15.   Plan:  1. Chest pain has resolved - troponin is trending down. Continue IV heparin for at least 48 hours. On aspirin, statin. Will likely need ischemia work-up, however, given overall condition, would postpone that pending overall improvement. High risk for endocarditis, although BC are negative - leukocytosis is improving. Plan TEE today. Consider adding low-dose b-blocker tomorrow if BP allows.  Time Spent Directly with Patient:  15 minutes  Length of Stay:  LOS: 7 days   Pixie Casino, MD, [facility] Attending Cardiologist [place] 10/15/2015, 8:32 AM

## 2015-11-02 NOTE — CV Procedure (Signed)
     TEE  Indications: Bacteremia, Stroke  During this procedure the patient is administered a total of Versed 3 mg and Fentanyl 25 mcg to achieve and maintain moderate conscious sedation.  The patient's heart rate, blood pressure, and oxygen saturation are monitored continuously during the procedure. The period of conscious sedation is 20 minutes, of which I was present face-to-face 100% of this time.  Findings:  No vegetations No thrombus Normal EF Mild MR Normal Bubble Study Trivial pericardial effusion  Donato Schultz, MD

## 2015-11-03 ENCOUNTER — Other Ambulatory Visit: Payer: Self-pay | Admitting: Cardiology

## 2015-11-03 DIAGNOSIS — R079 Chest pain, unspecified: Secondary | ICD-10-CM

## 2015-11-03 DIAGNOSIS — I10 Essential (primary) hypertension: Secondary | ICD-10-CM

## 2015-11-03 DIAGNOSIS — E0800 Diabetes mellitus due to underlying condition with hyperosmolarity without nonketotic hyperglycemic-hyperosmolar coma (NKHHC): Secondary | ICD-10-CM

## 2015-11-03 DIAGNOSIS — Z794 Long term (current) use of insulin: Secondary | ICD-10-CM

## 2015-11-03 DIAGNOSIS — R778 Other specified abnormalities of plasma proteins: Secondary | ICD-10-CM

## 2015-11-03 DIAGNOSIS — R7989 Other specified abnormal findings of blood chemistry: Secondary | ICD-10-CM

## 2015-11-03 LAB — RENAL FUNCTION PANEL
ANION GAP: 11 (ref 5–15)
Albumin: 1 g/dL — ABNORMAL LOW (ref 3.5–5.0)
BUN: 41 mg/dL — ABNORMAL HIGH (ref 6–20)
CHLORIDE: 106 mmol/L (ref 101–111)
CO2: 16 mmol/L — AB (ref 22–32)
Calcium: 7.4 mg/dL — ABNORMAL LOW (ref 8.9–10.3)
Creatinine, Ser: 2.06 mg/dL — ABNORMAL HIGH (ref 0.61–1.24)
GFR calc non Af Amer: 39 mL/min — ABNORMAL LOW (ref >60)
GFR, EST AFRICAN AMERICAN: 45 mL/min — AB (ref >60)
Glucose, Bld: 212 mg/dL — ABNORMAL HIGH (ref 65–99)
POTASSIUM: 3.5 mmol/L (ref 3.5–5.1)
Phosphorus: 3.7 mg/dL (ref 2.5–4.6)
Sodium: 133 mmol/L — ABNORMAL LOW (ref 135–145)

## 2015-11-03 LAB — HEPARIN LEVEL (UNFRACTIONATED): HEPARIN UNFRACTIONATED: 0.46 [IU]/mL (ref 0.30–0.70)

## 2015-11-03 LAB — CBC
HEMATOCRIT: 23.9 % — AB (ref 39.0–52.0)
HEMOGLOBIN: 8 g/dL — AB (ref 13.0–17.0)
MCH: 26.1 pg (ref 26.0–34.0)
MCHC: 33.5 g/dL (ref 30.0–36.0)
MCV: 78.1 fL (ref 78.0–100.0)
PLATELETS: 484 10*3/uL — AB (ref 150–400)
RBC: 3.06 MIL/uL — AB (ref 4.22–5.81)
RDW: 14.5 % (ref 11.5–15.5)
WBC: 40.2 10*3/uL — AB (ref 4.0–10.5)

## 2015-11-03 LAB — GLUCOSE, CAPILLARY
GLUCOSE-CAPILLARY: 182 mg/dL — AB (ref 65–99)
Glucose-Capillary: 183 mg/dL — ABNORMAL HIGH (ref 65–99)
Glucose-Capillary: 212 mg/dL — ABNORMAL HIGH (ref 65–99)
Glucose-Capillary: 205 mg/dL — ABNORMAL HIGH (ref 65–99)

## 2015-11-03 MED ORDER — CARVEDILOL 6.25 MG PO TABS
6.2500 mg | ORAL_TABLET | Freq: Two times a day (BID) | ORAL | Status: DC
  Administered 2015-11-04 – 2015-11-13 (×20): 6.25 mg via ORAL
  Filled 2015-11-03 (×20): qty 1

## 2015-11-03 MED ORDER — CARVEDILOL 6.25 MG PO TABS
6.2500 mg | ORAL_TABLET | Freq: Two times a day (BID) | ORAL | Status: DC
  Administered 2015-11-03 (×2): 6.25 mg via ORAL
  Filled 2015-11-03 (×2): qty 1

## 2015-11-03 MED ORDER — FUROSEMIDE 20 MG PO TABS
20.0000 mg | ORAL_TABLET | Freq: Every day | ORAL | Status: DC
  Administered 2015-11-03 – 2015-11-07 (×5): 20 mg via ORAL
  Filled 2015-11-03 (×5): qty 1

## 2015-11-03 MED ORDER — HEPARIN SODIUM (PORCINE) 5000 UNIT/ML IJ SOLN
5000.0000 [IU] | Freq: Three times a day (TID) | INTRAMUSCULAR | Status: DC
  Administered 2015-11-03 – 2015-11-14 (×29): 5000 [IU] via SUBCUTANEOUS
  Filled 2015-11-03 (×30): qty 1

## 2015-11-03 MED ORDER — DARBEPOETIN ALFA 60 MCG/0.3ML IJ SOSY
60.0000 ug | PREFILLED_SYRINGE | INTRAMUSCULAR | Status: DC
  Administered 2015-11-03: 60 ug via SUBCUTANEOUS
  Filled 2015-11-03: qty 0.3

## 2015-11-03 MED ORDER — SODIUM CHLORIDE 0.9 % IV SOLN
3.0000 g | Freq: Three times a day (TID) | INTRAVENOUS | Status: DC
  Administered 2015-11-03 – 2015-11-14 (×32): 3 g via INTRAVENOUS
  Filled 2015-11-03 (×40): qty 3

## 2015-11-03 NOTE — Progress Notes (Signed)
DAILY PROGRESS NOTE  Subjective:  Chest pain resolved. Renal function improving - marked leukocytosis is improving. Neurology has signed off. TEE yesterday shows no findings to support infective endocarditis. No evidence for LV thrombus. Troponin has trended down.  Objective:  Temp:  [97.9 F (36.6 C)-98.6 F (37 C)] 97.9 F (36.6 C) (04/28 0802) Pulse Rate:  [79-99] 98 (04/28 0802) Resp:  [10-39] 21 (04/28 0802) BP: (118-161)/(54-85) 161/78 mmHg (04/28 0802) SpO2:  [95 %-100 %] 97 % (04/28 0802) Weight change:   Intake/Output from previous day: 04/27 0701 - 04/28 0700 In: 1174 [P.O.:840; I.V.:284; IV Piggyback:50] Out: 1610 [Urine:1275]  Intake/Output from this shift: Total I/O In: 181.3 [I.V.:181.3] Out: -   Medications: Current Facility-Administered Medications  Medication Dose Route Frequency Provider Last Rate Last Dose  . ampicillin-sulbactam (UNASYN) 1.5 g in sodium chloride 0.9 % 50 mL IVPB  1.5 g Intravenous Q12H Jake Church Masters, RPH   1.5 g at 11/03/15 0014  . aspirin tablet 325 mg  325 mg Oral QHS Sharl Ma, MD   325 mg at 10/08/2015 2110  . atorvastatin (LIPITOR) tablet 80 mg  80 mg Oral q1800 Pixie Casino, MD   80 mg at 10/19/2015 1646  . cholecalciferol (VITAMIN D) tablet 2,000 Units  2,000 Units Oral Daily Geradine Girt, DO   2,000 Units at 10/17/2015 0837  . docusate sodium (COLACE) capsule 100 mg  100 mg Oral BID Geradine Girt, DO   100 mg at 10/29/2015 2110  . heparin ADULT infusion 100 units/mL (25000 units/250 mL)  1,550 Units/hr Intravenous Continuous Otilio Miu, RPH 15.5 mL/hr at 10/07/2015 2308 1,550 Units/hr at 10/19/2015 2308  . HYDROcodone-acetaminophen (NORCO/VICODIN) 5-325 MG per tablet 1-2 tablet  1-2 tablet Oral Q4H PRN Geradine Girt, DO   2 tablet at 11/03/15 0818  . insulin aspart (novoLOG) injection 0-9 Units  0-9 Units Subcutaneous TID WC Geradine Girt, DO   2 Units at 11/03/15 0817  . insulin glargine (LANTUS) injection 15 Units   15 Units Subcutaneous QHS Geradine Girt, DO   15 Units at 10/25/2015 2110  . ondansetron (ZOFRAN) tablet 4 mg  4 mg Oral Q6H PRN Geradine Girt, DO       Or  . ondansetron (ZOFRAN) injection 4 mg  4 mg Intravenous Q6H PRN Geradine Girt, DO   4 mg at 10/28/15 0104  . senna-docusate (Senokot-S) tablet 1 tablet  1 tablet Oral QHS PRN Geradine Girt, DO   1 tablet at 10/12/2015 2047  . thiamine (VITAMIN B-1) tablet 100 mg  100 mg Oral Daily Jake Church Masters, RPH   100 mg at 10/12/2015 9604    Physical Exam: General appearance: alert, no distress and lying supine, in no distress Neck: no carotid bruit and no JVD Lungs: clear to auscultation bilaterally Heart: regular rate and rhythm Abdomen: soft, non-tender; bowel sounds normal; no masses,  no organomegaly Extremities: extremities normal, atraumatic, no cyanosis or edema Pulses: 2+ and symmetric Skin: Skin color, texture, turgor normal. No rashes or lesions Neurologic: Mental status: Alert, oriented, thought content appropriate, some movement of the RLE and RUE Psych: Pleasant  Lab Results: Results for orders placed or performed during the hospital encounter of 10/17/2015 (from the past 48 hour(s))  Magnesium     Status: None   Collection Time: 11/01/15  1:31 PM  Result Value Ref Range   Magnesium 1.9 1.7 - 2.4 mg/dL  Parathyroid hormone, intact (no Ca)  Status: Abnormal   Collection Time: 11/01/15  1:31 PM  Result Value Ref Range   PTH 76 (H) 15 - 65 pg/mL    Comment: (NOTE) Performed At: [facility] [place], [place] [id number] Lindon Romp MD [id number]   Troponin I (q 6hr x 3)     Status: Abnormal   Collection Time: 11/01/15  1:31 PM  Result Value Ref Range   Troponin I 0.54 (HH) <0.031 ng/mL    Comment:        POSSIBLE MYOCARDIAL ISCHEMIA. SERIAL TESTING RECOMMENDED. CRITICAL RESULT CALLED TO, READ BACK BY AND VERIFIED WITH: TROYCE HOOD,RN AT 8891 11/01/15 BY ZBEECH.   Lipid panel     Status:  Abnormal   Collection Time: 11/01/15  1:31 PM  Result Value Ref Range   Cholesterol 140 0 - 200 mg/dL   Triglycerides 217 (H) <150 mg/dL   HDL <10 (L) >40 mg/dL   Total CHOL/HDL Ratio NOT CALCULATED RATIO   VLDL 43 (H) 0 - 40 mg/dL   LDL Cholesterol NOT CALCULATED 0 - 99 mg/dL  Glucose, capillary     Status: Abnormal   Collection Time: 11/01/15  4:30 PM  Result Value Ref Range   Glucose-Capillary 175 (H) 65 - 99 mg/dL  Troponin I (q 6hr x 3)     Status: Abnormal   Collection Time: 11/01/15  7:44 PM  Result Value Ref Range   Troponin I 0.13 (H) <0.031 ng/mL    Comment:        PERSISTENTLY INCREASED TROPONIN VALUES IN THE RANGE OF 0.04-0.49 ng/mL CAN BE SEEN IN:       -UNSTABLE ANGINA       -CONGESTIVE HEART FAILURE       -MYOCARDITIS       -CHEST TRAUMA       -ARRYHTHMIAS       -LATE PRESENTING MYOCARDIAL INFARCTION       -COPD   CLINICAL FOLLOW-UP RECOMMENDED.   Glucose, capillary     Status: Abnormal   Collection Time: 11/01/15  9:30 PM  Result Value Ref Range   Glucose-Capillary 138 (H) 65 - 99 mg/dL  Troponin I (q 6hr x 3)     Status: Abnormal   Collection Time: 10/29/2015  2:51 AM  Result Value Ref Range   Troponin I 0.11 (H) <0.031 ng/mL    Comment:        PERSISTENTLY INCREASED TROPONIN VALUES IN THE RANGE OF 0.04-0.49 ng/mL CAN BE SEEN IN:       -UNSTABLE ANGINA       -CONGESTIVE HEART FAILURE       -MYOCARDITIS       -CHEST TRAUMA       -ARRYHTHMIAS       -LATE PRESENTING MYOCARDIAL INFARCTION       -COPD   CLINICAL FOLLOW-UP RECOMMENDED.   Heparin level (unfractionated)     Status: Abnormal   Collection Time: 10/21/2015  2:51 AM  Result Value Ref Range   Heparin Unfractionated <0.10 (L) 0.30 - 0.70 IU/mL    Comment:        IF HEPARIN RESULTS ARE BELOW EXPECTED VALUES, AND PATIENT DOSAGE HAS BEEN CONFIRMED, SUGGEST FOLLOW UP TESTING OF ANTITHROMBIN III LEVELS.   Renal function panel     Status: Abnormal   Collection Time: 10/14/2015  2:52 AM  Result  Value Ref Range   Sodium 131 (L) 135 - 145 mmol/L   Potassium 3.4 (L) 3.5 - 5.1 mmol/L  Chloride 104 101 - 111 mmol/L   CO2 17 (L) 22 - 32 mmol/L   Glucose, Bld 245 (H) 65 - 99 mg/dL   BUN 45 (H) 6 - 20 mg/dL   Creatinine, Ser 2.80 (H) 0.61 - 1.24 mg/dL   Calcium 7.3 (L) 8.9 - 10.3 mg/dL   Phosphorus 4.6 2.5 - 4.6 mg/dL   Albumin <1.0 (L) 3.5 - 5.0 g/dL   GFR calc non Af Amer 27 (L) >60 mL/min   GFR calc Af Amer 31 (L) >60 mL/min    Comment: (NOTE) The eGFR has been calculated using the CKD EPI equation. This calculation has not been validated in all clinical situations. eGFR's persistently <60 mL/min signify possible Chronic Kidney Disease.    Anion gap 10 5 - 15  CBC     Status: Abnormal   Collection Time: 11/04/2015  2:52 AM  Result Value Ref Range   WBC 42.6 (H) 4.0 - 10.5 K/uL   RBC 3.56 (L) 4.22 - 5.81 MIL/uL   Hemoglobin 9.2 (L) 13.0 - 17.0 g/dL   HCT 27.5 (L) 39.0 - 52.0 %   MCV 77.2 (L) 78.0 - 100.0 fL   MCH 25.8 (L) 26.0 - 34.0 pg   MCHC 33.5 30.0 - 36.0 g/dL   RDW 14.1 11.5 - 15.5 %   Platelets 450 (H) 150 - 400 K/uL  Glucose, capillary     Status: Abnormal   Collection Time: 11/04/2015  7:43 AM  Result Value Ref Range   Glucose-Capillary 187 (H) 65 - 99 mg/dL  Troponin I (q 6hr x 3)     Status: Abnormal   Collection Time: 10/24/2015  8:00 AM  Result Value Ref Range   Troponin I 0.39 (H) <0.031 ng/mL    Comment:        PERSISTENTLY INCREASED TROPONIN VALUES IN THE RANGE OF 0.04-0.49 ng/mL CAN BE SEEN IN:       -UNSTABLE ANGINA       -CONGESTIVE HEART FAILURE       -MYOCARDITIS       -CHEST TRAUMA       -ARRYHTHMIAS       -LATE PRESENTING MYOCARDIAL INFARCTION       -COPD   CLINICAL FOLLOW-UP RECOMMENDED.   Glucose, capillary     Status: Abnormal   Collection Time: 10/25/2015  2:42 PM  Result Value Ref Range   Glucose-Capillary 140 (H) 65 - 99 mg/dL  Troponin I (q 6hr x 3)     Status: Abnormal   Collection Time: 10/21/2015  2:45 PM  Result Value Ref Range     Troponin I 0.25 (H) <0.031 ng/mL    Comment:        PERSISTENTLY INCREASED TROPONIN VALUES IN THE RANGE OF 0.04-0.49 ng/mL CAN BE SEEN IN:       -UNSTABLE ANGINA       -CONGESTIVE HEART FAILURE       -MYOCARDITIS       -CHEST TRAUMA       -ARRYHTHMIAS       -LATE PRESENTING MYOCARDIAL INFARCTION       -COPD   CLINICAL FOLLOW-UP RECOMMENDED.   Heparin level (unfractionated)     Status: Abnormal   Collection Time: 10/17/2015  2:45 PM  Result Value Ref Range   Heparin Unfractionated 0.15 (L) 0.30 - 0.70 IU/mL    Comment:        IF HEPARIN RESULTS ARE BELOW EXPECTED VALUES, AND PATIENT DOSAGE HAS BEEN CONFIRMED, SUGGEST FOLLOW UP  TESTING OF ANTITHROMBIN III LEVELS.   Glucose, capillary     Status: Abnormal   Collection Time: 11/05/2015  5:12 PM  Result Value Ref Range   Glucose-Capillary 119 (H) 65 - 99 mg/dL  Troponin I (q 6hr x 3)     Status: Abnormal   Collection Time: 10/09/2015  8:35 PM  Result Value Ref Range   Troponin I 0.15 (H) <0.031 ng/mL    Comment:        PERSISTENTLY INCREASED TROPONIN VALUES IN THE RANGE OF 0.04-0.49 ng/mL CAN BE SEEN IN:       -UNSTABLE ANGINA       -CONGESTIVE HEART FAILURE       -MYOCARDITIS       -CHEST TRAUMA       -ARRYHTHMIAS       -LATE PRESENTING MYOCARDIAL INFARCTION       -COPD   CLINICAL FOLLOW-UP RECOMMENDED.   Glucose, capillary     Status: Abnormal   Collection Time: 10/24/2015  9:11 PM  Result Value Ref Range   Glucose-Capillary 219 (H) 65 - 99 mg/dL  Heparin level (unfractionated)     Status: Abnormal   Collection Time: 10/09/2015  9:32 PM  Result Value Ref Range   Heparin Unfractionated 0.24 (L) 0.30 - 0.70 IU/mL    Comment:        IF HEPARIN RESULTS ARE BELOW EXPECTED VALUES, AND PATIENT DOSAGE HAS BEEN CONFIRMED, SUGGEST FOLLOW UP TESTING OF ANTITHROMBIN III LEVELS.   Glucose, capillary     Status: Abnormal   Collection Time: 10/13/2015  9:47 PM  Result Value Ref Range   Glucose-Capillary 195 (H) 65 - 99 mg/dL   Renal function panel     Status: Abnormal   Collection Time: 11/03/15  4:45 AM  Result Value Ref Range   Sodium 133 (L) 135 - 145 mmol/L   Potassium 3.5 3.5 - 5.1 mmol/L   Chloride 106 101 - 111 mmol/L   CO2 16 (L) 22 - 32 mmol/L   Glucose, Bld 212 (H) 65 - 99 mg/dL   BUN 41 (H) 6 - 20 mg/dL   Creatinine, Ser 2.06 (H) 0.61 - 1.24 mg/dL   Calcium 7.4 (L) 8.9 - 10.3 mg/dL   Phosphorus 3.7 2.5 - 4.6 mg/dL   Albumin 1.0 (L) 3.5 - 5.0 g/dL   GFR calc non Af Amer 39 (L) >60 mL/min   GFR calc Af Amer 45 (L) >60 mL/min    Comment: (NOTE) The eGFR has been calculated using the CKD EPI equation. This calculation has not been validated in all clinical situations. eGFR's persistently <60 mL/min signify possible Chronic Kidney Disease.    Anion gap 11 5 - 15  Heparin level (unfractionated)     Status: None   Collection Time: 11/03/15  4:45 AM  Result Value Ref Range   Heparin Unfractionated 0.46 0.30 - 0.70 IU/mL    Comment:        IF HEPARIN RESULTS ARE BELOW EXPECTED VALUES, AND PATIENT DOSAGE HAS BEEN CONFIRMED, SUGGEST FOLLOW UP TESTING OF ANTITHROMBIN III LEVELS.   CBC     Status: Abnormal   Collection Time: 11/03/15  4:45 AM  Result Value Ref Range   WBC 40.2 (H) 4.0 - 10.5 K/uL   RBC 3.06 (L) 4.22 - 5.81 MIL/uL   Hemoglobin 8.0 (L) 13.0 - 17.0 g/dL   HCT 23.9 (L) 39.0 - 52.0 %   MCV 78.1 78.0 - 100.0 fL   MCH 26.1 26.0 - 34.0  pg   MCHC 33.5 30.0 - 36.0 g/dL   RDW 14.5 11.5 - 15.5 %   Platelets 484 (H) 150 - 400 K/uL    Imaging: Mr Thoracic Spine Wo Contrast  11/01/2015  CLINICAL DATA:  Sepsis and end-stage renal disease patient. Weakness. The patient is unable to move his left upper extremity. No known injury. Initial encounter. EXAM: MRI THORACIC SPINE WITHOUT CONTRAST TECHNIQUE: Multiplanar, multisequence MR imaging of the thoracic spine was performed. No intravenous contrast was administered. COMPARISON:  CT abdomen and pelvis 10/29/2015 reviewed. FINDINGS: Vertebral  body height and alignment are maintained. No worrisome marrow lesion is identified. Marrow signal is somewhat decreased on T1 weighted imaging likely related to marrow stimulation in association with dialysis. There is no evidence of discitis or osteomyelitis. No epidural abscess is identified. The thoracic cord demonstrates normal signal throughout. Disc height and hydration are maintained at all levels. Paraspinous structures demonstrate bilateral pleural effusions which appear small to moderate. IMPRESSION: Normal-appearing thoracic spine. Negative for evidence of infection. Small moderate bilateral pleural effusions. Electronically Signed   By: Inge Rise M.D.   On: 11/01/2015 13:56    Assessment:  Active Problems:   Hyperglycemia due to type 2 diabetes mellitus (HCC)   Hyponatremia   Essential hypertension   Diabetic neuropathy, type II diabetes mellitus (HCC)   Peri-rectal abscess   CKD (chronic kidney disease)   Leukocytosis   AKI (acute kidney injury) ([place])   Pressure ulcer   Abscess, gluteal, left   Precordial pain   NSTEMI (non-ST elevated myocardial infarction) ([place])   Pericardial effusion   Stroke [facility])   Perirectal abscess   Plan:  Chest pain has resolved - troponin is trending down. D/c heparin - no chest pain, no evidence for LV thrombus. No endocarditis. He is on full dose aspirin and lipitor. Add carvedilol 6.25 mg BID. Would recommend outpatient stress testing based on continued recovery. Renal function improving, but still precludes an invasive work-up.  Cardiology will sign-off. Follow-up in the office in 1-2 weeks.  Time Spent Directly with Patient:  15 minutes  Length of Stay:  LOS: 8 days   Pixie Casino, MD, [facility] Attending Cardiologist [place] 11/03/2015, 8:56 AM

## 2015-11-03 NOTE — Plan of Care (Signed)
Problem: Education: Goal: Understanding of discharge needs will improve Outcome: Progressing Discussed plan of care, medications and why he was on IV antibiotics with the patient and family.

## 2015-11-03 NOTE — Progress Notes (Signed)
PROGRESS NOTE    Shane Dougherty  WUJ:[id number] DOB: [birth year] DOA: 10/13/2015 PCP: Jaclyn Shaggy, MD   Outpatient Specialists: Jaclyn Shaggy, MD  Brief Narrative:  Shane Dougherty is a 39 y.o. male with medical history significant of diabetes. He complains for diarrhea x 1 month. Nothing made better or worse. No history of crohn's/UC- no family hx either. He then a few days before 4/16 developed left buttock pain. He was seen in the ER where he was diagnosed with intergluteal infection with small abscess. An I/D was done that some purulent drainage. He was given norco, clindamycin and told to do warm soaks.  He presented back at [facility] [place] with worsening pain and pressure. tx to [facility] for further I&D in operating room.  Had urinary retention and foley placed.  Creatine and Urine output did not respond to fluid boluses.   Assessment & Plan:   Active Problems:   Hyperglycemia due to type 2 diabetes mellitus (HCC)   Hyponatremia   Essential hypertension   Diabetic neuropathy, type II diabetes mellitus (HCC)   Peri-rectal abscess   CKD (chronic kidney disease)   Leukocytosis   AKI (acute kidney injury) (HCC)   Pressure ulcer   Abscess, gluteal, left   Precordial pain   NSTEMI (non-ST elevated myocardial infarction) (HCC)   Pericardial effusion   Stroke (HCC)   Perirectal abscess   Peri-rectal abscess - s/p I&D - blood cultures- NGTD - culture from I&D: gram + cocci in pairs- group B strep - HIV negative - pain control - Continue IV Abx- Unasyn  ? Crohn's disease GI consult and recommended primary treatment of his perirectal abscess. With improvement in renal function they recommend more advanced imaging to evaluate small bowel such a CT enterography or MR enterography. We will plan on having GI evaluated patient as an outpatient.  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 Creatinine currently improving daily. -U/A ordered  Severe protein calorie malnutrition -albumin 1  Weakness -states he is unable to move LE (walked with cane before admission.Marland Kitchen) -sensation intact -B12 Thiamine level- IV thiamine -MRI of lumbar spine results reviewed - Neurology on board and has signed off - obtain PT evaluation and recommendations.  Elevated troponin - consulted cardiology - TEE negative and cardiology has signed off after their final recommendations. Please refer to their note for details. Will need further work up once condition improves   DVT prophylaxis:  Lovenox  Code Status: Full Code   Family Communication: none discussed with patient directly   Disposition Plan:  tx to SDU   Consultants:   CCS  nephrology  Procedures:   I&D   Subjective: No new complaints reported to me by patient. He states he feels better today  Objective: Filed Vitals:   11/03/15 0455 11/03/15 0802 11/03/15 1200 11/03/15 1606  BP: 145/80 161/78 120/64 102/70  Pulse: 88 98 88 80  Temp: 98 F (36.7 C) 97.9 F (36.6  C) 99.4 F (37.4 C)   TempSrc: Oral Oral Oral   Resp: 16 21 16 13   Height:      Weight:      SpO2: 99% 97% 97% 100%    Intake/Output Summary (Last 24 hours) at 11/03/15 1639 Last data filed at 11/03/15 1315  Gross per 24 hour  Intake 1167.3 ml  Output   1275 ml  Net -107.7 ml   Filed Weights   11/04/2015 1154 11/03/2015 1800  Weight: 65.318 kg (144 lb) 67.132 kg (148 lb)    Examination:  General  exam: Pt in nad, alert and awake Respiratory system: Clear to auscultation anterior- Respiratory effort normal. Cardiovascular system: S1 & S2 heard, RRR. No JVD, murmurs, rubs, gallops or clicks. +edema in hands/feet Gastrointestinal system: Abdomen is nondistended, soft and nontender. No organomegaly or masses felt. Normal bowel sounds heard. Psychiatry: mood and affect appropriate    Data Reviewed: I have personally reviewed following labs and imaging studies  CBC:  Recent Labs Lab 10/30/15 0913 10/31/15 0349 11/01/15 0538 10/12/2015 0252 11/03/15 0445  WBC 49.9* 53.3* 50.6* 42.6* 40.2*  NEUTROABS  --   --  47.6*  --   --   HGB 8.3* 8.5* 8.6* 9.2* 8.0*  HCT 25.9* 24.9* 25.2* 27.5* 23.9*  MCV 79.0 78.5 77.5* 77.2* 78.1  PLT 438* 445* 482* 450* 484*   Basic Metabolic Panel:  Recent Labs Lab 10/30/15 0603 10/31/15 0349 11/01/15 0538 11/01/15 1331 10/19/2015 0252 11/03/15 0445  NA 131* 132* 130*  --  131* 133*  K 3.5 3.3* 3.0*  --  3.4* 3.5  CL 101 105 102  --  104 106  CO2 13* 14* 15*  --  17* 16*  GLUCOSE 26* 101* 203*  --  245* 212*  BUN 44* 45* 47*  --  45* 41*  CREATININE 4.57* 4.17* 3.63*  --  2.80* 2.06*  CALCIUM 7.4* 7.1* 7.1*  --  7.3* 7.4*  MG  --   --   --  1.9  --   --   PHOS 6.6* 6.6* 6.0*  --  4.6 3.7   GFR: Estimated Creatinine Clearance: 44.6 mL/min (by C-G formula based on Cr of 2.06). Liver Function Tests:  Recent Labs Lab 10/29/15 0311  10/30/15 0603 10/31/15 0349 11/01/15 0538 10/19/2015 0252 11/03/15 0445  AST 30  --   --   --   --   --   --   ALT 8*  --   --   --   --   --   --   ALKPHOS 521*  --   --   --   --   --   --   BILITOT 1.0  --   --   --   --   --   --   PROT 5.2*  --   --   --   --   --   --   ALBUMIN 1.0*  < > 1.0* <1.0* <1.0* <1.0* 1.0*  < > = values in this interval not displayed. No results for input(s): LIPASE, AMYLASE in the last 168 hours. No results for input(s): AMMONIA in the last 168 hours. Coagulation  Profile: No results for input(s): INR, PROTIME in the last 168 hours. Cardiac Enzymes:  Recent Labs Lab 11/01/15 1944 10/15/2015 0251 10/31/2015 0800 10/30/2015 1445 10/26/2015 2035  TROPONINI 0.13* 0.11* 0.39* 0.25* 0.15*   BNP (last 3 results) No results for input(s): PROBNP in the last 8760 hours. HbA1C:  No results for input(s): HGBA1C in the last 72 hours. CBG:  Recent Labs Lab Nov 15, 2015 1712 11/15/2015 2111 2015-11-15 2147 11/03/15 0804 11/03/15 1252  GLUCAP 119* 219* 195* 183* 212*   Lipid Profile:  Recent Labs  11/01/15 1331  CHOL 140  HDL <10*  LDLCALC NOT CALCULATED  TRIG 217*  CHOLHDL NOT CALCULATED   Thyroid Function Tests: No results for input(s): TSH, T4TOTAL, FREET4, T3FREE, THYROIDAB in the last 72 hours. Anemia Panel: No results for input(s): VITAMINB12, FOLATE, FERRITIN, TIBC, IRON, RETICCTPCT in the last 72 hours. Urine analysis:    Component Value Date/Time   COLORURINE YELLOW 10/29/2015 1020   APPEARANCEUR TURBID* 10/29/2015 1020   LABSPEC 1.026 10/29/2015 1020   PHURINE 5.0 10/29/2015 1020   GLUCOSEU NEGATIVE 10/29/2015 1020   HGBUR MODERATE* 10/29/2015 1020   BILIRUBINUR NEGATIVE 10/29/2015 1020   KETONESUR NEGATIVE 10/29/2015 1020   PROTEINUR 100* 10/29/2015 1020   UROBILINOGEN 1.0 08/20/2014 1042   NITRITE NEGATIVE 10/29/2015 1020   LEUKOCYTESUR MODERATE* 10/29/2015 1020   Recent Results (from the past 240 hour(s))  Culture, blood (Routine X 2) w Reflex to ID Panel     Status: None   Collection Time: 10/12/2015  7:52 PM  Result Value Ref Range Status   Specimen Description BLOOD LEFT ANTECUBITAL  Final   Special Requests BOTTLES DRAWN AEROBIC ONLY 10CC  Final   Culture NO GROWTH 5 DAYS  Final   Report Status 10/31/2015 FINAL  Final  Culture, blood (Routine X 2) w Reflex to ID Panel     Status: None   Collection Time: 10/10/2015  8:00 PM  Result Value Ref Range Status   Specimen Description BLOOD LEFT FOREARM  Final   Special Requests  BOTTLES DRAWN AEROBIC ONLY 9CC  Final   Culture NO GROWTH 5 DAYS  Final   Report Status 10/31/2015 FINAL  Final  Surgical pcr screen     Status: None   Collection Time: 10/31/2015  8:27 AM  Result Value Ref Range Status   MRSA, PCR NEGATIVE NEGATIVE Final   Staphylococcus aureus NEGATIVE NEGATIVE Final    Comment:        The Xpert SA Assay (FDA approved for NASAL specimens in patients over 49 years of age), is one component of a comprehensive surveillance program.  Test performance has been validated by [facility] for patients greater than or equal to 53 year old. It is not intended to diagnose infection nor to guide or monitor treatment.   Anaerobic culture     Status: None   Collection Time: 10/13/2015 11:27 AM  Result Value Ref Range Status   Specimen Description ABSCESS BUTTOCKS LEFT  Final   Special Requests NONE  Final   Gram Stain   Final    MODERATE WBC PRESENT,BOTH PMN AND MONONUCLEAR NO SQUAMOUS EPITHELIAL CELLS SEEN FEW GRAM POSITIVE COCCI IN PAIRS Performed at [facility]    Culture   Final    NO ANAEROBES ISOLATED Performed at [facility]    Report Status 11/01/2015 FINAL  Final  Culture, routine-abscess     Status: None   Collection Time: 10/21/2015 11:27 AM  Result Value Ref Range Status   Specimen Description ABSCESS BUTTOCKS LEFT  Final   Special Requests NONE  Final   Gram Stain   Final    MODERATE WBC PRESENT,BOTH PMN AND MONONUCLEAR NO SQUAMOUS EPITHELIAL CELLS SEEN FEW GRAM POSITIVE COCCI IN PAIRS Performed at [facility]  Final    MODERATE GROUP B STREP(S.AGALACTIAE)ISOLATED Note: Beta hemolytic streptococci are predictably susceptible to penicillin and other beta lactams. Susceptibility testing not routinely performed. Performed at [facility]    Report Status 10/30/2015 FINAL  Final  C difficile quick scan w PCR reflex     Status: None   Collection Time: 10/29/15  4:34 PM  Result Value Ref  Range Status   C Diff antigen NEGATIVE NEGATIVE Final   C Diff toxin NEGATIVE NEGATIVE Final   C Diff interpretation Negative for toxigenic C. difficile  Final  Culture, Urine     Status: None   Collection Time: 10/29/15  7:36 PM  Result Value Ref Range Status   Specimen Description URINE, RANDOM  Final   Special Requests NONE  Final   Culture NO GROWTH 1 DAY  Final   Report Status 10/30/2015 FINAL  Final  MRSA PCR Screening     Status: None   Collection Time: 10/30/15  7:24 PM  Result Value Ref Range Status   MRSA by PCR NEGATIVE NEGATIVE Final    Comment:        The GeneXpert MRSA Assay (FDA approved for NASAL specimens only), is one component of a comprehensive MRSA colonization surveillance program. It is not intended to diagnose MRSA infection nor to guide or monitor treatment for MRSA infections.   Culture, blood (routine x 2)     Status: None (Preliminary result)   Collection Time: 11/01/15 12:20 AM  Result Value Ref Range Status   Specimen Description BLOOD RIGHT ARM  Final   Special Requests IN PEDIATRIC BOTTLE  Final   Culture NO GROWTH 2 DAYS  Final   Report Status PENDING  Incomplete  Culture, blood (routine x 2)     Status: None (Preliminary result)   Collection Time: 11/01/15 12:46 AM  Result Value Ref Range Status   Specimen Description BLOOD LEFT HAND  Final   Special Requests BOTTLES DRAWN AEROBIC AND ANAEROBIC  Final   Culture NO GROWTH 2 DAYS  Final   Report Status PENDING  Incomplete      Anti-infectives    Start     Dose/Rate Route Frequency Ordered Stop   11/03/15 1200  Ampicillin-Sulbactam (UNASYN) 3 g in sodium chloride 0.9 % 100 mL IVPB     3 g 100 mL/hr over 60 Minutes Intravenous Every 8 hours 11/03/15 1104     10/31/15 1300  ampicillin-sulbactam (UNASYN) 1.5 g in sodium chloride 0.9 % 50 mL IVPB  Status:  Discontinued     1.5 g 100 mL/hr over 30 Minutes Intravenous Every 12 hours 10/31/15 1214 11/03/15 1104   10/29/15 2000   metroNIDAZOLE (FLAGYL) IVPB 500 mg  Status:  Discontinued     500 mg 100 mL/hr over 60 Minutes Intravenous Every 8 hours 10/29/15 1852 10/30/15 1312   10/29/15 0800  vancomycin (VANCOCIN) IVPB 750 mg/150 ml premix  Status:  Discontinued     750 mg 150 mL/hr over 60 Minutes Intravenous Every 24 hours 10/28/15 1058 10/31/15 0750   10/18/2015 0800  vancomycin (VANCOCIN) 500 mg in sodium chloride 0.9 % 100 mL IVPB  Status:  Discontinued     500 mg 100 mL/hr over 60 Minutes Intravenous Every 12 hours 10/19/2015 1931 10/28/15 1058   10/24/2015 0200  piperacillin-tazobactam (ZOSYN) IVPB 3.375 g  Status:  Discontinued     3.375 g 12.5 mL/hr over 240 Minutes Intravenous Every 8 hours 10/24/2015 1938 10/31/15 1205   10/30/2015 2000  piperacillin-tazobactam (ZOSYN) IVPB 3.375 g     3.375 g 100 mL/hr over 30 Minutes Intravenous  Once 10/08/2015 1903 10/13/2015 2117   11/05/2015 2000  vancomycin (VANCOCIN) IVPB 1000 mg/200 mL premix     1,000 mg 200 mL/hr over 60 Minutes Intravenous  Once 10/28/2015 1903 11/01/2015 2147       Radiology Studies: No results found.      Scheduled Meds: . ampicillin-sulbactam (UNASYN) IV  3 g Intravenous Q8H  . aspirin  325 mg Oral QHS  . atorvastatin  80 mg Oral q1800  . carvedilol  6.25 mg Oral BID WC  . cholecalciferol  2,000 Units Oral Daily  . darbepoetin (ARANESP) injection - NON-DIALYSIS  60 mcg Subcutaneous Q14 Days  . docusate sodium  100 mg Oral BID  . furosemide  20 mg Oral Daily  . heparin subcutaneous  5,000 Units Subcutaneous Q8H  . insulin aspart  0-9 Units Subcutaneous TID WC  . insulin glargine  15 Units Subcutaneous QHS  . thiamine  100 mg Oral Daily   Continuous Infusions:     LOS: 8 days    Time spent: 25 min    Penny Pia, DO [facility] Pager [phone]  If 7PM-7AM, please contact night-coverage [email or web address] Password [facility] 11/03/2015, 4:39 PM

## 2015-11-03 NOTE — Consult Note (Signed)
Subjective:  In some more discomfort today. No specific complaints at this time.  Objective Vital signs in last 24 hours: Filed Vitals:   10/19/2015 1800 11/03/15 0017 11/03/15 0455 11/03/15 0802  BP: 123/54 153/82 145/80 161/78  Pulse: 95 93 88 98  Temp:  97.9 F (36.6 C) 98 F (36.7 C) 97.9 F (36.6 C)  TempSrc:  Oral Oral Oral  Resp: Height:      Weight:      SpO2: 98% 98% 99% 97%   Weight change:   Intake/Output Summary (Last 24 hours) at 11/03/15 1209 Last data filed at 11/03/15 0800  Gross per 24 hour  Intake 1140.3 ml  Output    825 ml  Net  315.3 ml    Assessment/ Plan: Pt is a 40 y.o. yo male who was admitted on 10/08/2015 with a left buttock abscess, noted to have AoCKD possibly 2/2 ATN from hypoperfusion from sepsis (abscess).  Assessment/Plan: 1. Acute on Chronic Kidney Disease; in setting of Sepsis; possible ischemic ATN: (CKD likely 2/2 poorly controlled DM) UOP greatly improved at 1.2L in 24hrs. Cr improved to 2.06 today, w/ Baseline around 1.6. Getting PTH 76. Phos 3.7. Renal function recovering w/o needed hemodialysis. Lower extremity edema will likely not improve until Albumin improves (currently <1.0). Patient acidotic w/ Bicarb of 16. 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 Will start Lasix  daily for this. 2. BP/Volume: UOP 1.2L; up 12.5L since admission. BPs good now. 3. Anemia: Hgb 8.0 but stable. Will monitor. WBC improving, now at 40  Will also start Aranesp for anemia of chronic disease and will continue to monitor monthly Hgb as well as iron studies every 2 months. 4. Abscess: per surgery 5. Diarrhea: resolved. C Diff PCR neg.  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  Lasix as above and will follow labs.  He may require bicarb tabs, however low AG c/w type IV RTA and treatment is loop diuretics.  6. Diabetes: poorly controlled. Likely cause of CKD. Management per primary team. 7. Embolic strokes:  No clear  source w/ imaging. TEE w/o evidence of SBE. Neuro believes 2/2 procoagulative state from infection.   Renal function improved significantly. Will sign off at this time as patient's renal function has been consistently stable. Will have patient f/u with nephrologist in 2 months.   Labs: Basic Metabolic Panel:  Recent Labs Lab 11/01/15 0538 10/31/2015 0252 11/03/15 0445  NA 130* 131* 133*  K 3.0* 3.4* 3.5  CL 102 104 106  CO2 15* 17* 16*  GLUCOSE 203* 245* 212*  BUN 47* 45* 41*  CREATININE 3.63* 2.80* 2.06*  CALCIUM 7.1* 7.3* 7.4*  PHOS 6.0* 4.6 3.7   Liver Function Tests:  Recent Labs Lab 10/29/15 0311  11/01/15 0538 11/05/2015 0252 11/03/15 0445  AST 30  --   --   --   --   ALT 8*  --   --   --   --   ALKPHOS 521*  --   --   --   --   BILITOT 1.0  --   --   --   --   PROT 5.2*  --   --   --   --   ALBUMIN 1.0*  < > <1.0* <1.0* 1.0*  < > = values in this interval not displayed. No results for input(s): LIPASE, AMYLASE in the last 168 hours. No results for input(s): AMMONIA in the  last 168 hours. CBC:  Recent Labs Lab 10/30/15 0913 10/31/15 0349 11/01/15 0538 10/18/2015 0252 11/03/15 0445  WBC 49.9* 53.3* 50.6* 42.6* 40.2*  NEUTROABS  --   --  47.6*  --   --   HGB 8.3* 8.5* 8.6* 9.2* 8.0*  HCT 25.9* 24.9* 25.2* 27.5* 23.9*  MCV 79.0 78.5 77.5* 77.2* 78.1  PLT 438* 445* 482* 450* 484*   Cardiac Enzymes:  Recent Labs Lab 11/01/15 1944 11/01/2015 0251 10/11/2015 0800 10/16/2015 1445 10/23/2015 2035  TROPONINI 0.13* 0.11* 0.39* 0.25* 0.15*   CBG:  Recent Labs Lab 10/10/2015 0743 11/01/2015 1442 10/09/2015 1712 10/21/2015 2111 11/05/2015 2147  GLUCAP 187* 140* 119* 219* 195*    Iron Studies: No results for input(s): IRON, TIBC, TRANSFERRIN, FERRITIN in the last 72 hours. Studies/Results: Mr Thoracic Spine Wo Contrast  11/01/2015  CLINICAL DATA:  Sepsis and end-stage renal disease patient. Weakness. The patient is unable to move his left upper extremity. No known  injury. Initial encounter. EXAM: MRI THORACIC SPINE WITHOUT CONTRAST TECHNIQUE: Multiplanar, multisequence MR imaging of the thoracic spine was performed. No intravenous contrast was administered. COMPARISON:  CT abdomen and pelvis 10/29/2015 reviewed. FINDINGS: Vertebral body height and alignment are maintained. No worrisome marrow lesion is identified. Marrow signal is somewhat decreased on T1 weighted imaging likely related to marrow stimulation in association with dialysis. There is no evidence of discitis or osteomyelitis. No epidural abscess is identified. The thoracic cord demonstrates normal signal throughout. Disc height and hydration are maintained at all levels. Paraspinous structures demonstrate bilateral pleural effusions which appear small to moderate. IMPRESSION: Normal-appearing thoracic spine. Negative for evidence of infection. Small moderate bilateral pleural effusions. Electronically Signed   By: Drusilla Kanner M.D.   On: 11/01/2015 13:56   Medications: Infusions:    Scheduled Medications: . ampicillin-sulbactam (UNASYN) IV  3 g Intravenous Q8H  . aspirin  325 mg Oral QHS  . atorvastatin  80 mg Oral q1800  . carvedilol  6.25 mg Oral BID WC  . cholecalciferol  2,000 Units Oral Daily  . docusate sodium  100 mg Oral BID  . furosemide  20 mg Oral Daily  . insulin aspart  0-9 Units Subcutaneous TID WC  . insulin glargine  15 Units Subcutaneous QHS  . thiamine  100 mg Oral Daily    have reviewed scheduled and prn medications.  Physical Exam: General -- NAD, laying in bed Chest -- good expansion. Lungs clear to auscultation. No increased WOB Cardiac -- RRR. No murmurs noted.  Abdomen -- soft, nontender. No masses palpable. Bowel sounds present.  Extremeties - +1 pitting edema in LE bilaterally. Dorsalis pedis pulses present and symmetrical.    Kathee Delton, MD,MS,  PGY2 11/03/2015 12:09 PM    I have seen and examined this patient and agree with plan as outlined by Dr.  Wende Mott above with some additions in red.  Will sign off and will set up for outpatient follow up, however he will need to continue with close f/u with his PCP after discharge and weekly renal panels until he returns to baseline.  Will also start Aranesp for anemia of chronic disease. Jantz A Franny Selvage,MD 11/03/2015 1:16 PM

## 2015-11-03 NOTE — Progress Notes (Signed)
ANTICOAGULATION CONSULT NOTE - Follow Up Consult  Pharmacy Consult for Heparin  Indication: chest pain/ACS  Allergies  Allergen Reactions  . Bee Venom Swelling    Throat involvement    Patient Measurements: Height:  (170.2 cm) Weight: 148 lb (67.132 kg) IBW/kg (Calculated) : 66.1  Vital Signs: Temp: 97.9 F (36.6 C) (04/28 0017) Temp Source: Oral (04/28 0017) BP: 153/82 mmHg (04/28 0017) Pulse Rate: 95 (04/27 1800)  Labs:  Recent Labs  11/01/15 0538  10/29/2015 0252 10/14/2015 0800 10/16/2015 1445 10/17/2015 2035 11/01/2015 2132 11/03/15 0445  HGB 8.6*  --  9.2*  --   --   --   --  8.0*  HCT 25.2*  --  27.5*  --   --   --   --  23.9*  PLT 482*  --  450*  --   --   --   --  484*  HEPARINUNFRC  --   < >  --   --  0.15*  --  0.24* 0.46  CREATININE 3.63*  --  2.80*  --   --   --   --   --   TROPONINI 0.32*  < >  --  0.39* 0.25* 0.15*  --   --   < > = values in this interval not displayed.  Estimated Creatinine Clearance: 32.8 mL/min (by C-G formula based on Cr of 2.8).   Assessment: On heparin for elevated troponin, heparin level therapeutic x 1 after rate increase  Goal of Therapy:  Heparin level 0.3-0.5 units/ml, due to stroke Monitor platelets by anticoagulation protocol: Yes   Plan:  -No bolus with recent stroke -Cont heparin at 1550 units/hr -1200 HL -Daily CBC/HL -Monitor for bleeding  Shane Dougherty 11/03/2015,5:41 AM

## 2015-11-03 NOTE — Progress Notes (Signed)
VC .50L with poor effort.

## 2015-11-03 NOTE — Progress Notes (Signed)
1 Day Post-Op  Subjective: Pt weak but in good spirits    Objective: Vital signs in last 24 hours: Temp:  [97.9 F (36.6 C)-98.6 F (37 C)] 98 F (36.7 C) (04/28 0455) Pulse Rate:  [79-99] 95 (04/27 1800) Resp:  [10-39] 16 (04/27 1800) BP: (118-153)/(54-85) 145/80 mmHg (04/28 0455) SpO2:  [95 %-100 %] 98 % (04/27 1800) Last BM Date: 11/01/15  Intake/Output from previous day: 04/27 0701 - 04/28 0700 In: 1124 [P.O.:840; I.V.:284] Out: 1275 [Urine:1275] Intake/Output this shift:    GI: soft, non-tender; bowel sounds normal; no masses,  no organomegaly Incision/Wound:wound macerated and stool covered  4 cm of ulceration adjacent to penrose site no fluctuance or SQ air   Lab Results:   Recent Labs  10/25/2015 0252 11/03/15 0445  WBC 42.6* 40.2*  HGB 9.2* 8.0*  HCT 27.5* 23.9*  PLT 450* 484*   BMET  Recent Labs  11/01/2015 0252 11/03/15 0445  NA 131* 133*  K 3.4* 3.5  CL 104 106  CO2 17* 16*  GLUCOSE 245* 212*  BUN 45* 41*  CREATININE 2.80* 2.06*  CALCIUM 7.3* 7.4*   PT/INR No results for input(s): LABPROT, INR in the last 72 hours. ABG No results for input(s): PHART, HCO3 in the last 72 hours.  Invalid input(s): PCO2, PO2  Studies/Results: Mr Thoracic Spine Wo Contrast  11/01/2015  CLINICAL DATA:  Sepsis and end-stage renal disease patient. Weakness. The patient is unable to move his left upper extremity. No known injury. Initial encounter. EXAM: MRI THORACIC SPINE WITHOUT CONTRAST TECHNIQUE: Multiplanar, multisequence MR imaging of the thoracic spine was performed. No intravenous contrast was administered. COMPARISON:  CT abdomen and pelvis 10/29/2015 reviewed. FINDINGS: Vertebral body height and alignment are maintained. No worrisome marrow lesion is identified. Marrow signal is somewhat decreased on T1 weighted imaging likely related to marrow stimulation in association with dialysis. There is no evidence of discitis or osteomyelitis. No epidural abscess is  identified. The thoracic cord demonstrates normal signal throughout. Disc height and hydration are maintained at all levels. Paraspinous structures demonstrate bilateral pleural effusions which appear small to moderate. IMPRESSION: Normal-appearing thoracic spine. Negative for evidence of infection. Small moderate bilateral pleural effusions. Electronically Signed   By: Drusilla Kanner M.D.   On: 11/01/2015 13:56    Anti-infectives: Anti-infectives    Start     Dose/Rate Route Frequency Ordered Stop   10/31/15 1300  ampicillin-sulbactam (UNASYN) 1.5 g in sodium chloride 0.9 % 50 mL IVPB     1.5 g 100 mL/hr over 30 Minutes Intravenous Every 12 hours 10/31/15 1214     10/29/15 2000  metroNIDAZOLE (FLAGYL) IVPB 500 mg  Status:  Discontinued     500 mg 100 mL/hr over 60 Minutes Intravenous Every 8 hours 10/29/15 1852 10/30/15 1312   10/29/15 0800  vancomycin (VANCOCIN) IVPB 750 mg/150 ml premix  Status:  Discontinued     750 mg 150 mL/hr over 60 Minutes Intravenous Every 24 hours 10/28/15 1058 10/31/15 0750   10/15/2015 0800  vancomycin (VANCOCIN) 500 mg in sodium chloride 0.9 % 100 mL IVPB  Status:  Discontinued     500 mg 100 mL/hr over 60 Minutes Intravenous Every 12 hours 10/19/2015 1931 10/28/15 1058   10/16/2015 0200  piperacillin-tazobactam (ZOSYN) IVPB 3.375 g  Status:  Discontinued     3.375 g 12.5 mL/hr over 240 Minutes Intravenous Every 8 hours 11/01/2015 1938 10/31/15 1205   10/17/2015 2000  piperacillin-tazobactam (ZOSYN) IVPB 3.375 g     3.375  g 100 mL/hr over 30 Minutes Intravenous  Once 11/05/2015 1903 10/08/2015 2117   10/09/2015 2000  vancomycin (VANCOCIN) IVPB 1000 mg/200 mL premix     1,000 mg 200 mL/hr over 60 Minutes Intravenous  Once 10/17/2015 1903 10/14/2015 2147      Assessment/Plan: Patient Active Problem List   Diagnosis Date Noted  . Stroke (HCC)   . Perirectal abscess   . Abscess, gluteal, left   . Precordial pain   . NSTEMI (non-ST elevated myocardial infarction) (HCC)    . Pericardial effusion   . Pressure ulcer 10/31/2015  . AKI (acute kidney injury) (HCC) 10/29/2015  . Peri-rectal abscess 10/11/2015  . CKD (chronic kidney disease) 10/08/2015  . Leukocytosis 10/15/2015  . Loss of weight 05/11/2015  . Pain in joint, lower leg 04/26/2015  . Diabetic neuropathy, type II diabetes mellitus (HCC) 04/26/2015  . Erectile dysfunction 04/12/2015  . Essential hypertension 04/06/2015  . Facial cellulitis 04/03/2015  . Hyperglycemia due to type 2 diabetes mellitus (HCC)   . Hyponatremia   . Absolute anemia    Pod 8   I/D perirectal abscess with penrose WBC slowly trending down  No  areas of pus and no signs of necrosis to explain WBC  Abdomen soft and non tender   Area is macerated  Recommend air mattress  CVA/spinal cord CVA     Moving right leg on heparin/ neurology has seen    Leave penrose in place area is stable    Adjacent area of skin loss 4 cm  In diameter is stable   Need to roll patient off buttocks    Instructed nursing staff  LOS: 8 days    Grier Vu A. 11/03/2015

## 2015-11-04 ENCOUNTER — Encounter (HOSPITAL_COMMUNITY): Payer: Self-pay | Admitting: Cardiology

## 2015-11-04 LAB — CBC
HCT: 23.6 % — ABNORMAL LOW (ref 39.0–52.0)
Hemoglobin: 7.5 g/dL — ABNORMAL LOW (ref 13.0–17.0)
MCH: 25.5 pg — ABNORMAL LOW (ref 26.0–34.0)
MCHC: 31.8 g/dL (ref 30.0–36.0)
MCV: 80.3 fL (ref 78.0–100.0)
PLATELETS: 464 10*3/uL — AB (ref 150–400)
RBC: 2.94 MIL/uL — AB (ref 4.22–5.81)
RDW: 15.1 % (ref 11.5–15.5)
WBC: 34.7 10*3/uL — AB (ref 4.0–10.5)

## 2015-11-04 LAB — RENAL FUNCTION PANEL
Albumin: 1.1 g/dL — ABNORMAL LOW (ref 3.5–5.0)
Anion gap: 12 (ref 5–15)
BUN: 37 mg/dL — ABNORMAL HIGH (ref 6–20)
CALCIUM: 7.6 mg/dL — AB (ref 8.9–10.3)
CO2: 15 mmol/L — ABNORMAL LOW (ref 22–32)
CREATININE: 1.93 mg/dL — AB (ref 0.61–1.24)
Chloride: 108 mmol/L (ref 101–111)
GFR, EST AFRICAN AMERICAN: 48 mL/min — AB (ref >60)
GFR, EST NON AFRICAN AMERICAN: 42 mL/min — AB (ref >60)
Glucose, Bld: 179 mg/dL — ABNORMAL HIGH (ref 65–99)
PHOSPHORUS: 3.8 mg/dL (ref 2.5–4.6)
Potassium: 4.3 mmol/L (ref 3.5–5.1)
SODIUM: 135 mmol/L (ref 135–145)

## 2015-11-04 LAB — GLUCOSE, CAPILLARY
GLUCOSE-CAPILLARY: 157 mg/dL — AB (ref 65–99)
GLUCOSE-CAPILLARY: 267 mg/dL — AB (ref 65–99)
GLUCOSE-CAPILLARY: 230 mg/dL — AB (ref 65–99)
Glucose-Capillary: 202 mg/dL — ABNORMAL HIGH (ref 65–99)

## 2015-11-04 LAB — FERRITIN: Ferritin: 1140 ng/mL — ABNORMAL HIGH (ref 24–336)

## 2015-11-04 LAB — IRON AND TIBC: IRON: 35 ug/dL — AB (ref 45–182)

## 2015-11-04 NOTE — Progress Notes (Signed)
NURSING PROGRESS NOTE  Shane Dougherty [id number] Transfer Data: 11/04/2015 7:51 PM Attending Provider: Penny Pia, MD BJY:[id number], Venetia Night, MD Code Status: Full   Shane Dougherty is a 40 y.o. male patient transferred from [facility]  -No acute distress noted.  -No complaints of shortness of breath.  -No complaints of chest pain.   Last Documented Vital Signs: Blood pressure 145/75, pulse 87, temperature 99.3 F (37.4 C), temperature source Oral, resp. rate 18, height  (1.702 m), weight 67.132 kg (148 lb), SpO2 100 %.  IV Fluids:  IV in place, occlusive dsg intact without redness, IV cath forearm right, condition patent and no redness and left, condition patent and no redness IV push only, no IV fluids.   Allergies:  Bee venom  Past Medical History:   has a past medical history of Diabetes mellitus without complication (HCC); Bell's palsy (08/2014); and CKD (chronic kidney disease).  Past Surgical History:   has past surgical history that includes Incise and drain abcess (10/23/2015); Incision and drainage abscess (Left, 10/29/2015); and TEE without cardioversion (N/A, 09-Nov-2015).  Social History:   reports that he has been passively smoking.  He has never used smokeless tobacco. He reports that he drinks alcohol. He reports that he does not use illicit drugs.  Skin: intact except where otherwise charted; groin/perirectal area abscess and excoriated.  Patient/Family orientated to room. Information packet given to patient/family. Admission inpatient armband information verified with patient/family to include name and date of birth and placed on patient arm. Side rails up x 2, fall assessment and education completed with patient/family. Patient/family able to verbalize understanding of risk associated with falls and verbalized understanding to call for assistance before getting out of bed. Call light within reach. Patient/family able to voice and demonstrate understanding of unit orientation  instructions.

## 2015-11-04 NOTE — Progress Notes (Signed)
PROGRESS NOTE    Shane Dougherty  KGM:[id number] DOB: 06-04-1976 DOA: 10/25/2015 PCP: Jaclyn Shaggy, MD   Outpatient Specialists: Jaclyn Shaggy, MD  Brief Narrative:  VEASNA Dougherty is a 40 y.o. male with medical history significant of diabetes. He complains for diarrhea x 1 month. Nothing made better or worse. No history of crohn's/UC- no family hx either. He then a few days before 4/16 developed left buttock pain. He was seen in the ER where he was diagnosed with intergluteal infection with small abscess. An I/D was done that some purulent drainage. He was given norco, clindamycin and told to do warm soaks.  He presented back at [facility] [place] with worsening pain and pressure. tx to [facility] for further I&D in operating room.  Had urinary retention and foley placed.  Creatine and Urine output did not respond to fluid boluses.   Assessment & Plan:   Active Problems:   Hyperglycemia due to type 2 diabetes mellitus (HCC)   Hyponatremia   Essential hypertension   Diabetic neuropathy, type II diabetes mellitus (HCC)   Peri-rectal abscess   CKD (chronic kidney disease)   Leukocytosis   AKI (acute kidney injury) (HCC)   Pressure ulcer   Abscess, gluteal, left   Precordial pain   NSTEMI (non-ST elevated myocardial infarction) (HCC)   Pericardial effusion   Stroke (HCC)   Perirectal abscess   Peri-rectal abscess - s/p I&D - blood cultures- NGTD - culture from I&D: gram + cocci in pairs- group B strep - HIV negative - pain control - Continue IV Abx- Unasyn  ? Crohn's disease GI consult and recommended primary treatment of his perirectal abscess. With improvement in renal function they recommend more advanced imaging to evaluate small bowel such a CT enterography or MR enterography. We will plan on having GI evaluated patient as an outpatient.  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 Creatinine currently improving daily. -U/A ordered  Severe protein calorie malnutrition -albumin 1  Weakness -states he is unable to move LE (walked with cane before admission.Marland Kitchen) -sensation intact -B12 Thiamine level- IV thiamine - Neurology on board while patient in-house and currently suspecting anterior spinal artery ASA infarct area, GBS or transverse myelitis unlikely per their report - Patient will need ongoing physical therapy and most likely transition to SNF  Elevated troponin - consulted cardiology - TEE negative and cardiology has signed off after their final recommendations. Please refer to their note for details. Will need further work up once condition improves   DVT prophylaxis:  Lovenox  Code Status: Full Code   Family Communication: none discussed with patient directly   Disposition Plan:  tx to SDU   Consultants:   CCS  nephrology  Procedures:   I&D   Subjective: Patient reports feeling better today  Objective: Filed Vitals:   11/04/15 0554 11/04/15 0600 11/04/15 0800 11/04/15 1200  BP:  121/68 125/71 132/77  Pulse: 85 84 82 83  Temp:   99.4 F (37.4 C) 99.2 F (37.3 C)  TempSrc:   Oral Oral  Resp: Height:      Weight:      SpO2: 99% 99% 99% 98%    Intake/Output Summary (Last 24 hours) at 11/04/15 1432 Last data filed at 11/04/15 1255  Gross per 24 hour  Intake    680 ml  Output   1150 ml  Net   -470 ml   Filed Weights   10/11/2015 1154 11/01/2015 1800  Weight: 65.318 kg (144  lb) 67.132 kg (148 lb)    Examination:  General exam: Pt in nad, alert and awake Respiratory system: Clear to auscultation anterior- Respiratory effort normal. Cardiovascular system: S1 & S2 heard, RRR. No JVD, murmurs, rubs, gallops or clicks. +edema in hands/feet Gastrointestinal system: Abdomen is nondistended, soft and nontender. No organomegaly or masses felt. Normal bowel sounds heard. Psychiatry: mood and affect appropriate    Data Reviewed: I have personally reviewed following labs and imaging studies  CBC:  Recent Labs Lab 10/31/15 0349 11/01/15 0538 10/15/2015 0252 11/03/15 0445 11/04/15 0424  WBC 53.3* 50.6* 42.6* 40.2* 34.7*  NEUTROABS  --  47.6*  --   --   --   HGB 8.5* 8.6* 9.2* 8.0* 7.5*  HCT 24.9* 25.2* 27.5* 23.9* 23.6*  MCV 78.5 77.5* 77.2* 78.1 80.3  PLT 445* 482* 450* 484* 464*   Basic Metabolic Panel:  Recent Labs Lab 10/31/15 0349 11/01/15 0538 11/01/15 1331 10/08/2015 0252 11/03/15 0445 11/04/15 0424  NA 132* 130*  --  131* 133* 135  K 3.3* 3.0*  --  3.4* 3.5 4.3  CL 105 102  --  104 106 108  CO2 14* 15*  --  17* 16* 15*  GLUCOSE 101* 203*  --  245* 212* 179*  BUN 45* 47*  --  45* 41* 37*  CREATININE 4.17* 3.63*  --  2.80* 2.06* 1.93*  CALCIUM 7.1* 7.1*  --  7.3* 7.4* 7.6*  MG  --   --  1.9  --   --   --   PHOS 6.6* 6.0*  --  4.6 3.7 3.8   GFR: Estimated Creatinine Clearance: 47.6 mL/min (by C-G formula based on Cr of 1.93). Liver Function Tests:  Recent Labs Lab 10/29/15 0311  10/31/15 0349 11/01/15 0538 10/09/2015 0252 11/03/15 0445 11/04/15 0424  AST 30  --   --   --   --   --   --   ALT 8*  --   --   --   --   --   --   ALKPHOS 521*  --   --   --   --   --   --   BILITOT 1.0  --   --   --   --   --   --   PROT 5.2*  --   --   --   --   --   --   ALBUMIN 1.0*  < > <1.0* <1.0* <1.0* 1.0* 1.1*  < > = values in this interval not displayed. No results for input(s): LIPASE, AMYLASE in the last 168 hours. No results for input(s):  AMMONIA in the last 168 hours. Coagulation Profile: No results for input(s): INR, PROTIME in the last 168 hours. Cardiac Enzymes:  Recent Labs Lab 11/01/15 1944 11/04/2015 0251 10/23/2015 0800 10/18/2015 1445 11/01/2015 2035  TROPONINI 0.13* 0.11* 0.39* 0.25* 0.15*   BNP (last 3 results) No  results for input(s): PROBNP in the last 8760 hours. HbA1C: No results for input(s): HGBA1C in the last 72 hours. CBG:  Recent Labs Lab 11/03/15 0804 11/03/15 1252 11/03/15 1641 11/03/15 2113 11/04/15 0817  GLUCAP 183* 212* 182* 205* 157*   Lipid Profile: No results for input(s): CHOL, HDL, LDLCALC, TRIG, CHOLHDL, LDLDIRECT in the last 72 hours. Thyroid Function Tests: No results for input(s): TSH, T4TOTAL, FREET4, T3FREE, THYROIDAB in the last 72 hours. Anemia Panel:  Recent Labs  11/04/15 0424  FERRITIN 1140*  TIBC NOT CALCULATED  IRON 35*   Urine analysis:    Component Value Date/Time   COLORURINE YELLOW 10/29/2015 1020   APPEARANCEUR TURBID* 10/29/2015 1020   LABSPEC 1.026 10/29/2015 1020   PHURINE 5.0 10/29/2015 1020   GLUCOSEU NEGATIVE 10/29/2015 1020   HGBUR MODERATE* 10/29/2015 1020   BILIRUBINUR NEGATIVE 10/29/2015 1020   KETONESUR NEGATIVE 10/29/2015 1020   PROTEINUR 100* 10/29/2015 1020   UROBILINOGEN 1.0 08/20/2014 1042   NITRITE NEGATIVE 10/29/2015 1020   LEUKOCYTESUR MODERATE* 10/29/2015 1020   Recent Results (from the past 240 hour(s))  Culture, blood (Routine X 2) w Reflex to ID Panel     Status: None   Collection Time: Nov 01, 2015  7:52 PM  Result Value Ref Range Status   Specimen Description BLOOD LEFT ANTECUBITAL  Final   Special Requests BOTTLES DRAWN AEROBIC ONLY 10CC  Final   Culture NO GROWTH 5 DAYS  Final   Report Status 10/31/2015 FINAL  Final  Culture, blood (Routine X 2) w Reflex to ID Panel     Status: None   Collection Time: 11/01/2015  8:00 PM  Result Value Ref Range Status   Specimen Description BLOOD LEFT FOREARM  Final   Special Requests  BOTTLES DRAWN AEROBIC ONLY 9CC  Final   Culture NO GROWTH 5 DAYS  Final   Report Status 10/31/2015 FINAL  Final  Surgical pcr screen     Status: None   Collection Time: 11/05/2015  8:27 AM  Result Value Ref Range Status   MRSA, PCR NEGATIVE NEGATIVE Final   Staphylococcus aureus NEGATIVE NEGATIVE Final    Comment:        The Xpert SA Assay (FDA approved for NASAL specimens in patients over 10 years of age), is one component of a comprehensive surveillance program.  Test performance has been validated by [facility] for patients greater than or equal to 10 year old. It is not intended to diagnose infection nor to guide or monitor treatment.   Anaerobic culture     Status: None   Collection Time: 10/10/2015 11:27 AM  Result Value Ref Range Status   Specimen Description ABSCESS BUTTOCKS LEFT  Final   Special Requests NONE  Final   Gram Stain   Final    MODERATE WBC PRESENT,BOTH PMN AND MONONUCLEAR NO SQUAMOUS EPITHELIAL CELLS SEEN FEW GRAM POSITIVE COCCI IN PAIRS Performed at [facility]    Culture   Final    NO ANAEROBES ISOLATED Performed at [facility]    Report Status 11/01/2015 FINAL  Final  Culture, routine-abscess     Status: None   Collection Time: 10/17/2015 11:27 AM  Result Value Ref Range Status   Specimen Description ABSCESS BUTTOCKS LEFT  Final   Special Requests NONE  Final   Gram Stain   Final    MODERATE WBC PRESENT,BOTH PMN AND MONONUCLEAR NO SQUAMOUS EPITHELIAL CELLS SEEN FEW GRAM POSITIVE COCCI IN PAIRS Performed at [facility]  Culture   Final    MODERATE GROUP B STREP(S.AGALACTIAE)ISOLATED Note: Beta hemolytic streptococci are predictably susceptible to penicillin and other beta lactams. Susceptibility testing not routinely performed. Performed at [facility]    Report Status 10/30/2015 FINAL  Final  C difficile quick scan w PCR reflex     Status: None   Collection Time: 10/29/15  4:34 PM  Result Value Ref  Range Status   C Diff antigen NEGATIVE NEGATIVE Final   C Diff toxin NEGATIVE NEGATIVE Final   C Diff interpretation Negative for toxigenic C. difficile  Final  Culture, Urine     Status: None   Collection Time: 10/29/15  7:36 PM  Result Value Ref Range Status   Specimen Description URINE, RANDOM  Final   Special Requests NONE  Final   Culture NO GROWTH 1 DAY  Final   Report Status 10/30/2015 FINAL  Final  MRSA PCR Screening     Status: None   Collection Time: 10/30/15  7:24 PM  Result Value Ref Range Status   MRSA by PCR NEGATIVE NEGATIVE Final    Comment:        The GeneXpert MRSA Assay (FDA approved for NASAL specimens only), is one component of a comprehensive MRSA colonization surveillance program. It is not intended to diagnose MRSA infection nor to guide or monitor treatment for MRSA infections.   Culture, blood (routine x 2)     Status: None (Preliminary result)   Collection Time: 11/01/15 12:20 AM  Result Value Ref Range Status   Specimen Description BLOOD RIGHT ARM  Final   Special Requests IN PEDIATRIC BOTTLE  Final   Culture NO GROWTH 3 DAYS  Final   Report Status PENDING  Incomplete  Culture, blood (routine x 2)     Status: None (Preliminary result)   Collection Time: 11/01/15 12:46 AM  Result Value Ref Range Status   Specimen Description BLOOD LEFT HAND  Final   Special Requests BOTTLES DRAWN AEROBIC AND ANAEROBIC  Final   Culture NO GROWTH 3 DAYS  Final   Report Status PENDING  Incomplete      Anti-infectives    Start     Dose/Rate Route Frequency Ordered Stop   11/03/15 1200  Ampicillin-Sulbactam (UNASYN) 3 g in sodium chloride 0.9 % 100 mL IVPB     3 g 100 mL/hr over 60 Minutes Intravenous Every 8 hours 11/03/15 1104     10/31/15 1300  ampicillin-sulbactam (UNASYN) 1.5 g in sodium chloride 0.9 % 50 mL IVPB  Status:  Discontinued     1.5 g 100 mL/hr over 30 Minutes Intravenous Every 12 hours 10/31/15 1214 11/03/15 1104   10/29/15 2000   metroNIDAZOLE (FLAGYL) IVPB 500 mg  Status:  Discontinued     500 mg 100 mL/hr over 60 Minutes Intravenous Every 8 hours 10/29/15 1852 10/30/15 1312   10/29/15 0800  vancomycin (VANCOCIN) IVPB 750 mg/150 ml premix  Status:  Discontinued     750 mg 150 mL/hr over 60 Minutes Intravenous Every 24 hours 10/28/15 1058 10/31/15 0750   11/05/2015 0800  vancomycin (VANCOCIN) 500 mg in sodium chloride 0.9 % 100 mL IVPB  Status:  Discontinued     500 mg 100 mL/hr over 60 Minutes Intravenous Every 12 hours 10-29-15 1931 10/28/15 1058   10/07/2015 0200  piperacillin-tazobactam (ZOSYN) IVPB 3.375 g  Status:  Discontinued     3.375 g 12.5 mL/hr over 240 Minutes Intravenous Every 8 hours 10/29/15 1938 10/31/15 1205  10/31/2015 2000  piperacillin-tazobactam (ZOSYN) IVPB 3.375 g     3.375 g 100 mL/hr over 30 Minutes Intravenous  Once 10/31/2015 1903 10/07/2015 2117   10/15/2015 2000  vancomycin (VANCOCIN) IVPB 1000 mg/200 mL premix     1,000 mg 200 mL/hr over 60 Minutes Intravenous  Once 10/31/2015 1903 10/11/2015 2147       Radiology Studies: No results found.      Scheduled Meds: . ampicillin-sulbactam (UNASYN) IV  3 g Intravenous Q8H  . aspirin  325 mg Oral QHS  . atorvastatin  80 mg Oral q1800  . carvedilol  6.25 mg Oral BID WC  . cholecalciferol  2,000 Units Oral Daily  . darbepoetin (ARANESP) injection - NON-DIALYSIS  60 mcg Subcutaneous Q14 Days  . docusate sodium  100 mg Oral BID  . furosemide  20 mg Oral Daily  . heparin subcutaneous  5,000 Units Subcutaneous Q8H  . insulin aspart  0-9 Units Subcutaneous TID WC  . insulin glargine  15 Units Subcutaneous QHS  . thiamine  100 mg Oral Daily   Continuous Infusions:     LOS: 9 days    Time spent: 25 min    Penny Pia, DO [facility] Pager [phone]  If 7PM-7AM, please contact night-coverage [email or web address] Password [facility] 11/04/2015, 2:32 PM

## 2015-11-04 NOTE — Progress Notes (Signed)
2 Days Post-Op  Subjective: Complains of general body ache   Objective: Vital signs in last 24 hours: Temp:  [98.6 F (37 C)-99.5 F (37.5 C)] 99.4 F (37.4 C) (04/29 0333) Pulse Rate:  [77-95] 84 (04/29 0600) Resp:  [10-22] 15 (04/29 0600) BP: (102-138)/(64-81) 121/68 mmHg (04/29 0600) SpO2:  [97 %-100 %] 99 % (04/29 0600) Last BM Date: 11/03/15  Intake/Output from previous day: 04/28 0701 - 04/29 0700 In: 961.3 [P.O.:480; I.V.:181.3; IV Piggyback:300] Out: 1200 [Urine:1200] Intake/Output this shift:    Wound about the same with maceration but no purulence  Lab Results:   Recent Labs  11/03/15 0445 11/04/15 0424  WBC 40.2* 34.7*  HGB 8.0* 7.5*  HCT 23.9* 23.6*  PLT 484* 464*   BMET  Recent Labs  11/03/15 0445 11/04/15 0424  NA 133* 135  K 3.5 4.3  CL 106 108  CO2 16* 15*  GLUCOSE 212* 179*  BUN 41* 37*  CREATININE 2.06* 1.93*  CALCIUM 7.4* 7.6*   PT/INR No results for input(s): LABPROT, INR in the last 72 hours. ABG No results for input(s): PHART, HCO3 in the last 72 hours.  Invalid input(s): PCO2, PO2  Studies/Results: No results found.  Anti-infectives: Anti-infectives    Start     Dose/Rate Route Frequency Ordered Stop   11/03/15 1200  Ampicillin-Sulbactam (UNASYN) 3 g in sodium chloride 0.9 % 100 mL IVPB     3 g 100 mL/hr over 60 Minutes Intravenous Every 8 hours 11/03/15 1104     10/31/15 1300  ampicillin-sulbactam (UNASYN) 1.5 g in sodium chloride 0.9 % 50 mL IVPB  Status:  Discontinued     1.5 g 100 mL/hr over 30 Minutes Intravenous Every 12 hours 10/31/15 1214 11/03/15 1104   10/29/15 2000  metroNIDAZOLE (FLAGYL) IVPB 500 mg  Status:  Discontinued     500 mg 100 mL/hr over 60 Minutes Intravenous Every 8 hours 10/29/15 1852 10/30/15 1312   10/29/15 0800  vancomycin (VANCOCIN) IVPB 750 mg/150 ml premix  Status:  Discontinued     750 mg 150 mL/hr over 60 Minutes Intravenous Every 24 hours 10/28/15 1058 10/31/15 0750   2015/11/17 0800   vancomycin (VANCOCIN) 500 mg in sodium chloride 0.9 % 100 mL IVPB  Status:  Discontinued     500 mg 100 mL/hr over 60 Minutes Intravenous Every 12 hours 10/29/2015 1931 10/28/15 1058   11/17/2015 0200  piperacillin-tazobactam (ZOSYN) IVPB 3.375 g  Status:  Discontinued     3.375 g 12.5 mL/hr over 240 Minutes Intravenous Every 8 hours 10/18/2015 1938 10/31/15 1205   10/29/2015 2000  piperacillin-tazobactam (ZOSYN) IVPB 3.375 g     3.375 g 100 mL/hr over 30 Minutes Intravenous  Once 10/09/2015 1903 11/05/2015 2117   11/03/2015 2000  vancomycin (VANCOCIN) IVPB 1000 mg/200 mL premix     1,000 mg 200 mL/hr over 60 Minutes Intravenous  Once 10/14/2015 1903 10/11/2015 2147      Assessment/Plan: s/p Procedure(s): TRANSESOPHAGEAL ECHOCARDIOGRAM (TEE) (N/A)  Continue wound care Try to rotate off as much as possible Will see again Monday   LOS: 9 days    Joshiah Traynham A 11/04/2015

## 2015-11-04 NOTE — Progress Notes (Signed)
Pt asleep unable to perform  NIF/VC at this time

## 2015-11-04 NOTE — Progress Notes (Signed)
11/04/2015 patient temp at 1730 was 100.9 orally. Temp was retaken at 1822 it was 99.1 axillary and  Dr Cena Benton was made aware.  He stated it was ok to transfer patient to [facility]. Lovie Macadamia RN.

## 2015-11-05 LAB — RENAL FUNCTION PANEL
ANION GAP: 10 (ref 5–15)
Albumin: 1 g/dL — ABNORMAL LOW (ref 3.5–5.0)
BUN: 34 mg/dL — ABNORMAL HIGH (ref 6–20)
CHLORIDE: 105 mmol/L (ref 101–111)
CO2: 17 mmol/L — ABNORMAL LOW (ref 22–32)
Calcium: 7.6 mg/dL — ABNORMAL LOW (ref 8.9–10.3)
Creatinine, Ser: 1.84 mg/dL — ABNORMAL HIGH (ref 0.61–1.24)
GFR, EST AFRICAN AMERICAN: 51 mL/min — AB (ref >60)
GFR, EST NON AFRICAN AMERICAN: 44 mL/min — AB (ref >60)
Glucose, Bld: 226 mg/dL — ABNORMAL HIGH (ref 65–99)
POTASSIUM: 3.7 mmol/L (ref 3.5–5.1)
Phosphorus: 4.1 mg/dL (ref 2.5–4.6)
Sodium: 132 mmol/L — ABNORMAL LOW (ref 135–145)

## 2015-11-05 LAB — GLUCOSE, CAPILLARY
GLUCOSE-CAPILLARY: 211 mg/dL — AB (ref 65–99)
GLUCOSE-CAPILLARY: 248 mg/dL — AB (ref 65–99)
GLUCOSE-CAPILLARY: 204 mg/dL — AB (ref 65–99)
Glucose-Capillary: 207 mg/dL — ABNORMAL HIGH (ref 65–99)

## 2015-11-05 NOTE — Progress Notes (Signed)
PROGRESS NOTE    Shane Dougherty  BMW:[id number] DOB: September 18, 1975 DOA: 2015/11/23 PCP: Jaclyn Shaggy, MD   Outpatient Specialists: Jaclyn Shaggy, MD  Brief Narrative:  Shane Dougherty is a 40 y.o. male with medical history significant of diabetes. He complains for diarrhea x 1 month. Nothing made better or worse. No history of crohn's/UC- no family hx either. He then a few days before 4/16 developed left buttock pain. He was seen in the ER where he was diagnosed with intergluteal infection with small abscess. An I/D was done that some purulent drainage. He was given norco, clindamycin and told to do warm soaks.  He presented back at [facility] [place] with worsening pain and pressure. tx to [facility] for further I&D in operating room.  Had urinary retention and foley placed.  Creatine and Urine output did not respond to fluid boluses.   Assessment & Plan:   Active Problems:   Hyperglycemia due to type 2 diabetes mellitus (HCC)   Hyponatremia   Essential hypertension   Diabetic neuropathy, type II diabetes mellitus (HCC)   Peri-rectal abscess   CKD (chronic kidney disease)   Leukocytosis   AKI (acute kidney injury) (HCC)   Pressure ulcer   Abscess, gluteal, left   Precordial pain   NSTEMI (non-ST elevated myocardial infarction) (HCC)   Pericardial effusion   Stroke (HCC)   Perirectal abscess   Peri-rectal abscess - s/p I&D - blood cultures- NGTD - culture from I&D: gram + cocci in pairs- group B strep - HIV negative - pain control - Continue IV Abx- Unasyn  ? Crohn's disease GI consult and recommended primary treatment of his perirectal abscess. With improvement in renal function they recommend more advanced imaging to evaluate small bowel such a CT enterography or MR enterography. We will plan on having GI evaluated patient as an outpatient.  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 Creatinine currently improving daily. -U/A ordered  Severe protein calorie malnutrition -albumin 1  Weakness -states he is unable to move LE (walked with cane before admission.Marland Kitchen) -sensation intact -B12 Thiamine level- IV thiamine - Neurology on board while patient in-house and currently suspecting anterior spinal artery ASA infarct area, GBS or transverse myelitis unlikely per their report - SNF once surgeon clears for discharge.  Elevated troponin - consulted cardiology - TEE negative and cardiology has signed off after their final recommendations. Please refer to their note for details. Will need further work up once condition improves  DVT prophylaxis:  Lovenox  Code Status: Full Code   Family Communication: none discussed with patient directly   Disposition Plan:  tx to SDU   Consultants:   CCS  nephrology  Procedures:   I&D   Subjective: Patient has no new complaints.   Objective: Filed Vitals:   11/04/15 1905 11/04/15 2130 11/05/15 0531 11/05/15 1400  BP: 145/75 134/67 131/69 135/71  Pulse: 87 81 81 80  Temp: 99.3 F (  37.4 C) 97.7 F (36.5 C) 98.3 F (36.8 C) 98.3 F (36.8 C)  TempSrc: Oral Oral Oral Oral  Resp: 18 18 18 20   Height:      Weight:      SpO2: 100% 96% 99% 99%    Intake/Output Summary (Last 24 hours) at 11/05/15 1705 Last data filed at 11/05/15 1500  Gross per 24 hour  Intake    240 ml  Output    800 ml  Net   -560 ml   Filed Weights   10/11/2015 1154 10/23/2015 1800   Weight: 65.318 kg (144 lb) 67.132 kg (148 lb)    Examination:  General exam: Pt in nad, alert and awake Respiratory system: Clear to auscultation anterior- Respiratory effort normal. Cardiovascular system: S1 & S2 heard, RRR. No JVD, murmurs, rubs, gallops or clicks. +edema in hands/feet Gastrointestinal system: Abdomen is nondistended, soft and nontender. No organomegaly or masses felt. Normal bowel sounds heard. Psychiatry: mood and affect appropriate    Data Reviewed: I have personally reviewed following labs and imaging studies  CBC:  Recent Labs Lab 10/31/15 0349 11/01/15 0538 11/04/2015 0252 11/03/15 0445 11/04/15 0424  WBC 53.3* 50.6* 42.6* 40.2* 34.7*  NEUTROABS  --  47.6*  --   --   --   HGB 8.5* 8.6* 9.2* 8.0* 7.5*  HCT 24.9* 25.2* 27.5* 23.9* 23.6*  MCV 78.5 77.5* 77.2* 78.1 80.3  PLT 445* 482* 450* 484* 464*   Basic Metabolic Panel:  Recent Labs Lab 11/01/15 0538 11/01/15 1331 11/01/2015 0252 11/03/15 0445 11/04/15 0424 11/05/15 0728  NA 130*  --  131* 133* 135 132*  K 3.0*  --  3.4* 3.5 4.3 3.7  CL 102  --  104 106 108 105  CO2 15*  --  17* 16* 15* 17*  GLUCOSE 203*  --  245* 212* 179* 226*  BUN 47*  --  45* 41* 37* 34*  CREATININE 3.63*  --  2.80* 2.06* 1.93* 1.84*  CALCIUM 7.1*  --  7.3* 7.4* 7.6* 7.6*  MG  --  1.9  --   --   --   --   PHOS 6.0*  --  4.6 3.7 3.8 4.1   GFR: Estimated Creatinine Clearance: 49.9 mL/min (by C-G formula based on Cr of 1.84). Liver Function Tests:  Recent Labs Lab 11/01/15 0538 10/11/2015 0252 11/03/15 0445 11/04/15 0424 11/05/15 0728  ALBUMIN <1.0* <1.0* 1.0* 1.1* 1.0*   No results for input(s): LIPASE, AMYLASE in the last 168 hours. No results for input(s): AMMONIA in the last 168 hours. Coagulation Profile: No results for input(s): INR, PROTIME in the last 168 hours. Cardiac Enzymes:  Recent Labs Lab 11/01/15 1944 10/30/2015 0251 10/29/2015 0800 10/29/2015 1445 10/23/2015 2035  TROPONINI 0.13* 0.11* 0.39*  0.25* 0.15*   BNP (last 3 results) No results for input(s): PROBNP in the last 8760 hours. HbA1C: No results for input(s): HGBA1C in the last 72 hours. CBG:  Recent Labs Lab 11/04/15 1252 11/04/15 1713 11/04/15 2152 11/05/15 0746 11/05/15 1137  GLUCAP 267* 230* 202* 211* 248*   Lipid Profile: No results for input(s): CHOL, HDL, LDLCALC, TRIG, CHOLHDL, LDLDIRECT in the last 72 hours. Thyroid Function Tests: No results for input(s): TSH, T4TOTAL, FREET4, T3FREE, THYROIDAB in the last 72 hours. Anemia Panel:  Recent Labs  11/04/15 0424  FERRITIN 1140*  TIBC NOT CALCULATED  IRON 35*   Urine analysis:    Component Value Date/Time   COLORURINE YELLOW 10/29/2015 1020   APPEARANCEUR TURBID*  10/29/2015 1020   LABSPEC 1.026 10/29/2015 1020   PHURINE 5.0 10/29/2015 1020   GLUCOSEU NEGATIVE 10/29/2015 1020   HGBUR MODERATE* 10/29/2015 1020   BILIRUBINUR NEGATIVE 10/29/2015 1020   KETONESUR NEGATIVE 10/29/2015 1020   PROTEINUR 100* 10/29/2015 1020   UROBILINOGEN 1.0 08/20/2014 1042   NITRITE NEGATIVE 10/29/2015 1020   LEUKOCYTESUR MODERATE* 10/29/2015 1020   Recent Results (from the past 240 hour(s))  Culture, blood (Routine X 2) w Reflex to ID Panel     Status: None   Collection Time: 10/25/2015  7:52 PM  Result Value Ref Range Status   Specimen Description BLOOD LEFT ANTECUBITAL  Final   Special Requests BOTTLES DRAWN AEROBIC ONLY 10CC  Final   Culture NO GROWTH 5 DAYS  Final   Report Status 10/31/2015 FINAL  Final  Culture, blood (Routine X 2) w Reflex to ID Panel     Status: None   Collection Time: 10/27/2015  8:00 PM  Result Value Ref Range Status   Specimen Description BLOOD LEFT FOREARM  Final   Special Requests BOTTLES DRAWN AEROBIC ONLY 9CC  Final   Culture NO GROWTH 5 DAYS  Final   Report Status 10/31/2015 FINAL  Final  Surgical pcr screen     Status: None   Collection Time: Nov 17, 2015  8:27 AM  Result Value Ref Range Status   MRSA, PCR NEGATIVE NEGATIVE Final    Staphylococcus aureus NEGATIVE NEGATIVE Final    Comment:        The Xpert SA Assay (FDA approved for NASAL specimens in patients over 80 years of age), is one component of a comprehensive surveillance program.  Test performance has been validated by [facility] for patients greater than or equal to 23 year old. It is not intended to diagnose infection nor to guide or monitor treatment.   Anaerobic culture     Status: None   Collection Time: 17-Nov-2015 11:27 AM  Result Value Ref Range Status   Specimen Description ABSCESS BUTTOCKS LEFT  Final   Special Requests NONE  Final   Gram Stain   Final    MODERATE WBC PRESENT,BOTH PMN AND MONONUCLEAR NO SQUAMOUS EPITHELIAL CELLS SEEN FEW GRAM POSITIVE COCCI IN PAIRS Performed at [facility]    Culture   Final    NO ANAEROBES ISOLATED Performed at [facility]    Report Status 11/01/2015 FINAL  Final  Culture, routine-abscess     Status: None   Collection Time: 11-17-15 11:27 AM  Result Value Ref Range Status   Specimen Description ABSCESS BUTTOCKS LEFT  Final   Special Requests NONE  Final   Gram Stain   Final    MODERATE WBC PRESENT,BOTH PMN AND MONONUCLEAR NO SQUAMOUS EPITHELIAL CELLS SEEN FEW GRAM POSITIVE COCCI IN PAIRS Performed at [facility]    Culture   Final    MODERATE GROUP B STREP(S.AGALACTIAE)ISOLATED Note: Beta hemolytic streptococci are predictably susceptible to penicillin and other beta lactams. Susceptibility testing not routinely performed. Performed at [facility]    Report Status 10/30/2015 FINAL  Final  C difficile quick scan w PCR reflex     Status: None   Collection Time: 10/29/15  4:34 PM  Result Value Ref Range Status   C Diff antigen NEGATIVE NEGATIVE Final   C Diff toxin NEGATIVE NEGATIVE Final   C Diff interpretation Negative for toxigenic C. difficile  Final  Culture, Urine     Status: None   Collection Time: 10/29/15  7:36 PM  Result Value Ref Range  Status   Specimen Description URINE, RANDOM  Final   Special Requests NONE  Final   Culture NO GROWTH 1 DAY  Final   Report Status 10/30/2015 FINAL  Final  MRSA PCR Screening     Status: None   Collection Time: 10/30/15  7:24 PM  Result Value Ref Range Status   MRSA by PCR NEGATIVE NEGATIVE Final    Comment:        The GeneXpert MRSA Assay (FDA approved for NASAL specimens only), is one component of a comprehensive MRSA colonization surveillance program. It is not intended to diagnose MRSA infection nor to guide or monitor treatment for MRSA infections.   Culture, blood (routine x 2)     Status: None (Preliminary result)   Collection Time: 11/01/15 12:20 AM  Result Value Ref Range Status   Specimen Description BLOOD RIGHT ARM  Final   Special Requests IN PEDIATRIC BOTTLE  Final   Culture NO GROWTH 4 DAYS  Final   Report Status PENDING  Incomplete  Culture, blood (routine x 2)     Status: None (Preliminary result)   Collection Time: 11/01/15 12:46 AM  Result Value Ref Range Status   Specimen Description BLOOD LEFT HAND  Final   Special Requests BOTTLES DRAWN AEROBIC AND ANAEROBIC  Final   Culture NO GROWTH 4 DAYS  Final   Report Status PENDING  Incomplete      Anti-infectives    Start     Dose/Rate Route Frequency Ordered Stop   11/03/15 1200  Ampicillin-Sulbactam (UNASYN) 3 g in sodium chloride 0.9 % 100 mL IVPB     3 g 100 mL/hr over 60 Minutes Intravenous Every 8 hours 11/03/15 1104     10/31/15 1300  ampicillin-sulbactam (UNASYN) 1.5 g in sodium chloride 0.9 % 50 mL IVPB  Status:  Discontinued     1.5 g 100 mL/hr over 30 Minutes Intravenous Every 12 hours 10/31/15 1214 11/03/15 1104   10/29/15 2000  metroNIDAZOLE (FLAGYL) IVPB 500 mg  Status:  Discontinued     500 mg 100 mL/hr over 60 Minutes Intravenous Every 8 hours 10/29/15 1852 10/30/15 1312   10/29/15 0800  vancomycin (VANCOCIN) IVPB 750 mg/150 ml premix  Status:  Discontinued     750 mg 150 mL/hr  over 60 Minutes Intravenous Every 24 hours 10/28/15 1058 10/31/15 0750   11/03/2015 0800  vancomycin (VANCOCIN) 500 mg in sodium chloride 0.9 % 100 mL IVPB  Status:  Discontinued     500 mg 100 mL/hr over 60 Minutes Intravenous Every 12 hours 10/12/2015 1931 10/28/15 1058   10/22/2015 0200  piperacillin-tazobactam (ZOSYN) IVPB 3.375 g  Status:  Discontinued     3.375 g 12.5 mL/hr over 240 Minutes Intravenous Every 8 hours 10/12/2015 1938 10/31/15 1205   11/01/2015 2000  piperacillin-tazobactam (ZOSYN) IVPB 3.375 g     3.375 g 100 mL/hr over 30 Minutes Intravenous  Once 10/22/2015 1903 10/07/2015 2117   10/25/2015 2000  vancomycin (VANCOCIN) IVPB 1000 mg/200 mL premix     1,000 mg 200 mL/hr over 60 Minutes Intravenous  Once 10/20/2015 1903 10/30/2015 2147       Radiology Studies: No results found.      Scheduled Meds: . ampicillin-sulbactam (UNASYN) IV  3 g Intravenous Q8H  . aspirin  325 mg Oral QHS  . atorvastatin  80 mg Oral q1800  . carvedilol  6.25 mg Oral BID WC  . cholecalciferol  2,000 Units Oral Daily  .  darbepoetin (ARANESP) injection - NON-DIALYSIS  60 mcg Subcutaneous Q14 Days  . docusate sodium  100 mg Oral BID  . furosemide  20 mg Oral Daily  . heparin subcutaneous  5,000 Units Subcutaneous Q8H  . insulin aspart  0-9 Units Subcutaneous TID WC  . insulin glargine  15 Units Subcutaneous QHS  . thiamine  100 mg Oral Daily   Continuous Infusions:     LOS: 10 days    Time spent: 25 min    Penny Pia, DO [facility] Pager [phone]  If 7PM-7AM, please contact night-coverage [email or web address] Password [email or web address] 11/05/2015, 5:05 PM

## 2015-11-05 NOTE — Progress Notes (Signed)
PT unable to perform test

## 2015-11-05 NOTE — Progress Notes (Signed)
Patient unable to perform NIF/VC at this time. 

## 2015-11-06 LAB — CULTURE, BLOOD (ROUTINE X 2)
CULTURE: NO GROWTH
Culture: NO GROWTH

## 2015-11-06 LAB — RENAL FUNCTION PANEL
ANION GAP: 11 (ref 5–15)
Albumin: <1 g/dL — ABNORMAL LOW (ref 3.5–5.0)
BUN: 34 mg/dL — AB (ref 6–20)
CALCIUM: 7.7 mg/dL — AB (ref 8.9–10.3)
CO2: 18 mmol/L — ABNORMAL LOW (ref 22–32)
CREATININE: 1.74 mg/dL — AB (ref 0.61–1.24)
Chloride: 103 mmol/L (ref 101–111)
GFR calc non Af Amer: 47 mL/min — ABNORMAL LOW (ref >60)
GFR, EST AFRICAN AMERICAN: 55 mL/min — AB (ref >60)
Glucose, Bld: 212 mg/dL — ABNORMAL HIGH (ref 65–99)
PHOSPHORUS: 4.7 mg/dL — AB (ref 2.5–4.6)
POTASSIUM: 3.9 mmol/L (ref 3.5–5.1)
SODIUM: 132 mmol/L — AB (ref 135–145)

## 2015-11-06 LAB — GLUCOSE, CAPILLARY
Glucose-Capillary: 203 mg/dL — ABNORMAL HIGH (ref 65–99)
Glucose-Capillary: 204 mg/dL — ABNORMAL HIGH (ref 65–99)
Glucose-Capillary: 230 mg/dL — ABNORMAL HIGH (ref 65–99)

## 2015-11-06 LAB — CBC
HCT: 22.3 % — ABNORMAL LOW (ref 39.0–52.0)
Hemoglobin: 7.4 g/dL — ABNORMAL LOW (ref 13.0–17.0)
MCH: 26.7 pg (ref 26.0–34.0)
MCHC: 33.2 g/dL (ref 30.0–36.0)
MCV: 80.5 fL (ref 78.0–100.0)
PLATELETS: 575 10*3/uL — AB (ref 150–400)
RBC: 2.77 MIL/uL — ABNORMAL LOW (ref 4.22–5.81)
RDW: 15.5 % (ref 11.5–15.5)
WBC: 43.3 10*3/uL — AB (ref 4.0–10.5)

## 2015-11-06 NOTE — Progress Notes (Signed)
Patient did NIF -24 and VC .76 L  Best values with three attempts each.

## 2015-11-06 NOTE — Hospital Discharge Follow-Up (Signed)
The patient's hospital follow up appointment at the [facility] for 11/25/2015 has been cancelled. Will re-scheduled when discharge plan is determined.

## 2015-11-06 NOTE — Progress Notes (Signed)
Inpatient Diabetes Program Recommendations  AACE/ADA: New Consensus Statement on Inpatient Glycemic Control (2015)  Target Ranges:  Prepandial:   less than 140 mg/dL      Peak postprandial:   less than 180 mg/dL (1-2 hours)      Critically ill patients:  140 - 180 mg/dL   Results for Shane Dougherty, Shane Dougherty (MRN [id number]) as of 11/06/2015 10:05  Ref. Range 11/05/2015 07:46 11/05/2015 11:37 11/05/2015 16:56 11/05/2015 23:00  Glucose-Capillary Latest Ref Range: 65-99 mg/dL 409 (H) 811 (H) 914 (H) 204 (H)   Results for Shane Dougherty, Shane Dougherty (MRN [id number]) as of 11/06/2015 10:05  Ref. Range 11/06/2015 08:02  Glucose-Capillary Latest Ref Range: 65-99 mg/dL 086 (H)    Home DM Meds: Lantus 25 units QHS       Humalog 10 units tidwc  Current Insulin Orders: Lantus 15 units QHS      Novolog Sensitive Correction Scale/ SSI (0-9 units) TID AC      MD- Please consider the following in-hospital insulin adjustments to improve glucose control:  1. Increase Lantus to 20 units QHS (home dose of Lantus is 25 units QHS)  2. Increase Novolog Correction Scale/ SSI to Moderate scale (0-15 units) TID AC + HS (currently ordered as Sensitive scale 0-9 units)        --Will follow patient during hospitalization--  Ambrose Finland RN, MSN, CDE Diabetes Coordinator Inpatient Glycemic Control Team Team Pager: [phone] (8a-5p)

## 2015-11-06 NOTE — Progress Notes (Signed)
PROGRESS NOTE    Shane Dougherty  ZOX:[id number] DOB: [birth year] DOA: 10/19/2015 PCP: Jaclyn Shaggy, MD   Outpatient Specialists: Jaclyn Shaggy, MD  Brief Narrative:  Shane Dougherty is a 40 y.o. male with medical history significant of diabetes. He complains for diarrhea x 1 month. Nothing made better or worse. No history of crohn's/UC- no family hx either. He then a few days before 4/16 developed left buttock pain. He was seen in the ER where he was diagnosed with intergluteal infection with small abscess. An I/D was done that some purulent drainage. He was given norco, clindamycin and told to do warm soaks.  He presented back at [facility] [place] with worsening pain and pressure. tx to [facility] for further I&D in operating room.  Had urinary retention and foley placed.  Creatine and Urine output did not respond to fluid boluses.   Assessment & Plan:   Active Problems:   Hyperglycemia due to type 2 diabetes mellitus (HCC)   Hyponatremia   Essential hypertension   Diabetic neuropathy, type II diabetes mellitus (HCC)   Peri-rectal abscess   CKD (chronic kidney disease)   Leukocytosis   AKI (acute kidney injury) (HCC)   Pressure ulcer   Abscess, gluteal, left   Precordial pain   NSTEMI (non-ST elevated myocardial infarction) (HCC)   Pericardial effusion   Stroke (HCC)   Perirectal abscess   Peri-rectal abscess - s/p I&D - blood cultures- NGTD - culture from I&D: gram + cocci in pairs- group B strep - HIV negative - pain control - Continue IV Abx- Unasyn - Gen. surgery considering taking to the OR tomorrow for debridement.  Gen surgery recommending colostomy to prevent reeinfection from BM's  ? Crohn's disease GI consult and recommended primary treatment of his perirectal abscess. With improvement in renal function they recommend more advanced imaging to evaluate small bowel such a CT enterography or MR enterography. We will plan on having GI evaluated patient as an  outpatient.  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 Creatinine currently improving daily. -U/A ordered  Severe protein calorie malnutrition -albumin 1  Weakness -states he is unable to move LE (walked with cane before admission.Marland Kitchen) -sensation intact -B12 Thiamine level- IV thiamine - Neurology on board while patient in-house and currently suspecting anterior spinal artery ASA infarct area, GBS or transverse myelitis unlikely per their report - SNF once surgeon cleared for discharge.  Elevated troponin - consulted cardiology - TEE negative and cardiology has signed off after their final recommendations. Please refer to their note for details. Will need further work up once condition improves  DVT prophylaxis:  Lovenox  Code Status: Full Code   Family Communication: none discussed with patient directly   Disposition Plan:  tx to SDU   Consultants:   CCS  nephrology  Procedures:   I&D   Subjective: Patient has no new complaints.   Objective: Filed Vitals:   11/05/15 2100 11/05/15  2124 11/06/15 0601 11/06/15 1526  BP:  128/71 118/68 130/74  Pulse:  88 80 85  Temp: 99 F (37.2 C)  97.3 F (36.3 C) 98.2 F (36.8 C)  TempSrc: Oral     Resp:  17 18 18   Height:      Weight:      SpO2:  100% 97% 99%    Intake/Output Summary (Last 24 hours) at 11/06/15 1704 Last data filed at 11/06/15 1111  Gross per 24 hour  Intake     480 ml  Output   1590 ml  Net  -1110 ml   Filed Weights   10/28/2015 1154 10/13/2015 1800  Weight: 65.318 kg (144 lb) 67.132 kg (148 lb)    Examination:  General exam: Pt in nad, alert and awake Respiratory system: Clear to auscultation anterior- Respiratory effort normal. Cardiovascular system: S1 & S2 heard, RRR. No JVD, murmurs, rubs, gallops or clicks. +edema in hands/feet Gastrointestinal system: Abdomen is nondistended, soft and nontender. No organomegaly or masses felt. Normal bowel sounds heard. Psychiatry: mood and affect appropriate    Data Reviewed: I have personally reviewed following labs and imaging studies  CBC:  Recent Labs Lab 11/01/15 0538 11-25-15 0252 11/03/15 0445 11/04/15 0424 11/06/15 0631  WBC 50.6* 42.6* 40.2* 34.7* 43.3*  NEUTROABS 47.6*  --   --   --   --   HGB 8.6* 9.2* 8.0* 7.5* 7.4*  HCT 25.2* 27.5* 23.9* 23.6* 22.3*  MCV 77.5* 77.2* 78.1 80.3 80.5  PLT 482* 450* 484* 464* 575*   Basic Metabolic Panel:  Recent Labs Lab 11/01/15 1331 11-25-2015 0252 11/03/15 0445 11/04/15 0424 11/05/15 0728 11/06/15 0631  NA  --  131* 133* 135 132* 132*  K  --  3.4* 3.5 4.3 3.7 3.9  CL  --  104 106 108 105 103  CO2  --  17* 16* 15* 17* 18*  GLUCOSE  --  245* 212* 179* 226* 212*  BUN  --  45* 41* 37* 34* 34*  CREATININE  --  2.80* 2.06* 1.93* 1.84* 1.74*  CALCIUM  --  7.3* 7.4* 7.6* 7.6* 7.7*  MG 1.9  --   --   --   --   --   PHOS  --  4.6 3.7 3.8 4.1 4.7*   GFR: Estimated Creatinine Clearance: 52.8 mL/min (by C-G formula based on Cr of 1.74). Liver Function Tests:  Recent Labs Lab Nov 25, 2015 0252 11/03/15 0445 11/04/15 0424 11/05/15 0728 11/06/15 0631  ALBUMIN <1.0* 1.0* 1.1* 1.0* <1.0*   No results for input(s): LIPASE, AMYLASE in the last 168 hours. No results for input(s): AMMONIA in the last 168 hours. Coagulation Profile: No results for input(s): INR, PROTIME in the last 168 hours. Cardiac Enzymes:  Recent Labs Lab 11/01/15 1944  Nov 25, 2015 0251 2015/11/25 0800 2015/11/25 1445 11/25/15 2035  TROPONINI 0.13* 0.11* 0.39* 0.25* 0.15*   BNP (last 3 results) No results for input(s): PROBNP in the last 8760 hours. HbA1C: No results for input(s): HGBA1C in the last 72 hours. CBG:  Recent Labs Lab 11/05/15 1137 11/05/15 1656 11/05/15 2300 11/06/15 0802 11/06/15 1207  GLUCAP 248* 207* 204* 203* 204*   Lipid Profile: No results for input(s): CHOL, HDL, LDLCALC, TRIG, CHOLHDL, LDLDIRECT in the last 72 hours. Thyroid Function Tests: No results for input(s): TSH, T4TOTAL, FREET4, T3FREE, THYROIDAB in the last 72 hours. Anemia Panel:  Recent Labs  11/04/15 0424  FERRITIN 1140*  TIBC NOT CALCULATED  IRON 35*   Urine analysis:  Component Value Date/Time   COLORURINE YELLOW 10/29/2015 1020   APPEARANCEUR TURBID* 10/29/2015 1020   LABSPEC 1.026 10/29/2015 1020   PHURINE 5.0 10/29/2015 1020   GLUCOSEU NEGATIVE 10/29/2015 1020   HGBUR MODERATE* 10/29/2015 1020   BILIRUBINUR NEGATIVE 10/29/2015 1020   KETONESUR NEGATIVE 10/29/2015 1020   PROTEINUR 100* 10/29/2015 1020   UROBILINOGEN 1.0 08/20/2014 1042   NITRITE NEGATIVE 10/29/2015 1020   LEUKOCYTESUR MODERATE* 10/29/2015 1020   Recent Results (from the past 240 hour(s))  C difficile quick scan w PCR reflex     Status: None   Collection Time: 10/29/15  4:34 PM  Result Value Ref Range Status   C Diff antigen NEGATIVE NEGATIVE Final   C Diff toxin NEGATIVE NEGATIVE Final   C Diff interpretation Negative for toxigenic C. difficile  Final  Culture, Urine     Status: None   Collection Time: 10/29/15  7:36 PM  Result Value Ref Range Status   Specimen Description URINE, RANDOM  Final   Special Requests NONE  Final   Culture NO GROWTH 1 DAY  Final   Report Status 10/30/2015 FINAL  Final  MRSA PCR Screening     Status: None   Collection Time: 10/30/15  7:24 PM  Result Value Ref Range Status   MRSA by PCR NEGATIVE NEGATIVE Final    Comment:        The  GeneXpert MRSA Assay (FDA approved for NASAL specimens only), is one component of a comprehensive MRSA colonization surveillance program. It is not intended to diagnose MRSA infection nor to guide or monitor treatment for MRSA infections.   Culture, blood (routine x 2)     Status: None   Collection Time: 11/01/15 12:20 AM  Result Value Ref Range Status   Specimen Description BLOOD RIGHT ARM  Final   Special Requests IN PEDIATRIC BOTTLE  Final   Culture NO GROWTH 5 DAYS  Final   Report Status 11/06/2015 FINAL  Final  Culture, blood (routine x 2)     Status: None   Collection Time: 11/01/15 12:46 AM  Result Value Ref Range Status   Specimen Description BLOOD LEFT HAND  Final   Special Requests BOTTLES DRAWN AEROBIC AND ANAEROBIC  Final   Culture NO GROWTH 5 DAYS  Final   Report Status 11/06/2015 FINAL  Final      Anti-infectives    Start     Dose/Rate Route Frequency Ordered Stop   11/03/15 1200  Ampicillin-Sulbactam (UNASYN) 3 g in sodium chloride 0.9 % 100 mL IVPB     3 g 100 mL/hr over 60 Minutes Intravenous Every 8 hours 11/03/15 1104     10/31/15 1300  ampicillin-sulbactam (UNASYN) 1.5 g in sodium chloride 0.9 % 50 mL IVPB  Status:  Discontinued     1.5 g 100 mL/hr over 30 Minutes Intravenous Every 12 hours 10/31/15 1214 11/03/15 1104   10/29/15 2000  metroNIDAZOLE (FLAGYL) IVPB 500 mg  Status:  Discontinued     500 mg 100 mL/hr over 60 Minutes Intravenous Every 8 hours 10/29/15 1852 10/30/15 1312   10/29/15 0800  vancomycin (VANCOCIN) IVPB 750 mg/150 ml premix  Status:  Discontinued     750 mg 150 mL/hr over 60 Minutes Intravenous Every 24 hours 10/28/15 1058 10/31/15 0750   10/21/2015 0800  vancomycin (VANCOCIN) 500 mg in sodium chloride 0.9 % 100 mL IVPB  Status:  Discontinued     500 mg 100 mL/hr over 60 Minutes Intravenous Every 12 hours  10/14/2015 1931 10/28/15 1058   10/07/2015 0200  piperacillin-tazobactam (ZOSYN) IVPB 3.375 g  Status:  Discontinued     3.375  g 12.5 mL/hr over 240 Minutes Intravenous Every 8 hours 10/13/2015 1938 10/31/15 1205   11/04/2015 2000  piperacillin-tazobactam (ZOSYN) IVPB 3.375 g     3.375 g 100 mL/hr over 30 Minutes Intravenous  Once 10/19/2015 1903 10/21/2015 2117   10/28/2015 2000  vancomycin (VANCOCIN) IVPB 1000 mg/200 mL premix     1,000 mg 200 mL/hr over 60 Minutes Intravenous  Once 10/14/2015 1903 10/18/2015 2147       Radiology Studies: No results found.      Scheduled Meds: . ampicillin-sulbactam (UNASYN) IV  3 g Intravenous Q8H  . aspirin  325 mg Oral QHS  . atorvastatin  80 mg Oral q1800  . carvedilol  6.25 mg Oral BID WC  . cholecalciferol  2,000 Units Oral Daily  . darbepoetin (ARANESP) injection - NON-DIALYSIS  60 mcg Subcutaneous Q14 Days  . docusate sodium  100 mg Oral BID  . furosemide  20 mg Oral Daily  . heparin subcutaneous  5,000 Units Subcutaneous Q8H  . insulin aspart  0-9 Units Subcutaneous TID WC  . insulin glargine  15 Units Subcutaneous QHS  . thiamine  100 mg Oral Daily   Continuous Infusions:     LOS: 11 days    Time spent: 25 min   Penny Pia, DO [facility] Pager [phone]  If 7PM-7AM, please contact night-coverage [email or web address] Password [email or web address] 11/06/2015, 5:04 PM

## 2015-11-06 NOTE — Progress Notes (Signed)
4 Days Post-Op  Subjective: Pt reports some increased perianal pain  Objective: Vital signs in last 24 hours: Temp:  [97.3 F (36.3 C)-99 F (37.2 C)] 97.3 F (36.3 C) (05/01 0601) Pulse Rate:  [80-88] 80 (05/01 0601) Resp:  [17-20] 18 (05/01 0601) BP: (118-135)/(68-71) 118/68 mmHg (05/01 0601) SpO2:  [97 %-100 %] 97 % (05/01 0601) Last BM Date: 11/04/15  Intake/Output from previous day: 04/30 0701 - 05/01 0700 In: 480 [P.O.:480] Out: 1150 [Urine:1150] Intake/Output this shift: Total I/O In: -  Out: 350 [Urine:350]  Patient currently sitting in stool Perianal wound completely covered in stool and macerated Penrose in place I can not see any purulence, but hard to tell  Lab Results:   Recent Labs  11/04/15 0424 11/06/15 0631  WBC 34.7* 43.3*  HGB 7.5* 7.4*  HCT 23.6* 22.3*  PLT 464* 575*   BMET  Recent Labs  11/05/15 0728 11/06/15 0631  NA 132* 132*  K 3.7 3.9  CL 105 103  CO2 17* 18*  GLUCOSE 226* 212*  BUN 34* 34*  CREATININE 1.84* 1.74*  CALCIUM 7.6* 7.7*   PT/INR No results for input(s): LABPROT, INR in the last 72 hours. ABG No results for input(s): PHART, HCO3 in the last 72 hours.  Invalid input(s): PCO2, PO2  Studies/Results: No results found.  Anti-infectives: Anti-infectives    Start     Dose/Rate Route Frequency Ordered Stop   11/03/15 1200  Ampicillin-Sulbactam (UNASYN) 3 g in sodium chloride 0.9 % 100 mL IVPB     3 g 100 mL/hr over 60 Minutes Intravenous Every 8 hours 11/03/15 1104     10/31/15 1300  ampicillin-sulbactam (UNASYN) 1.5 g in sodium chloride 0.9 % 50 mL IVPB  Status:  Discontinued     1.5 g 100 mL/hr over 30 Minutes Intravenous Every 12 hours 10/31/15 1214 11/03/15 1104   10/29/15 2000  metroNIDAZOLE (FLAGYL) IVPB 500 mg  Status:  Discontinued     500 mg 100 mL/hr over 60 Minutes Intravenous Every 8 hours 10/29/15 1852 10/30/15 1312   10/29/15 0800  vancomycin (VANCOCIN) IVPB 750 mg/150 ml premix  Status:   Discontinued     750 mg 150 mL/hr over 60 Minutes Intravenous Every 24 hours 10/28/15 1058 10/31/15 0750   10/21/2015 0800  vancomycin (VANCOCIN) 500 mg in sodium chloride 0.9 % 100 mL IVPB  Status:  Discontinued     500 mg 100 mL/hr over 60 Minutes Intravenous Every 12 hours 10/13/2015 1931 10/28/15 1058   10/16/2015 0200  piperacillin-tazobactam (ZOSYN) IVPB 3.375 g  Status:  Discontinued     3.375 g 12.5 mL/hr over 240 Minutes Intravenous Every 8 hours 10/22/2015 1938 10/31/15 1205   10/22/2015 2000  piperacillin-tazobactam (ZOSYN) IVPB 3.375 g     3.375 g 100 mL/hr over 30 Minutes Intravenous  Once 10/08/2015 1903 10/22/2015 2117   10/19/2015 2000  vancomycin (VANCOCIN) IVPB 1000 mg/200 mL premix     1,000 mg 200 mL/hr over 60 Minutes Intravenous  Once 10/15/2015 1903 10/29/2015 2147      Assessment/Plan: s/p Procedure(s): TRANSESOPHAGEAL ECHOCARDIOGRAM (TEE) (N/A)  S/p I and D large perianal abscess  WBC increasing.  I think this needs to be washed out in the OR again tomorrow.  Again, the biggest issue is him continuing to have BM's into the wound.  I recommend that he consider a colostomy given his limited mobility.  He will think about it. Will make NPO for potential return to the OR tomorrow by Dr.  Tsuei who is our [employer] adviser this week.  LOS: 11 days    Shawntel Farnworth A 11/06/2015

## 2015-11-06 NOTE — Progress Notes (Signed)
Pt continues not to be at a level to tolerate more intense therapies. SNF is recommended at this time. [phone]

## 2015-11-06 NOTE — Progress Notes (Signed)
Patient performed NIF-24 and VC .65 L after three attempts.

## 2015-11-06 NOTE — Progress Notes (Signed)
Physical Therapy Treatment Patient Details Name: MARKICE HALLIWELL MRN: [id number] DOB: 08-21-1975 Today's Date: 11/06/2015    History of Present Illness Pt is a 40 y/o male who presents s/p I&D of L gluteal abscess. Pt had been seen in ED a few days prior to admission and also had an I&D done at that time.PMH significant for DM. MRI 4/24 revealed Multiple small foci of restricted diffusion in both cerebral hemispheres most consistent with embolic infarctions.     PT Comments    Patient continues to be limited by pain which he reports is from penrose. Continue to progress as tolerated with attempt of OOB mobility next session.   Follow Up Recommendations  CIR;Supervision/Assistance - 24 hour     Equipment Recommendations  Rolling walker with 5" wheels;3in1 (PT)    Recommendations for Other Services Rehab consult     Precautions / Restrictions Precautions Precautions: Fall Restrictions Weight Bearing Restrictions: No    Mobility  Bed Mobility Overal bed mobility: Needs Assistance Bed Mobility: Supine to Sit;Sit to Supine     Supine to sit: Max assist;HOB elevated Sit to supine: Max assist;+2 for physical assistance   General bed mobility comments: tactile and verbal cues for hand placement and sequencing with physical assist to bring bilat LE to EOB, reach and hold onto bed rail, and elevate trunk into sitting; pt c/o lightheadedness and nausea upon sitting and sever pain from penrose; pt returned to supine with max A +2 to elevate bilat LE into bed and position   Transfers                 General transfer comment: Unable  Ambulation/Gait             General Gait Details: Unable   Stairs            Wheelchair Mobility    Modified Rankin (Stroke Patients Only)       Balance   Sitting-balance support: Bilateral upper extremity supported;Feet supported Sitting balance-Leahy Scale: Poor Sitting balance - Comments: min guard for sitting balance                            Cognition Arousal/Alertness: Awake/alert Behavior During Therapy: Flat affect Overall Cognitive Status: Within Functional Limits for tasks assessed                      Exercises General Exercises - Lower Extremity Ankle Circles/Pumps: AROM;Both;10 reps;Supine    General Comments General comments (skin integrity, edema, etc.): educated pt on positioning of bilat UE and LE       Pertinent Vitals/Pain Pain Assessment: Faces Faces Pain Scale: Hurts worst Pain Location: buttocks Pain Descriptors / Indicators: Grimacing;Crying ("it hurts") Pain Intervention(s): Limited activity within patient's tolerance;Monitored during session;Premedicated before session;Repositioned    Home Living                      Prior Function            PT Goals (current goals can now be found in the care plan section) Acute Rehab PT Goals Patient Stated Goal: be able to walk by Friday Progress towards PT goals: Not progressing toward goals - comment (limited by pain)    Frequency  Min 3X/week    PT Plan Current plan remains appropriate    Co-evaluation             End of Session Equipment  Utilized During Treatment: Gait belt Activity Tolerance: Patient limited by pain Patient left: in bed;with bed alarm set;with call bell/phone within reach;with family/visitor present     Time: 1610-9604 PT Time Calculation (min) (ACUTE ONLY): 17 min  Charges:  $Therapeutic Activity: 8-22 mins                    G Codes:      Derek Mound, PTA Pager: [phone]   11/06/2015, 12:38 PM

## 2015-11-06 DEATH — deceased

## 2015-11-07 ENCOUNTER — Inpatient Hospital Stay (HOSPITAL_COMMUNITY): Payer: Managed Care, Other (non HMO) | Admitting: Certified Registered Nurse Anesthetist

## 2015-11-07 ENCOUNTER — Encounter (HOSPITAL_COMMUNITY): Payer: Self-pay | Admitting: Certified Registered Nurse Anesthetist

## 2015-11-07 ENCOUNTER — Inpatient Hospital Stay: Payer: 59 | Admitting: Family Medicine

## 2015-11-07 ENCOUNTER — Encounter (HOSPITAL_COMMUNITY): Admission: EM | Disposition: E | Payer: Self-pay | Source: Home / Self Care | Attending: Family Medicine

## 2015-11-07 HISTORY — PX: INCISION AND DRAINAGE PERIRECTAL ABSCESS: SHX1804

## 2015-11-07 LAB — GLUCOSE, CAPILLARY
GLUCOSE-CAPILLARY: 187 mg/dL — AB (ref 65–99)
GLUCOSE-CAPILLARY: 170 mg/dL — AB (ref 65–99)
GLUCOSE-CAPILLARY: 174 mg/dL — AB (ref 65–99)
Glucose-Capillary: 222 mg/dL — ABNORMAL HIGH (ref 65–99)
Glucose-Capillary: 154 mg/dL — ABNORMAL HIGH (ref 65–99)
Glucose-Capillary: 135 mg/dL — ABNORMAL HIGH (ref 65–99)

## 2015-11-07 LAB — RENAL FUNCTION PANEL
ANION GAP: 13 (ref 5–15)
Albumin: <1 g/dL — ABNORMAL LOW (ref 3.5–5.0)
BUN: 38 mg/dL — ABNORMAL HIGH (ref 6–20)
CHLORIDE: 104 mmol/L (ref 101–111)
CO2: 17 mmol/L — AB (ref 22–32)
Calcium: 7.7 mg/dL — ABNORMAL LOW (ref 8.9–10.3)
Creatinine, Ser: 2.1 mg/dL — ABNORMAL HIGH (ref 0.61–1.24)
GFR calc non Af Amer: 38 mL/min — ABNORMAL LOW (ref >60)
GFR, EST AFRICAN AMERICAN: 44 mL/min — AB (ref >60)
Glucose, Bld: 194 mg/dL — ABNORMAL HIGH (ref 65–99)
Phosphorus: 4.6 mg/dL (ref 2.5–4.6)
Potassium: 3.8 mmol/L (ref 3.5–5.1)
Sodium: 134 mmol/L — ABNORMAL LOW (ref 135–145)

## 2015-11-07 LAB — SURGICAL PCR SCREEN
MRSA, PCR: NEGATIVE
STAPHYLOCOCCUS AUREUS: NEGATIVE

## 2015-11-07 SURGERY — INCISION AND DRAINAGE, ABSCESS, PERIRECTAL
Anesthesia: General | Site: Buttocks

## 2015-11-07 MED ORDER — 0.9 % SODIUM CHLORIDE (POUR BTL) OPTIME
TOPICAL | Status: DC | PRN
  Administered 2015-11-07: 1000 mL

## 2015-11-07 MED ORDER — ROCURONIUM BROMIDE 100 MG/10ML IV SOLN
INTRAVENOUS | Status: DC | PRN
  Administered 2015-11-07: 10 mg via INTRAVENOUS
  Administered 2015-11-07: 40 mg via INTRAVENOUS

## 2015-11-07 MED ORDER — ONDANSETRON HCL 4 MG/2ML IJ SOLN
INTRAMUSCULAR | Status: AC
  Filled 2015-11-07: qty 2

## 2015-11-07 MED ORDER — PROPOFOL 10 MG/ML IV BOLUS
INTRAVENOUS | Status: DC | PRN
  Administered 2015-11-07: 150 mg via INTRAVENOUS

## 2015-11-07 MED ORDER — HYDROMORPHONE HCL 1 MG/ML IJ SOLN: 0.2500 mg | INTRAMUSCULAR | Status: DC | PRN

## 2015-11-07 MED ORDER — GLUCERNA SHAKE PO LIQD
237.0000 mL | Freq: Three times a day (TID) | ORAL | Status: DC
  Administered 2015-11-07 – 2015-11-08 (×3): 237 mL via ORAL

## 2015-11-07 MED ORDER — GLYCOPYRROLATE 0.2 MG/ML IJ SOLN
INTRAMUSCULAR | Status: DC | PRN
  Administered 2015-11-07: .2 mg via INTRAVENOUS
  Administered 2015-11-07: .4 mg via INTRAVENOUS

## 2015-11-07 MED ORDER — PHENYLEPHRINE HCL 10 MG/ML IJ SOLN
INTRAMUSCULAR | Status: DC | PRN
  Administered 2015-11-07: 160 ug via INTRAVENOUS
  Administered 2015-11-07 (×2): 120 ug via INTRAVENOUS

## 2015-11-07 MED ORDER — NEOSTIGMINE METHYLSULFATE 5 MG/5ML IV SOSY
PREFILLED_SYRINGE | INTRAVENOUS | Status: AC
  Filled 2015-11-07: qty 5

## 2015-11-07 MED ORDER — EPHEDRINE SULFATE 50 MG/ML IJ SOLN
INTRAMUSCULAR | Status: DC | PRN
Start: 2015-11-07 — End: 2015-11-07
  Administered 2015-11-07: 15 mg via INTRAVENOUS

## 2015-11-07 MED ORDER — MIDAZOLAM HCL 5 MG/5ML IJ SOLN
INTRAMUSCULAR | Status: DC | PRN
  Administered 2015-11-07: 2 mg via INTRAVENOUS

## 2015-11-07 MED ORDER — ATROPINE SULFATE 1 MG/ML IJ SOLN
INTRAMUSCULAR | Status: DC | PRN
Start: 2015-11-07 — End: 2015-11-07
  Administered 2015-11-07: 1 mg via INTRAVENOUS

## 2015-11-07 MED ORDER — NEOSTIGMINE METHYLSULFATE 10 MG/10ML IV SOLN
INTRAVENOUS | Status: DC | PRN
  Administered 2015-11-07: 2 mg via INTRAVENOUS

## 2015-11-07 MED ORDER — LACTATED RINGERS IV SOLN
INTRAVENOUS | Status: DC | PRN
  Administered 2015-11-07 (×2): via INTRAVENOUS

## 2015-11-07 MED ORDER — ROCURONIUM BROMIDE 50 MG/5ML IV SOLN
INTRAVENOUS | Status: AC
  Filled 2015-11-07: qty 1

## 2015-11-07 MED ORDER — DEXTROSE 5 % IV SOLN
10.0000 mg | INTRAVENOUS | Status: DC | PRN
  Administered 2015-11-07: 30 ug/min via INTRAVENOUS

## 2015-11-07 MED ORDER — ADULT MULTIVITAMIN W/MINERALS CH
1.0000 | ORAL_TABLET | Freq: Every day | ORAL | Status: DC
  Administered 2015-11-07 – 2015-11-13 (×6): 1 via ORAL
  Filled 2015-11-07 (×7): qty 1

## 2015-11-07 MED ORDER — SUGAMMADEX SODIUM 200 MG/2ML IV SOLN
INTRAVENOUS | Status: DC | PRN
  Administered 2015-11-07: 200 mg via INTRAVENOUS

## 2015-11-07 MED ORDER — FENTANYL CITRATE (PF) 100 MCG/2ML IJ SOLN
INTRAMUSCULAR | Status: DC | PRN
  Administered 2015-11-07: 75 ug via INTRAVENOUS

## 2015-11-07 MED ORDER — MEPERIDINE HCL 25 MG/ML IJ SOLN: 6.2500 mg | INTRAMUSCULAR | Status: DC | PRN

## 2015-11-07 MED ORDER — ONDANSETRON HCL 4 MG/2ML IJ SOLN
INTRAMUSCULAR | Status: DC | PRN
  Administered 2015-11-07: 4 mg via INTRAVENOUS

## 2015-11-07 MED ORDER — GLYCOPYRROLATE 0.2 MG/ML IV SOSY
PREFILLED_SYRINGE | INTRAVENOUS | Status: AC
  Filled 2015-11-07: qty 3

## 2015-11-07 MED ORDER — LIDOCAINE HCL (CARDIAC) 20 MG/ML IV SOLN
INTRAVENOUS | Status: DC | PRN
  Administered 2015-11-07: 60 mg via INTRAVENOUS

## 2015-11-07 MED ORDER — MIDAZOLAM HCL 2 MG/2ML IJ SOLN
INTRAMUSCULAR | Status: AC
  Filled 2015-11-07: qty 2

## 2015-11-07 MED ORDER — FENTANYL CITRATE (PF) 250 MCG/5ML IJ SOLN
INTRAMUSCULAR | Status: AC
  Filled 2015-11-07: qty 5

## 2015-11-07 SURGICAL SUPPLY — 33 items
BNDG GAUZE ELAST 4 BULKY (GAUZE/BANDAGES/DRESSINGS) ×3 IMPLANT
COVER MAYO STAND STRL (DRAPES) IMPLANT
COVER SURGICAL LIGHT HANDLE (MISCELLANEOUS) ×3 IMPLANT
DRAPE LAPAROTOMY 100X72 PEDS (DRAPES) ×3 IMPLANT
DRAPE PROXIMA HALF (DRAPES) ×3 IMPLANT
DRSG MEPILEX BORDER 4X8 (GAUZE/BANDAGES/DRESSINGS) ×3 IMPLANT
ELECT CAUTERY BLADE 6.4 (BLADE) ×3 IMPLANT
ELECT REM PT RETURN 9FT ADLT (ELECTROSURGICAL) ×3
ELECTRODE REM PT RTRN 9FT ADLT (ELECTROSURGICAL) ×1 IMPLANT
GAUZE SPONGE 4X4 12PLY STRL (GAUZE/BANDAGES/DRESSINGS) IMPLANT
GLOVE BIO SURGEON STRL SZ7 (GLOVE) ×3 IMPLANT
GLOVE BIOGEL PI IND STRL 7.5 (GLOVE) ×2 IMPLANT
GLOVE BIOGEL PI INDICATOR 7.5 (GLOVE) ×4
GOWN STRL REUS W/ TWL LRG LVL3 (GOWN DISPOSABLE) ×3 IMPLANT
GOWN STRL REUS W/TWL LRG LVL3 (GOWN DISPOSABLE) ×6
HANDPIECE INTERPULSE COAX TIP (DISPOSABLE) ×2
KIT BASIN OR (CUSTOM PROCEDURE TRAY) ×3 IMPLANT
KIT ROOM TURNOVER OR (KITS) ×3 IMPLANT
NS IRRIG 1000ML POUR BTL (IV SOLUTION) ×3 IMPLANT
PACK GENERAL/GYN (CUSTOM PROCEDURE TRAY) ×3 IMPLANT
PACK LITHOTOMY IV (CUSTOM PROCEDURE TRAY) IMPLANT
PAD ABD 8X10 STRL (GAUZE/BANDAGES/DRESSINGS) ×6 IMPLANT
PAD ARMBOARD 7.5X6 YLW CONV (MISCELLANEOUS) ×3 IMPLANT
PENCIL BUTTON HOLSTER BLD 10FT (ELECTRODE) IMPLANT
SET HNDPC FAN SPRY TIP SCT (DISPOSABLE) ×1 IMPLANT
SPONGE LAP 18X18 X RAY DECT (DISPOSABLE) IMPLANT
SURGILUBE 2OZ TUBE FLIPTOP (MISCELLANEOUS) IMPLANT
SYR BULB 3OZ (MISCELLANEOUS) ×3 IMPLANT
TOWEL OR 17X24 6PK STRL BLUE (TOWEL DISPOSABLE) ×3 IMPLANT
TOWEL OR 17X26 10 PK STRL BLUE (TOWEL DISPOSABLE) ×3 IMPLANT
TUBE CONNECTING 12'X1/4 (SUCTIONS)
TUBE CONNECTING 12X1/4 (SUCTIONS) IMPLANT
YANKAUER SUCT BULB TIP NO VENT (SUCTIONS) IMPLANT

## 2015-11-07 NOTE — Progress Notes (Signed)
PROGRESS NOTE    [name]  WUJ:[id number] DOB: [birth year] DOA: 10/14/2015 PCP: Jaclyn Shaggy, MD   Outpatient Specialists: Jaclyn Shaggy, MD  Brief Narrative:  [facility] Dougherty is a 40 y.o. male with medical history significant of diabetes. He complains for diarrhea x 1 month. Nothing made better or worse. No history of crohn's/UC- no family hx either. He then a few days before 4/16 developed left buttock pain. He was seen in the [facility] where he was diagnosed with intergluteal infection with small abscess. An I/D was done that some purulent drainage. He was given norco, clindamycin and told to do warm soaks.  He presented back at [facility] [place] with worsening pain and pressure. tx to [facility] for further I&D in operating room.  Had urinary retention and foley placed.  Creatine and Urine output did not respond to fluid boluses.  Assessment & Plan:   Active Problems:   Hyperglycemia due to type 2 diabetes mellitus (HCC)   Hyponatremia   Essential hypertension   Diabetic neuropathy, type II diabetes mellitus (HCC)   Peri-rectal abscess   CKD (chronic kidney disease)   Leukocytosis   AKI (acute kidney injury) (HCC)   Pressure ulcer   Abscess, gluteal, left   Precordial pain   NSTEMI (non-ST elevated myocardial infarction) (HCC)   Pericardial effusion   Stroke (HCC)   Perirectal abscess   Peri-rectal abscess - s/p I&D - blood cultures- NGTD - culture from I&D: gram + cocci in pairs- group B strep - HIV negative - pain control - Continue IV Abx- Unasyn - Gen. surgery considering taking to the OR for debridement.  Gen surgery recommending colostomy to prevent reeinfection from BM's  ? Crohn's disease GI consult and recommended primary treatment of his perirectal abscess. With improvement in renal function they recommend more advanced imaging to evaluate small bowel such a CT enterography or MR enterography. We will plan on having GI evaluated patient as an  outpatient.  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 Creatinine currently improving daily. -U/A ordered  Severe protein calorie malnutrition -albumin 1  Weakness -states he is unable to move LE (walked with cane before admission.Marland Kitchen) -sensation intact -B12 Thiamine level- IV thiamine - Neurology on board while patient in-house and currently suspecting anterior spinal artery ASA infarct area, GBS or transverse myelitis unlikely per their report - SNF once surgeon cleared for discharge.  Elevated troponin - consulted cardiology - TEE negative and cardiology has signed off after their final recommendations. Please refer to their note for details. Will need further work up once condition improves  DVT prophylaxis:  Lovenox  Code Status: Full Code   Family Communication: none discussed with patient directly   Disposition Plan:  tx to SDU   Consultants:   CCS  nephrology  Procedures:   I&D   Subjective: Patient was taken to OR  Objective: Filed Vitals:   12/03/2015 1505 11/30/2015 1515 11/19/2015 1520  12/03/2015 1530  BP: 149/81  131/76   Pulse: 92 90 88 89  Temp:      TempSrc:      Resp: Height:      Weight:      SpO2: 99% 100% 100% 100%    Intake/Output Summary (Last 24 hours) at 11/06/2015 1536 Last data filed at 11/17/2015 1441  Gross per 24 hour  Intake   1460 ml  Output    550 ml  Net    910 ml   Filed Weights    10/25/2015 1154 10/20/2015 1800  Weight: 65.318 kg (144 lb) 67.132 kg (148 lb)    Examination:  VSS will reassess next am.   Data Reviewed: I have personally reviewed following labs and imaging studies  CBC:  Recent Labs Lab 11/01/15 0538 10/31/2015 0252 11/03/15 0445 11/04/15 0424 11/06/15 0631  WBC 50.6* 42.6* 40.2* 34.7* 43.3*  NEUTROABS 47.6*  --   --   --   --   HGB 8.6* 9.2* 8.0* 7.5* 7.4*  HCT 25.2* 27.5* 23.9* 23.6* 22.3*  MCV 77.5* 77.2* 78.1 80.3 80.5  PLT 482* 450* 484* 464* 575*   Basic Metabolic Panel:  Recent Labs Lab 11/01/15 1331  11/03/15 0445 11/04/15 0424 11/05/15 0728 11/06/15 0631 11/13/2015 0717  NA  --   < > 133* 135 132* 132* 134*  K  --   < > 3.5 4.3 3.7 3.9 3.8  CL  --   < > 106 108 105 103 104  CO2  --   < > 16* 15* 17* 18* 17*  GLUCOSE  --   < > 212* 179* 226* 212* 194*  BUN  --   < > 41* 37* 34* 34* 38*  CREATININE  --   < > 2.06* 1.93* 1.84* 1.74* 2.10*  CALCIUM  --   < > 7.4* 7.6* 7.6* 7.7* 7.7*  MG 1.9  --   --   --   --   --   --   PHOS  --   < > 3.7 3.8 4.1 4.7* 4.6  < > = values in this interval not displayed. GFR: Estimated Creatinine Clearance: 43.7 mL/min (by C-G formula based on Cr of 2.1). Liver Function Tests:  Recent Labs Lab 11/03/15 0445 11/04/15 0424 11/05/15 0728 11/06/15 0631 11/13/2015 0717  ALBUMIN 1.0* 1.1* 1.0* <1.0* <1.0*   No results for input(s): LIPASE, AMYLASE in the last 168 hours. No results for input(s): AMMONIA in the last 168 hours. Coagulation Profile: No results for input(s): INR, PROTIME in the last 168 hours. Cardiac Enzymes:  Recent Labs Lab 11/01/15 1944 10/16/2015 0251 10/16/2015 0800 10/12/2015 1445 10/25/2015 2035  TROPONINI 0.13* 0.11* 0.39* 0.25* 0.15*   BNP (last 3 results) No results for input(s): PROBNP in the last 8760 hours. HbA1C: No results for input(s): HGBA1C in the last 72 hours. CBG:  Recent Labs Lab 11/06/15 1731 11/06/15 2137 11/12/2015 0821 11/13/2015 1142  11/10/2015 1436  GLUCAP 230* 222* 187* 170* 154*   Lipid Profile: No results for input(s): CHOL, HDL, LDLCALC, TRIG, CHOLHDL, LDLDIRECT in the last 72 hours. Thyroid Function Tests: No results for input(s): TSH, T4TOTAL, FREET4, T3FREE, THYROIDAB in the last 72 hours. Anemia Panel: No results for input(s): VITAMINB12, FOLATE, FERRITIN, TIBC, IRON, RETICCTPCT in the last 72 hours. Urine analysis:    Component Value Date/Time   COLORURINE YELLOW 10/29/2015 1020   APPEARANCEUR TURBID* 10/29/2015 1020   LABSPEC 1.026 10/29/2015 1020  PHURINE 5.0 10/29/2015 1020   GLUCOSEU NEGATIVE 10/29/2015 1020   HGBUR MODERATE* 10/29/2015 1020   BILIRUBINUR NEGATIVE 10/29/2015 1020   KETONESUR NEGATIVE 10/29/2015 1020   PROTEINUR 100* 10/29/2015 1020   UROBILINOGEN 1.0 08/20/2014 1042   NITRITE NEGATIVE 10/29/2015 1020   LEUKOCYTESUR MODERATE* 10/29/2015 1020   Recent Results (from the past 240 hour(s))  C difficile quick scan w PCR reflex     Status: None   Collection Time: 10/29/15  4:34 PM  Result Value Ref Range Status   C Diff antigen NEGATIVE NEGATIVE Final   C Diff toxin NEGATIVE NEGATIVE Final   C Diff interpretation Negative for toxigenic C. difficile  Final  Culture, Urine     Status: None   Collection Time: 10/29/15  7:36 PM  Result Value Ref Range Status   Specimen Description URINE, RANDOM  Final   Special Requests NONE  Final   Culture NO GROWTH 1 DAY  Final   Report Status 10/30/2015 FINAL  Final  MRSA PCR Screening     Status: None   Collection Time: 10/30/15  7:24 PM  Result Value Ref Range Status   MRSA by PCR NEGATIVE NEGATIVE Final    Comment:        The GeneXpert MRSA Assay (FDA approved for NASAL specimens only), is one component of a comprehensive MRSA colonization surveillance program. It is not intended to diagnose MRSA infection nor to guide or monitor treatment for MRSA infections.   Culture, blood (routine x 2)     Status: None   Collection Time:  11/01/15 12:20 AM  Result Value Ref Range Status   Specimen Description BLOOD RIGHT ARM  Final   Special Requests IN PEDIATRIC BOTTLE  Final   Culture NO GROWTH 5 DAYS  Final   Report Status 11/06/2015 FINAL  Final  Culture, blood (routine x 2)     Status: None   Collection Time: 11/01/15 12:46 AM  Result Value Ref Range Status   Specimen Description BLOOD LEFT HAND  Final   Special Requests BOTTLES DRAWN AEROBIC AND ANAEROBIC  Final   Culture NO GROWTH 5 DAYS  Final   Report Status 11/06/2015 FINAL  Final  Surgical pcr screen     Status: None   Collection Time: 11/15/2015  4:43 AM  Result Value Ref Range Status   MRSA, PCR NEGATIVE NEGATIVE Final   Staphylococcus aureus NEGATIVE NEGATIVE Final    Comment:        The Xpert SA Assay (FDA approved for NASAL specimens in patients over 93 years of age), is one component of a comprehensive surveillance program.  Test performance has been validated by [facility] for patients greater than or equal to 60 year old. It is not intended to diagnose infection nor to guide or monitor treatment.       Anti-infectives    Start     Dose/Rate Route Frequency Ordered Stop   11/03/15 1200  [MAR Hold]  Ampicillin-Sulbactam (UNASYN) 3 g in sodium chloride 0.9 % 100 mL IVPB     (MAR Hold since 11/26/2015 1151)   3 g 100 mL/hr over 60 Minutes Intravenous Every 8 hours 11/03/15 1104     10/31/15 1300  ampicillin-sulbactam (UNASYN) 1.5 g in sodium chloride 0.9 % 50 mL IVPB  Status:  Discontinued     1.5 g 100 mL/hr over 30 Minutes Intravenous Every 12 hours 10/31/15 1214 11/03/15 1104   10/29/15 2000  metroNIDAZOLE (FLAGYL) IVPB 500 mg  Status:  Discontinued     500 mg 100 mL/hr over 60 Minutes Intravenous Every 8 hours 10/29/15 1852 10/30/15 1312   10/29/15 0800  vancomycin (VANCOCIN) IVPB 750 mg/150 ml premix  Status:  Discontinued     750 mg 150 mL/hr over 60 Minutes Intravenous Every 24 hours 10/28/15 1058 10/31/15 0750   11/18/2015 0800   vancomycin (VANCOCIN) 500 mg in sodium chloride 0.9 % 100 mL IVPB  Status:  Discontinued     500 mg 100 mL/hr over 60 Minutes Intravenous Every 12 hours 10/12/2015 1931 10/28/15 1058   11-18-2015 0200  piperacillin-tazobactam (ZOSYN) IVPB 3.375 g  Status:  Discontinued     3.375 g 12.5 mL/hr over 240 Minutes Intravenous Every 8 hours 10/24/2015 1938 10/31/15 1205   10/12/2015 2000  piperacillin-tazobactam (ZOSYN) IVPB 3.375 g     3.375 g 100 mL/hr over 30 Minutes Intravenous  Once 10/19/2015 1903 11/05/2015 2117   11/03/2015 2000  vancomycin (VANCOCIN) IVPB 1000 mg/200 mL premix     1,000 mg 200 mL/hr over 60 Minutes Intravenous  Once 10/10/2015 1903 10/24/2015 2147       Radiology Studies: No results found.      Scheduled Meds: . [MAR Hold] ampicillin-sulbactam (UNASYN) IV  3 g Intravenous Q8H  . [MAR Hold] aspirin  325 mg Oral QHS  . [MAR Hold] atorvastatin  80 mg Oral q1800  . [MAR Hold] carvedilol  6.25 mg Oral BID WC  . [MAR Hold] cholecalciferol  2,000 Units Oral Daily  . [MAR Hold] darbepoetin (ARANESP) injection - NON-DIALYSIS  60 mcg Subcutaneous Q14 Days  . [MAR Hold] docusate sodium  100 mg Oral BID  . feeding supplement (GLUCERNA SHAKE)  237 mL Oral TID BM  . [MAR Hold] furosemide  20 mg Oral Daily  . [MAR Hold] heparin subcutaneous  5,000 Units Subcutaneous Q8H  . [MAR Hold] insulin aspart  0-9 Units Subcutaneous TID WC  . [MAR Hold] insulin glargine  15 Units Subcutaneous QHS  . multivitamin with minerals  1 tablet Oral Daily  . [MAR Hold] thiamine  100 mg Oral Daily   Continuous Infusions:     LOS: 12 days    Time spent: 25 min   Penny Pia, DO [facility] Pager [phone]  If 7PM-7AM, please contact night-coverage [email or web address] Password [facility] 12/03/2015, 3:36 PM

## 2015-11-07 NOTE — Progress Notes (Signed)
NIF -30 and VC 0.9L, Pt demonstrated with good effort and technique.

## 2015-11-07 NOTE — Progress Notes (Signed)
RT Note: NIF -24, VC .67L x 3 attempts. Good pt effort noted, RT will continue to monitor.

## 2015-11-07 NOTE — Anesthesia Postprocedure Evaluation (Signed)
Anesthesia Post Note  Patient: [facility] Dougherty  Procedure(s) Performed: Procedure(s) (LRB): IRRIGATION AND DEBRIDEMENT PERIRECTAL ABSCESS (N/A)  Patient location during evaluation: PACU Anesthesia Type: General Level of consciousness: awake and alert Pain management: pain level controlled Vital Signs Assessment: post-procedure vital signs reviewed and stable Respiratory status: spontaneous breathing, nonlabored ventilation, respiratory function stable and patient connected to nasal cannula oxygen Cardiovascular status: blood pressure returned to baseline and stable Postop Assessment: no signs of nausea or vomiting Anesthetic complications: no    Last Vitals:  Filed Vitals:   11/25/15 1520 Nov 25, 2015 1530  BP: 131/76   Pulse: 88 89  Temp:    Resp: 13 15    Last Pain:  Filed Vitals:   11-25-15 1534  PainSc: 0-No pain                 Lynne Righi,W. EDMOND

## 2015-11-07 NOTE — Anesthesia Procedure Notes (Signed)
Procedure Name: Intubation Date/Time: 11/18/2015 1:09 PM Performed by: Faustino Congress Baylie Drakes Pre-anesthesia Checklist: Patient identified, Emergency Drugs available, Suction available and Patient being monitored Patient Re-evaluated:Patient Re-evaluated prior to inductionOxygen Delivery Method: Circle System Utilized Preoxygenation: Pre-oxygenation with 100% oxygen Intubation Type: IV induction Ventilation: Mask ventilation without difficulty Laryngoscope Size: Mac and 4 Grade View: Grade I Tube type: Oral Tube size: 7.5 mm Number of attempts: 1 Airway Equipment and Method: Stylet and Oral airway Placement Confirmation: ETT inserted through vocal cords under direct vision,  positive ETCO2 and breath sounds checked- equal and bilateral Secured at: 22 cm Tube secured with: Tape Dental Injury: Teeth and Oropharynx as per pre-operative assessment

## 2015-11-07 NOTE — Anesthesia Preprocedure Evaluation (Signed)
Anesthesia Evaluation  Patient identified by MRN, date of birth, ID band Patient awake    Reviewed: Allergy & Precautions, NPO status , Patient's Chart, lab work & pertinent test results  Airway Mallampati: II  TM Distance: >3 FB Neck ROM: Full    Dental no notable dental hx. (+) Teeth Intact   Pulmonary neg pulmonary ROS,    Pulmonary exam normal breath sounds clear to auscultation       Cardiovascular Exercise Tolerance: Good hypertension, Pt. on medications + Past MI   Rhythm:Regular Rate:Normal     Neuro/Psych  Neuromuscular disease CVA negative psych ROS   GI/Hepatic Neg liver ROS, ? Perirectal abscess   Endo/Other  diabetes, Poorly Controlled, Type 1, Insulin Dependent  Renal/GU Renal InsufficiencyRenal disease  negative genitourinary   Musculoskeletal Left gluteal abscess   Abdominal   Peds  Hematology  (+) anemia ,   Anesthesia Other Findings   Reproductive/Obstetrics                             Anesthesia Physical  Anesthesia Plan  ASA: II  Anesthesia Plan: General   Post-op Pain Management:    Induction: Intravenous  Airway Management Planned: Oral ETT  Additional Equipment:   Intra-op Plan:   Post-operative Plan: Extubation in OR  Informed Consent: I have reviewed the patients History and Physical, chart, labs and discussed the procedure including the risks, benefits and alternatives for the proposed anesthesia with the patient or authorized representative who has indicated his/her understanding and acceptance.   Dental advisory given  Plan Discussed with: CRNA  Anesthesia Plan Comments:         Anesthesia Quick Evaluation

## 2015-11-07 NOTE — Care Management Note (Signed)
Case Management Note  Patient Details  Name: [facility] Dougherty MRN: [id number] Date of Birth: 05-07-1976  Subjective/Objective:                 Spoke with patient at the bedside along with [place] CSW. He states that he lives at home with his brother. He states that he was ambulatory prior to admission, however the peri rectal abscess had "infected his spine and caused a stroke in his spine." He states that he is non ambulatory now. Patient has a penrose drain to abscess, and a foley catheter. Patient is for I&D washout today of abscess and eval for possible diverting colostomy that patient states will be decided intraoperatively. Patient wants SNF at DC to manage wound care and possible colostomy and for PT to assist with restoring ambulation.    Action/Plan:   Expected Discharge Date:  10/30/15               Expected Discharge Plan:  Skilled Nursing Facility  In-House Referral:  Clinical Social Work  Discharge planning Services  CM Consult  Post Acute Care Choice:  NA Choice offered to:     DME Arranged:    DME Agency:     HH Arranged:    HH Agency:     Status of Service:  In process, will continue to follow  Medicare Important Message Given:    Date Medicare IM Given:    Medicare IM give by:    Date Additional Medicare IM Given:    Additional Medicare Important Message give by:     If discussed at Long Length of Stay Meetings, dates discussed:    Additional Comments:  Lawerance Sabal, RN 11/30/2015, 11:31 AM

## 2015-11-07 NOTE — Progress Notes (Signed)
Inpatient Diabetes Program Recommendations  AACE/ADA: New Consensus Statement on Inpatient Glycemic Control (2015)  Target Ranges:  Prepandial:   less than 140 mg/dL      Peak postprandial:   less than 180 mg/dL (1-2 hours)      Critically ill patients:  140 - 180 mg/dL   Review of Glycemic Control Results for GARRET, MERE (MRN [id number]) as of 12/03/2015 10:51  Ref. Range 11/06/2015 08:02 11/06/2015 12:07 11/06/2015 17:31 11/06/2015 21:37 11/30/2015 08:21  Glucose-Capillary Latest Ref Range: 65-99 mg/dL 102 (H) 725 (H) 366 (H) 222 (H) 187 (H)   Home DM Meds: Lantus 25 units QHS  Humalog 10 units tidwc  Current Insulin Orders: Lantus 15 units QHS  Novolog Sensitive Correction Scale/ SSI (0-9 units) TID AC    Noted Fasting CBG 184 today.  MD- Please consider the following in-hospital insulin adjustments to improve glucose control:  1. Increase Lantus to 17 units QHS (home dose of Lantus is 25 units QHS)     Thank you, Darel Hong E. Shantanique Hodo, RN, MSN, CDE Inpatient Glycemic Control Team Team Pager [phone] (8am-5pm) 11/15/2015 10:51 AM

## 2015-11-07 NOTE — Clinical Documentation Improvement (Signed)
Family Medicine General Surgery  Please clarify if the following diagnosis, NSTEMI  was:   Present at the time of admission (POA)  NOT present at the time of admission and it developed during the inpatient stay  Unable to clinically determine whether the condition was present on admission.  Ruled out  Unknown   Supporting Information:    Troponin I <0.031 ng/mL 0.15 (H) 0.25 (H)CM 0.39 (H)CM 0.11 (H)CM 0.13 (H)CM 0.54 (HH)CM 0.32 (H)       C/o of chest pain/pressure on admit    Please exercise your independent, professional judgment when responding. A specific answer is not anticipated or expected.   Thank You,  Lavonda Jumbo Health Information Management [place] [phone]

## 2015-11-07 NOTE — Clinical Documentation Improvement (Signed)
Family Medicine General Surgery  Can the diagnosis of pressure ulcer be further specified by site and stage? Thank you       Document Site with laterality - Elbow, Back (upper/lower), Sacral, Hip, Buttock, Ankle, Heel, Head, Other (Specify)  Pressure Ulcer Stage - Stage1, Stage 2, Stage 3, Stage 4, Unstageable, Unspecified, Unable to Clinically Determine  Other  Clinically Undetermined    Supporting Information:  Nursing assessment on 10/30/15  Stating " Stage II sacrum rt/lt. "   Please exercise your independent, professional judgment when responding. A specific answer is not anticipated or expected.   Thank You,  Lavonda Jumbo Health Information Management [place] [phone]

## 2015-11-07 NOTE — Transfer of Care (Signed)
Immediate Anesthesia Transfer of Care Note  Patient: [place] Dougherty  Procedure(s) Performed: Procedure(s): IRRIGATION AND DEBRIDEMENT PERIRECTAL ABSCESS (N/A)  Patient Location: PACU  Anesthesia Type:General  Level of Consciousness: awake, alert , oriented and patient cooperative  Airway & Oxygen Therapy: Patient Spontanous Breathing and Patient connected to nasal cannula oxygen  Post-op Assessment: Report given to RN and Post -op Vital signs reviewed and stable  Post vital signs: Reviewed and stable  Last Vitals:  Filed Vitals:   11/22/2015 0957 11/13/2015 1118  BP: 116/66 134/67  Pulse: 85 87  Temp:  36.9 C  Resp:  20    Last Pain:  Filed Vitals:   11/25/2015 1130  PainSc: Asleep      Patients Stated Pain Goal: 0 (11/06/2015 1004)  Complications: No apparent anesthesia complications

## 2015-11-07 NOTE — Progress Notes (Signed)
Initial Nutrition Assessment  DOCUMENTATION CODES:   Not applicable  INTERVENTION:   Once diet is advanced, add:  -Glucerna Shake po TID, each supplement provides 220 kcal and 10 grams of protein -MVI daily  NUTRITION DIAGNOSIS:   Increased nutrient needs related to wound healing as evidenced by estimated needs.  GOAL:   Patient will meet greater than or equal to 90% of their needs  MONITOR:   PO intake, Supplement acceptance, Diet advancement, Labs, Weight trends, Skin, I & O's  REASON FOR ASSESSMENT:   Low Braden    ASSESSMENT:   Shane Dougherty is a 40 y.o. male with medical history significant of diabetes. He complains for diarrhea x 1 month. Nothing made better or worse. No history of crohn's/UC- no family hx either. He then a few days before 4/16 developed left buttock pain. He was seen in the ER where he was diagnosed with intergluteal infection with small abscess. An I/D was done that some purulent drainage. He was given norco, clindamycin and told to do warm soaks.   Pt admitted with hyperglycemia and peri-rectal abscess.   Attempted to examine pt x 3, however, pt in with CSW or receiving nursing care at times of visits. Pt was being wheeled down to OR at third attempt. Unable to complete Nutrition-Focused physical exam at this time.   Chart review indicates pt with poor PO intake; noted PO: 10-60%, averaging around 25% meal completion.   Wt hx reviewed; wt has been stable over the past 6 months.   Pt underwent I&D of lt gluteal abscess on 10/30/2015. Per surgery notes, there is concern for ability to keep wound clean and surgeon is considering diverting colostomy.   Case discussed with RN. Pt going down for repeat I&D today. Pt with increased nutrient needs related to wound healing; RD will add supplements once diet is advanced.   Labs reviewed: Na: 134, CBGS: 222-230. Last Hgb A1c: 14.6 on 11-08-15 (down from previous Hgb A1c of 16, per MD notes pt has  been working with MD as an outpatient to optimize glycemic control). Per DM coordinator note, home medications as 25 units Lantus q HS and 10 units Humalog TID WC.   Diet Order:  Diet NPO time specified Except for: Sips with Meds  Skin:  Wound (see comment) (stage II sacrum, closed bilateral rectal incision, open buttock wound)  Last BM:  11/06/15  Height:   Ht Readings from Last 1 Encounters:  2015/11/08 5\' 7"  (1.702 m)    Weight:   Wt Readings from Last 1 Encounters:  Nov 08, 2015 148 lb (67.132 kg)    Ideal Body Weight:  67.13 kg  BMI:  Body mass index is 23.17 kg/(m^2).  Estimated Nutritional Needs:   Kcal:  1800-2000  Protein:  100-115 grams  Fluid:  >1.8 L  EDUCATION NEEDS:   No education needs identified at this time  [name] A. Mayford Knife, RD, LDN, CDE Pager: [phone] After hours Pager: [phone]

## 2015-11-07 NOTE — Progress Notes (Signed)
5 Days Post-Op  Subjective: Seen by Dr. Corliss Skains, going down for a wash out of gluteal wound.  He complains of ongoing back pain and is swollen up and hurts all over.    Objective: Vital signs in last 24 hours: Temp:  [98 F (36.7 C)-99.1 F (37.3 C)] 99.1 F (37.3 C) (05/02 0500) Pulse Rate:  [83-85] 85 (05/02 0957) Resp:  [17-18] 18 (05/02 0500) BP: (116-130)/(64-74) 116/66 mmHg (05/02 0957) SpO2:  [99 %-100 %] 100 % (05/02 0500) Last BM Date: 11/06/15 PO 60 ml recorded Urine 1140 BM x 2 recorded Afebrile, VSS Labs show last WBC 43.3 K  11/17/15  -  The lowest it has been is 34.7 BMP stable, creatinine is trending up Intake/Output from previous day: 05/01 0701 - 05/02 0700 In: 260 [P.O.:60; IV Piggyback:200] Out: 1140 [Urine:1140] Intake/Output this shift:    General appearance: alert, cooperative, mild distress and depressed and sounds rather hopeless, just want to not be sick anymore. GI: soft, no Bowel sounds, he is generally swollen up, scrotum is swollen, ulcer at the base of the scrotum and foley in place.    Lab Results:   Recent Labs  11/06/15 0631  WBC 43.3*  HGB 7.4*  HCT 22.3*  PLT 575*    BMET  Recent Labs  11/06/15 0631 11/06/2015 0717  NA 132* 134*  K 3.9 3.8  CL 103 104  CO2 18* 17*  GLUCOSE 212* 194*  BUN 34* 38*  CREATININE 1.74* 2.10*  CALCIUM 7.7* 7.7*   PT/INR No results for input(s): LABPROT, INR in the last 72 hours.   Recent Labs Lab 11/03/15 0445 11/04/15 0424 11/05/15 0728 11/06/15 0631 11/24/2015 0717  ALBUMIN 1.0* 1.1* 1.0* <1.0* <1.0*     Lipase     Component Value Date/Time   LIPASE 39 08/29/2015 1929     Studies/Results: No results found.  Medications: . ampicillin-sulbactam (UNASYN) IV  3 g Intravenous Q8H  . aspirin  325 mg Oral QHS  . atorvastatin  80 mg Oral q1800  . carvedilol  6.25 mg Oral BID WC  . cholecalciferol  2,000 Units Oral Daily  . darbepoetin (ARANESP) injection - NON-DIALYSIS  60 mcg  Subcutaneous Q14 Days  . docusate sodium  100 mg Oral BID  . furosemide  20 mg Oral Daily  . heparin subcutaneous  5,000 Units Subcutaneous Q8H  . insulin aspart  0-9 Units Subcutaneous TID WC  . insulin glargine  15 Units Subcutaneous QHS  . thiamine  100 mg Oral Daily    Assessment/Plan Left gluteal abscess S/p I&D of left Gluteal abscess, 11/03/2015, Dr. 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 Crohn's disease on CT Hx of Bell's Palsy  Bilateral Lower leg weakness Phimosis - reduced FEN: Carb Mod diet ID: Day 12 antibiotics, currently on Unasyn   No growth on blood cultures Culture left buttocks MODERATE GROUP B STREP(S.AGALACTIAE)ISOLATED          VTE: Heparin/SCD   Plan:  He is going to OR for a wash out today.  Dr. Corliss Skains will decide on colostomy issue later.     LOS: 12 days    Dynasia Kercheval 12/05/2015 [phone]

## 2015-11-07 NOTE — Op Note (Signed)
Prep diagnosis: Necrotic sacral pressure ulcer adjacent to gluteal abscess Postop diagnosis: Same Procedure performed: Incision and debridement of skin subcutaneous tissue and muscle of the sacral region Surgeon:Calyx Hawker K. Assistant:  Dr. Madelin Rear Anesthesia:  GEN Indications: This is a 40 year old male who is status post incision and drainage of a left gluteal abscess all 11/02/2015. Subsequently he developed what appears to be infarct of the spinal cord with bilateral lower extremity weakness. He is quite debilitated. He has had chronic diarrhea and his wound has been chronically contaminated. He presents now for further debridement.  Description of procedure: The patient brought to the operating room and placed in a supine position on the stretcher. After adequate level of general anesthesia was obtained, he was moved a prone position on the operating room table. The area around his lower back buttocks and luteal region or prepped with Betadine and draped sterile fashion. The Penrose drain is intact. This was removed. The skin around the previous incision is obviously necrotic. This necrosis extends up over the coccyx onto the sacrum. Further up on the sacrum there is early signs of breakdown. This extends across both sides.  Using sharp dissection as well as cautery we excised the skin is substance tissue of the necrotic area. This necrotic tissue extends down into the muscle. Inferiorly this extends around the anus. The sphincter muscle is exposed. His very little tone in the sphincter muscles. The necrotic tissue does not seem to extend into the peritoneum or the scrotum. We excised all the obvious necrotic tissue. We then used pulse lavage to irrigate the wound thoroughly. We placed a   flexiseal into the patient's rectum  And inflated the balloon. We will further discuss the possibility of a diverting colostomy with him tomorrow. The wound was packed with saline moistened gauze. A padded  dressing was placed over the upper sacral area to protect the at risk skin. A BD dressings were placed over the remainder of the dressing. This was secured with tape. The patient was then flipped back to a supine position. He was extubated and brought to recovery was stable condition. All sponge, instrument, and needle counts are correct.  Wilmon Arms. Corliss Skains, MD, [facility] Surgery  General/ Trauma Surgery  11/26/2015 2:38 PM

## 2015-11-08 ENCOUNTER — Inpatient Hospital Stay (HOSPITAL_COMMUNITY): Payer: Managed Care, Other (non HMO)

## 2015-11-08 LAB — CBC
HEMATOCRIT: 22.1 % — AB (ref 39.0–52.0)
Hemoglobin: 7.2 g/dL — ABNORMAL LOW (ref 13.0–17.0)
MCH: 26.7 pg (ref 26.0–34.0)
MCHC: 32.6 g/dL (ref 30.0–36.0)
MCV: 81.9 fL (ref 78.0–100.0)
Platelets: 620 10*3/uL — ABNORMAL HIGH (ref 150–400)
RBC: 2.7 MIL/uL — ABNORMAL LOW (ref 4.22–5.81)
RDW: 15.9 % — ABNORMAL HIGH (ref 11.5–15.5)
WBC: 43.4 10*3/uL — ABNORMAL HIGH (ref 4.0–10.5)

## 2015-11-08 LAB — RENAL FUNCTION PANEL
ANION GAP: 11 (ref 5–15)
Albumin: <1 g/dL — ABNORMAL LOW (ref 3.5–5.0)
BUN: 39 mg/dL — ABNORMAL HIGH (ref 6–20)
CO2: 18 mmol/L — AB (ref 22–32)
Calcium: 7.6 mg/dL — ABNORMAL LOW (ref 8.9–10.3)
Chloride: 103 mmol/L (ref 101–111)
Creatinine, Ser: 2.31 mg/dL — ABNORMAL HIGH (ref 0.61–1.24)
GFR calc Af Amer: 39 mL/min — ABNORMAL LOW (ref >60)
GFR calc non Af Amer: 34 mL/min — ABNORMAL LOW (ref >60)
GLUCOSE: 165 mg/dL — AB (ref 65–99)
POTASSIUM: 4 mmol/L (ref 3.5–5.1)
Phosphorus: 5.4 mg/dL — ABNORMAL HIGH (ref 2.5–4.6)
Sodium: 132 mmol/L — ABNORMAL LOW (ref 135–145)

## 2015-11-08 LAB — GLUCOSE, CAPILLARY
GLUCOSE-CAPILLARY: 177 mg/dL — AB (ref 65–99)
GLUCOSE-CAPILLARY: 128 mg/dL — AB (ref 65–99)
Glucose-Capillary: 144 mg/dL — ABNORMAL HIGH (ref 65–99)
Glucose-Capillary: 109 mg/dL — ABNORMAL HIGH (ref 65–99)

## 2015-11-08 LAB — ABO/RH: ABO/RH(D): B POS

## 2015-11-08 LAB — PREPARE RBC (CROSSMATCH)

## 2015-11-08 MED ORDER — HYDROMORPHONE HCL 1 MG/ML IJ SOLN
1.0000 mg | Freq: Once | INTRAMUSCULAR | Status: AC
  Administered 2015-11-08: 1 mg via INTRAVENOUS
  Filled 2015-11-08: qty 1

## 2015-11-08 MED ORDER — GI COCKTAIL ~~LOC~~
30.0000 mL | Freq: Two times a day (BID) | ORAL | Status: DC | PRN
  Administered 2015-11-13 – 2015-11-14 (×2): 30 mL via ORAL
  Filled 2015-11-08 (×3): qty 30

## 2015-11-08 MED ORDER — NITROGLYCERIN 0.4 MG SL SUBL
SUBLINGUAL_TABLET | SUBLINGUAL | Status: AC
  Filled 2015-11-08: qty 1

## 2015-11-08 MED ORDER — SODIUM CHLORIDE 0.9 % IV SOLN
Freq: Once | INTRAVENOUS | Status: AC
  Administered 2015-11-08: 18:00:00 via INTRAVENOUS

## 2015-11-08 MED ORDER — SILVER NITRATE-POT NITRATE 75-25 % EX MISC
1.0000 "application " | Freq: Once | CUTANEOUS | Status: AC
  Administered 2015-11-08: 1 via TOPICAL
  Filled 2015-11-08: qty 1

## 2015-11-08 MED ORDER — NITROGLYCERIN 0.4 MG SL SUBL
0.4000 mg | SUBLINGUAL_TABLET | SUBLINGUAL | Status: DC | PRN
  Administered 2015-11-08 – 2015-11-14 (×2): 0.4 mg via SUBLINGUAL
  Filled 2015-11-08: qty 1

## 2015-11-08 MED ORDER — COLLAGENASE 250 UNIT/GM EX OINT
TOPICAL_OINTMENT | Freq: Every day | CUTANEOUS | Status: DC
  Administered 2015-11-08 – 2015-11-13 (×6): via TOPICAL
  Filled 2015-11-08 (×2): qty 30

## 2015-11-08 NOTE — Progress Notes (Signed)
Physical Therapy Wound Evaluation/Treatment Patient Details  Name: HARSHAN KEARLEY MRN: [id number] Date of Birth: 05-Jan-1976  Today's Date: 11/08/2015 Time: 1332-1500 Time Calculation (min): 88 min  Subjective  Subjective: "is this going to hurt?" Patient and Family Stated Goals: Heal wound - get back to work Date of Onset: 10/14/15 (Approx per pt when he noticed the abscess for the first time) Prior Treatments: I&D 11/06/2015  Pain Score: Pt was premedicated. Did not appear in significant pain throughout session however asking for more pain medication when MD came in.   Wound Assessment  Wound / Incision (Open or Dehisced) 11/08/15 Incision - Open Sacrum Mid (Active)  Dressing Type ABD;Barrier Film (skin prep);Gauze (Comment);Moist to dry 11/08/2015  2:37 PM  Dressing Changed Changed 11/08/2015  2:37 PM  Dressing Status Clean;Dry;Intact 11/08/2015  2:37 PM  Dressing Change Frequency Daily 11/08/2015  2:37 PM  Site / Wound Assessment Bleeding;Yellow;Brown 11/08/2015  2:37 PM  % Wound base Red or Granulating 5% 11/08/2015  2:37 PM  % Wound base Yellow 90% 11/08/2015  2:37 PM  % Wound base Black 5% 11/08/2015  2:37 PM  Peri-wound Assessment Intact;Induration 11/08/2015  2:37 PM  Wound Length (cm) 16.5 cm 11/08/2015  2:37 PM  Wound Width (cm) 9.2 cm 11/08/2015  2:37 PM  Wound Depth (cm) 3.2 cm 11/08/2015  2:37 PM  Undermining (cm) 2.6 at 10 o'clock, 1.5 at 3 o'clock, 1.6 at 4 o'clock 11/08/2015  2:37 PM  Margins Unattached edges (unapproximated) 11/08/2015  2:37 PM  Closure None 11/08/2015  2:37 PM  Drainage Amount Minimal 11/08/2015  2:37 PM  Drainage Description Sanguineous 11/08/2015  2:37 PM  Treatment Debridement (Selective);Hydrotherapy (Pulse lavage);Packing (Saline gauze) 11/08/2015  2:37 PM   Hydrotherapy Pulsed lavage therapy - wound location: sacrum Pulsed Lavage with Suction (psi): 8 psi Pulsed Lavage with Suction - Normal Saline Used: 1000 mL Pulsed Lavage Tip: Tip with splash shield Selective  Debridement Selective Debridement - Location: sacrum Selective Debridement - Tools Used: Forceps;Scissors Selective Debridement - Tissue Removed: Yellow and brown necrotic tissue   Wound Assessment and Plan  Wound Therapy - Assess/Plan/Recommendations Wound Therapy - Clinical Statement: Pt presents to hydrotherapy s/p I&D of a gluteal abscess. Pt will benefit from continued hydrotherapy to continue selective removal of necrotic tissue and decrease bioburden.  Wound Therapy - Functional Problem List: Decreased tolerance of OOB Factors Delaying/Impairing Wound Healing: Diabetes Mellitus;Incontinence Hydrotherapy Plan: Debridement;Dressing change;Pulsatile lavage with suction;Patient/family education Wound Therapy - Frequency: 6X / week Wound Therapy - Follow Up Recommendations: Other (comment) (CIR) Wound Plan: See above  Wound Therapy Goals- Improve the function of patient's integumentary system by progressing the wound(s) through the phases of wound healing (inflammation - proliferation - remodeling) by: Decrease Necrotic Tissue to: 25% Decrease Necrotic Tissue - Progress: Goal set today Increase Granulation Tissue to: 75% Increase Granulation Tissue - Progress: Goal set today Goals/treatment plan/discharge plan were made with and agreed upon by patient/family: Yes Time For Goal Achievement: 7 days Wound Therapy - Potential for Goals: Good  Goals will be updated until maximal potential achieved or discharge criteria met.  Discharge criteria: when goals achieved, discharge from hospital, MD decision/surgical intervention, no progress towards goals, refusal/missing three consecutive treatments without notification or medical reason.  GP     Rolinda Roan 11/08/2015, 3:38 PM   Rolinda Roan, PT, DPT Acute Rehabilitation Services Pager: [phone]

## 2015-11-08 NOTE — Progress Notes (Signed)
PROGRESS NOTE    Shane Dougherty  ZOX:[id number] DOB: [birth year] DOA: 10/27/2015 PCP: Jaclyn Shaggy, MD   Outpatient Specialists: Jaclyn Shaggy, MD  Brief Narrative:  Shane Dougherty is a 40 y.o. male with medical history significant of diabetes. He complains for diarrhea x 1 month. Nothing made better or worse. No personal or family history of crohn's or UC. A few days prior to 4/16, he developed left buttock pain. He was seen in the ER where he was diagnosed with intergluteal infection with small abscess. An I/D was done that some purulent drainage. He was given norco, clindamycin and told to do warm soaks. He presented back at [facility] with worsening pain and pressure and was tx to [facility] for further I&D in operating room.  He had urinary retention and a foley was placed.  He has undergone two debridements in the OR and general surgery is recommending colostomy placement to allow for wound healing.    Assessment & Plan:   Active Problems:   Hyperglycemia due to type 2 diabetes mellitus (HCC)   Hyponatremia   Essential hypertension   Diabetic neuropathy, type II diabetes mellitus (HCC)   Peri-rectal abscess   CKD (chronic kidney disease)   Leukocytosis   AKI (acute kidney injury) (HCC)   Pressure ulcer   Abscess, gluteal, left   Precordial pain   NSTEMI (non-ST elevated myocardial infarction) (HCC)   Pericardial effusion   Stroke [facility])   Perirectal abscess   Peri-rectal abscess with 1 month of diarrhea.  C. Diff negative. - s/p I&D 4/21 and again on 5/2 - blood cultures- NGTD - culture from I&D:  group B strep - HIV negative - Continue IV Unasyn - Plan for colostomy to allow for wound healing -  Santyl ordered per WOC recommendations  NSTEMI (peak troponin of 0.54), previously seen by cardiology who have since signed off.  He had ECHO with normal EF and no RWMA.  He was started on aspirin.  Had recurrence of chest pain this evening with diaphoresis.   -  ECG  unchanged -  CXR (since intubated recently) -  Troponins -  Start nitroglycerin -  Continue BB and high dose statin -  Cardiology had previously recommended outpatient work up, but if his troponins rise again, would reconsult.  Worried that his kidney function is worsening and he is therefore still not a good candidate for catheterization.   -  Rapid response at bedside and Craige Cotta, NP going to evaluate and adjust orders prn  Possible Crohn's disease -  GI consult and recommended primary treatment of his perirectal abscess. With improvement in renal function they recommend more advanced imaging to evaluate small bowel such a CT enterography or MR enterography. We will plan on having GI evaluated patient as an outpatient.  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 - SSI - lower dose lantus due to hypoglycemia - HgbA1C 14.6 which is down from 6 months ago (16.8)  Leukocytosis, persistently elevated at 43 000 - Continue IV abx pt on Unasyn  Acute kidney injury on CKD stage 3, creatinine rising slightly -baseline Cr around 1.6-1.8 -replace foley -renal U/S- no hydronephrosis but pleural effusions - nephrology assistance appreciated  Severe protein calorie malnutrition -  Albumin 1 -  Encouraged him to eat as well as possible -  Nutrition assistance appreciated -  Continue supplements -  Diabetic diet  Profound lower extremity weakness due to 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  TSH, folate, vitamin b12, and ferritin levels elevated -  Transfuse 1 unit PRBC  DVT prophylaxis:   Lovenox Code Status:  Full Code Family Communication: none discussed with patient  directly Disposition Plan:  Possible discharge next week to SNF   Consultants:   CCS  Nephrology  Cardiology  Neurology  Procedures:   I&D 4/21  I&D 5/2   Subjective: Ongoing rectal pain, asking for more pain medication.  Denies fevers, chills, nausea, vomiting, cough, difficulty breathing (this morning).  Worried that he remains weak and wants to know what he can do to improve his weakness and his low albumin  Objective: Filed Vitals:   11/08/15 1720 11/08/15 1724 11/08/15 1800 11/08/15 1857  BP: 124/66 124/66 119/83 139/102  Pulse: 83 84 84 89  Temp:  98.2 F (36.8 C) 98 F (36.7 C) 97.7 F (36.5 C)  TempSrc:  Oral Oral Oral  Resp:  18 18 20   Height:      Weight:      SpO2:  100% 100% 100%    Intake/Output Summary (Last 24 hours) at 11/08/15 1917 Last data filed at 11/08/15 1745  Gross per 24 hour  Intake    390 ml  Output    300 ml  Net     90 ml   Filed Weights   11/01/2015 1154 10/27/2015 1800  Weight: 65.318 kg (144 lb) 67.132 kg (148 lb)    Examination:  GEN:  Adult male, NAD HEENT:  NCAT, MMM CV:  RRR, normal S1 and S2 PULM:  CTAB MSK:  Trace bilateral pitting edema, decreased bulk and tone Neuro:  No fcial droop.  Left arm 3/5, Left leg 3/5 hip flexion and barely able to wiggle ankle, otherwise, no movement.  Right arm 5/5, right hip 3/5, otherwise RLE flaccid   Data Reviewed: I have personally reviewed following labs and imaging studies  CBC:  Recent Labs Lab 10/24/2015 0252 11/03/15 0445 11/04/15 0424 11/06/15 0631 11/08/15 1001  WBC 42.6* 40.2* 34.7* 43.3* 43.4*  HGB 9.2* 8.0* 7.5* 7.4* 7.2*  HCT 27.5* 23.9* 23.6* 22.3* 22.1*  MCV 77.2* 78.1 80.3 80.5 81.9  PLT 450* 484* 464* 575* 620*   Basic Metabolic Panel:  Recent Labs Lab 11/04/15 0424 11/05/15 0728 11/06/15 0631 11-30-2015 0717 11/08/15 0600  NA 135 132* 132* 134* 132*  K 4.3 3.7 3.9 3.8 4.0  CL 108 105 103 104 103  CO2 15* 17* 18* 17* 18*  GLUCOSE 179* 226* 212*  194* 165*  BUN 37* 34* 34* 38* 39*  CREATININE 1.93* 1.84* 1.74* 2.10* 2.31*  CALCIUM 7.6* 7.6* 7.7* 7.7* 7.6*  PHOS 3.8 4.1 4.7* 4.6 5.4*   GFR: Estimated Creatinine Clearance: 39.7 mL/min (by C-G formula based on Cr of 2.31). Liver Function Tests:  Recent Labs Lab 11/04/15 0424 11/05/15 0728 11/06/15 0631 11-30-2015 0717 11/08/15 0600  ALBUMIN 1.1* 1.0* <1.0* <1.0* <1.0*   No results for input(s): LIPASE, AMYLASE in the last 168 hours. No results for input(s): AMMONIA in the last 168 hours. Coagulation Profile: No results for input(s): INR, PROTIME in the last 168 hours. Cardiac Enzymes:  Recent Labs Lab 11/01/15 1944 10/25/2015 0251 10/07/2015 0800 11/01/2015 1445 10/15/2015 2035  TROPONINI 0.13* 0.11* 0.39* 0.25* 0.15*  BNP (last 3 results) No results for input(s): PROBNP in the last 8760 hours. HbA1C: No results for input(s): HGBA1C in the last 72 hours. CBG:  Recent Labs Lab 11/15/2015 1615 11/16/2015 2019 11/08/15 0937 11/08/15 1240 11/08/15 1739  GLUCAP 174* 135* 144* 177* 128*   Lipid Profile: No results for input(s): CHOL, HDL, LDLCALC, TRIG, CHOLHDL, LDLDIRECT in the last 72 hours. Thyroid Function Tests: No results for input(s): TSH, T4TOTAL, FREET4, T3FREE, THYROIDAB in the last 72 hours. Anemia Panel: No results for input(s): VITAMINB12, FOLATE, FERRITIN, TIBC, IRON, RETICCTPCT in the last 72 hours. Urine analysis:    Component Value Date/Time   COLORURINE YELLOW 10/29/2015 1020   APPEARANCEUR TURBID* 10/29/2015 1020   LABSPEC 1.026 10/29/2015 1020   PHURINE 5.0 10/29/2015 1020   GLUCOSEU NEGATIVE 10/29/2015 1020   HGBUR MODERATE* 10/29/2015 1020   BILIRUBINUR NEGATIVE 10/29/2015 1020   KETONESUR NEGATIVE 10/29/2015 1020   PROTEINUR 100* 10/29/2015 1020   UROBILINOGEN 1.0 08/20/2014 1042   NITRITE NEGATIVE 10/29/2015 1020   LEUKOCYTESUR MODERATE* 10/29/2015 1020   Recent Results (from the past 240 hour(s))  Culture, Urine     Status: None    Collection Time: 10/29/15  7:36 PM  Result Value Ref Range Status   Specimen Description URINE, RANDOM  Final   Special Requests NONE  Final   Culture NO GROWTH 1 DAY  Final   Report Status 10/30/2015 FINAL  Final  MRSA PCR Screening     Status: None   Collection Time: 10/30/15  7:24 PM  Result Value Ref Range Status   MRSA by PCR NEGATIVE NEGATIVE Final    Comment:        The GeneXpert MRSA Assay (FDA approved for NASAL specimens only), is one component of a comprehensive MRSA colonization surveillance program. It is not intended to diagnose MRSA infection nor to guide or monitor treatment for MRSA infections.   Culture, blood (routine x 2)     Status: None   Collection Time: 11/01/15 12:20 AM  Result Value Ref Range Status   Specimen Description BLOOD RIGHT ARM  Final   Special Requests IN PEDIATRIC BOTTLE  Final   Culture NO GROWTH 5 DAYS  Final   Report Status 11/06/2015 FINAL  Final  Culture, blood (routine x 2)     Status: None   Collection Time: 11/01/15 12:46 AM  Result Value Ref Range Status   Specimen Description BLOOD LEFT HAND  Final   Special Requests BOTTLES DRAWN AEROBIC AND ANAEROBIC  Final   Culture NO GROWTH 5 DAYS  Final   Report Status 11/06/2015 FINAL  Final  Surgical pcr screen     Status: None   Collection Time: 12/05/2015  4:43 AM  Result Value Ref Range Status   MRSA, PCR NEGATIVE NEGATIVE Final   Staphylococcus aureus NEGATIVE NEGATIVE Final    Comment:        The Xpert SA Assay (FDA approved for NASAL specimens in patients over 29 years of age), is one component of a comprehensive surveillance program.  Test performance has been validated by [facility] for patients greater than or equal to 18 year old. It is not intended to diagnose infection nor to guide or monitor treatment.       Anti-infectives    Start     Dose/Rate Route Frequency Ordered Stop   11/03/15 1200  Ampicillin-Sulbactam (UNASYN) 3 g in sodium chloride 0.9 %  100 mL IVPB     3 g 100  mL/hr over 60 Minutes Intravenous Every 8 hours 11/03/15 1104     10/31/15 1300  ampicillin-sulbactam (UNASYN) 1.5 g in sodium chloride 0.9 % 50 mL IVPB  Status:  Discontinued     1.5 g 100 mL/hr over 30 Minutes Intravenous Every 12 hours 10/31/15 1214 11/03/15 1104   10/29/15 2000  metroNIDAZOLE (FLAGYL) IVPB 500 mg  Status:  Discontinued     500 mg 100 mL/hr over 60 Minutes Intravenous Every 8 hours 10/29/15 1852 10/30/15 1312   10/29/15 0800  vancomycin (VANCOCIN) IVPB 750 mg/150 ml premix  Status:  Discontinued     750 mg 150 mL/hr over 60 Minutes Intravenous Every 24 hours 10/28/15 1058 10/31/15 0750   10-28-2015 0800  vancomycin (VANCOCIN) 500 mg in sodium chloride 0.9 % 100 mL IVPB  Status:  Discontinued     500 mg 100 mL/hr over 60 Minutes Intravenous Every 12 hours 10/29/2015 1931 10/28/15 1058   2015-10-28 0200  piperacillin-tazobactam (ZOSYN) IVPB 3.375 g  Status:  Discontinued     3.375 g 12.5 mL/hr over 240 Minutes Intravenous Every 8 hours 11/01/2015 1938 10/31/15 1205   11/01/2015 2000  piperacillin-tazobactam (ZOSYN) IVPB 3.375 g     3.375 g 100 mL/hr over 30 Minutes Intravenous  Once 10/30/2015 1903 11/01/2015 2117   10/22/2015 2000  vancomycin (VANCOCIN) IVPB 1000 mg/200 mL premix     1,000 mg 200 mL/hr over 60 Minutes Intravenous  Once 11/04/2015 1903 10/11/2015 2147       Radiology Studies: No results found.      Scheduled Meds: . ampicillin-sulbactam (UNASYN) IV  3 g Intravenous Q8H  . aspirin  325 mg Oral QHS  . atorvastatin  80 mg Oral q1800  . carvedilol  6.25 mg Oral BID WC  . cholecalciferol  2,000 Units Oral Daily  . collagenase   Topical Daily  . darbepoetin (ARANESP) injection - NON-DIALYSIS  60 mcg Subcutaneous Q14 Days  . docusate sodium  100 mg Oral BID  . feeding supplement (GLUCERNA SHAKE)  237 mL Oral TID BM  . heparin subcutaneous  5,000 Units Subcutaneous Q8H  . insulin aspart  0-9 Units Subcutaneous TID WC  . insulin glargine   15 Units Subcutaneous QHS  . multivitamin with minerals  1 tablet Oral Daily  . thiamine  100 mg Oral Daily   Continuous Infusions:     LOS: 13 days    Time spent: 30 min   Renae Fickle, MD [facility] Pager [phone]  If 7PM-7AM, please contact night-coverage [email or web address] Password [email or web address] 11/08/2015, 7:17 PM

## 2015-11-08 NOTE — Progress Notes (Signed)
Patient complains of Chest pain, initially described it as stabbing pain to the right of his sternum.  When questioned again he describes pain to palpation through out his chest. 12 lead EKG done,  1 SL NTG given no change in pain, but now endorses a HA.  BP 139/102, 117/65 SR 89  RR 20  O2 sat 98%. 2 Vicodin given PO.  Patient also states he has been burping recently, hypo active BS, distended abd.  He also has generalized 2+ pitting edema.  Craige Cotta NP at bedside to assess patient.  RN to call if assistance needed

## 2015-11-08 NOTE — Progress Notes (Signed)
MD short call RN back. MD placed order for nitroglycerin SL. RN overrided in [place], given to pt. MD Short stated she will be at bedside in a few minutes. Tobin Chad 11/08/2015 7:19 PM

## 2015-11-08 NOTE — Progress Notes (Signed)
PT Cancellation Note  Patient Details Name: DAKOATA DIMITROV MRN: [id number] DOB: 09/27/1975   Cancelled Treatment:    Reason Eval/Treat Not Completed: Patient declined, no reason specified Pt declined due to pain. PT will continue to follow.    Derek Mound, PTA Pager: [phone]   11/08/2015, 4:47 PM

## 2015-11-08 NOTE — Progress Notes (Addendum)
NP paged right at shift change because pt was experiencing CP. RRRN at beside. Dr. Malachi Bonds, attending, had been called and put in orders. EKG reviewed by Dr. Malachi Bonds and appears same as old one in chart. NP went to bedside.  S: Pt states his CP started about 30-45 mins ago. Described as 9/10 when began. States he was sitting up eating dinner and pressure started. Location of pain, epigastric and lower central sternum. After NTG, no real change in pain intensity. Has HA from NTG. Mild nausea. No vomiting. No cough. Has been passing gas and had a BM today. States he was "trying to burp". A "little" SOB but not now. Hx of NSTEMI. Per RRRN, his location of pain has changed throughout assessment-epigastric, right and left chest, central sternum. Per RRRN, pain is reproducible with palpation.  O: Chronically ill appearing AAM in NAD. Alert, oriented. VS reviewed. Card: S1S2, RRR. ABD: distended, hypoactive BS, slight pain with palpation over epigastric area. No rebound or guarding. MSK: + pain with palpation of right, left and central rib cage/sternum.  A/P: 1. CP-on exam, is not consistent with cardiac pain. Improving but not with NTG. Hold off on another NTG for now and see how his CP does after GI cocktail.  2. NSTEMI-during this hospitalization. EKG without acute changes. CXR ordered. Troponins to be cycled. Cardio has signed off. On ASA, BB, statin. Had echo.  3. Epigastric discomfort ? Dyspepsia, ? chrohn's disease. See previous notes from GI, etc. Try GI cocktail. Pt for diverting colostomy in am due to perirectal abscess/wound. 4. Perirectal abscess/wound-being treated, see above.  KJKG, NP [facility] Update: Pt now without c/o CP. RN has not given GI cocktail yet. Pt is eating. KJKG, NP [facility] Update: Lab did not draw troponin until late. NP spoke with lab supervisor who stated blood can not be drawn while pt receiving a transfusion. 1st troponin .20. Pt is still CP free.  KJKG, NP [facility]

## 2015-11-08 NOTE — Progress Notes (Signed)
Patient mentating well, denying respiratory distress.  RR rate and depth WNL.    NIF: -20 cmh20.  Best of 3 attempts.  Good effort VC: 750 ml.  Best of 3 attempts.  Good effort  HR: 85 RR: 14 SpO2: 98%

## 2015-11-08 NOTE — Progress Notes (Signed)
Patient ID: Shane Dougherty, male   DOB: Sep 16, 1975, 40 y.o.   MRN: [id number]     [address] [place], [place] [address]    Phone: [phone] FAX: [phone]     Subjective: Dressing and pad saturated. S/o pain.  Has sensation to LEs, some movement. Appetite is good. cbgs are fairly good.   Objective:  Vital signs:  Filed Vitals:   11/13/2015 1617 12/02/2015 2000 11/24/2015 2000 11/08/15 0608  BP: 116/74  129/78 111/71  Pulse: 86  83 76  Temp: 98 F (36.7 C) 97.9 F (36.6 C)  97.8 F (36.6 C)  TempSrc: Oral   Oral  Resp: _0 Height:      Weight:      SpO2: 100%  100% 100%    Last BM Date: 11/19/2015  Intake/Output   Yesterday:  05/02 0701 - 05/03 0700 In: 1200 [I.V.:1200] Out: 500 [Urine:500] This shift:    I/O last 3 completed shifts: In: 7903 [P.O.:60; I.V.:1200; IV Piggyback:200] Out: 850 [Urine:850]    Physical Exam: General: Pt awake/alert/oriented x4 in no acute distress  Skin: large necrotic sacral wound, bone is palpable, but covered with fibrinous/necrotic tissue.  Rectal tube in place. Dressing changed.    Problem List:   Active Problems:   Hyperglycemia due to type 2 diabetes mellitus (HCC)   Hyponatremia   Essential hypertension   Diabetic neuropathy, type II diabetes mellitus (HCC)   Peri-rectal abscess   CKD (chronic kidney disease)   Leukocytosis   AKI (acute kidney injury) ([place])   Pressure ulcer   Abscess, gluteal, left   Precordial pain   NSTEMI (non-ST elevated myocardial infarction) ([place])   Pericardial effusion   Stroke [facility])   Perirectal abscess    Results:   Labs: Results for orders placed or performed during the hospital encounter of 10/31/2015 (from the past 48 hour(s))  Glucose, capillary     Status: Abnormal   Collection Time: 11/06/15 12:07 PM  Result Value Ref Range   Glucose-Capillary 204 (H) 65 - 99 mg/dL   Comment 1 Document in Chart    Glucose, capillary     Status: Abnormal   Collection Time: 11/06/15  5:31 PM  Result Value Ref Range   Glucose-Capillary 230 (H) 65 - 99 mg/dL  Glucose, capillary     Status: Abnormal   Collection Time: 11/06/15  9:37 PM  Result Value Ref Range   Glucose-Capillary 222 (H) 65 - 99 mg/dL  Surgical pcr screen     Status: None   Collection Time: 11/19/2015  4:43 AM  Result Value Ref Range   MRSA, PCR NEGATIVE NEGATIVE   Staphylococcus aureus NEGATIVE NEGATIVE    Comment:        The Xpert SA Assay (FDA approved for NASAL specimens in patients over 27 years of age), is one component of a comprehensive surveillance program.  Test performance has been validated by [facility] for patients greater than or equal to 58 year old. It is not intended to diagnose infection nor to guide or monitor treatment.   Renal function panel     Status: Abnormal   Collection Time: 11/22/2015  7:17 AM  Result Value Ref Range   Sodium 134 (L) 135 - 145 mmol/L   Potassium 3.8 3.5 - 5.1 mmol/L   Chloride 104 101 - 111 mmol/L   CO2 17 (L) 22 - 32 mmol/L  Glucose, Bld 194 (H) 65 - 99 mg/dL   BUN 38 (H) 6 - 20 mg/dL   Creatinine, Ser 2.10 (H) 0.61 - 1.24 mg/dL   Calcium 7.7 (L) 8.9 - 10.3 mg/dL   Phosphorus 4.6 2.5 - 4.6 mg/dL   Albumin <1.0 (L) 3.5 - 5.0 g/dL   GFR calc non Af Amer 38 (L) >60 mL/min   GFR calc Af Amer 44 (L) >60 mL/min    Comment: (NOTE) The eGFR has been calculated using the CKD EPI equation. This calculation has not been validated in all clinical situations. eGFR's persistently <60 mL/min signify possible Chronic Kidney Disease.    Anion gap 13 5 - 15  Glucose, capillary     Status: Abnormal   Collection Time: 11/25/2015  8:21 AM  Result Value Ref Range   Glucose-Capillary 187 (H) 65 - 99 mg/dL  Glucose, capillary     Status: Abnormal   Collection Time: 11/06/2015 11:42 AM  Result Value Ref Range   Glucose-Capillary 170 (H) 65 - 99 mg/dL  Glucose, capillary     Status: Abnormal    Collection Time: 11/18/2015  2:36 PM  Result Value Ref Range   Glucose-Capillary 154 (H) 65 - 99 mg/dL   Comment 1 Notify RN    Comment 2 Document in Chart   Glucose, capillary     Status: Abnormal   Collection Time: 11/30/2015  4:15 PM  Result Value Ref Range   Glucose-Capillary 174 (H) 65 - 99 mg/dL  Glucose, capillary     Status: Abnormal   Collection Time: 11/16/2015  8:19 PM  Result Value Ref Range   Glucose-Capillary 135 (H) 65 - 99 mg/dL  Renal function panel     Status: Abnormal   Collection Time: 11/08/15  6:00 AM  Result Value Ref Range   Sodium 132 (L) 135 - 145 mmol/L   Potassium 4.0 3.5 - 5.1 mmol/L   Chloride 103 101 - 111 mmol/L   CO2 18 (L) 22 - 32 mmol/L   Glucose, Bld 165 (H) 65 - 99 mg/dL   BUN 39 (H) 6 - 20 mg/dL   Creatinine, Ser 2.31 (H) 0.61 - 1.24 mg/dL   Calcium 7.6 (L) 8.9 - 10.3 mg/dL   Phosphorus 5.4 (H) 2.5 - 4.6 mg/dL   Albumin <1.0 (L) 3.5 - 5.0 g/dL   GFR calc non Af Amer 34 (L) >60 mL/min   GFR calc Af Amer 39 (L) >60 mL/min    Comment: (NOTE) The eGFR has been calculated using the CKD EPI equation. This calculation has not been validated in all clinical situations. eGFR's persistently <60 mL/min signify possible Chronic Kidney Disease.    Anion gap 11 5 - 15  Glucose, capillary     Status: Abnormal   Collection Time: 11/08/15  9:37 AM  Result Value Ref Range   Glucose-Capillary 144 (H) 65 - 99 mg/dL    Imaging / Studies: No results found.  Medications / Allergies:  Scheduled Meds: . ampicillin-sulbactam (UNASYN) IV  3 g Intravenous Q8H  . aspirin  325 mg Oral QHS  . atorvastatin  80 mg Oral q1800  . carvedilol  6.25 mg Oral BID WC  . cholecalciferol  2,000 Units Oral Daily  . darbepoetin (ARANESP) injection - NON-DIALYSIS  60 mcg Subcutaneous Q14 Days  . docusate sodium  100 mg Oral BID  . feeding supplement (GLUCERNA SHAKE)  237 mL Oral TID BM  . heparin subcutaneous  5,000 Units Subcutaneous Q8H  .  HYDROmorphone (  DILAUDID)  injection  1 mg Intravenous Once  . insulin aspart  0-9 Units Subcutaneous TID WC  . insulin glargine  15 Units Subcutaneous QHS  . multivitamin with minerals  1 tablet Oral Daily  . thiamine  100 mg Oral Daily   Continuous Infusions:  PRN Meds:.HYDROcodone-acetaminophen, ondansetron **OR** ondansetron (ZOFRAN) IV, senna-docusate  Antibiotics: Anti-infectives    Start     Dose/Rate Route Frequency Ordered Stop   11/03/15 1200  Ampicillin-Sulbactam (UNASYN) 3 g in sodium chloride 0.9 % 100 mL IVPB     3 g 100 mL/hr over 60 Minutes Intravenous Every 8 hours 11/03/15 1104     10/31/15 1300  ampicillin-sulbactam (UNASYN) 1.5 g in sodium chloride 0.9 % 50 mL IVPB  Status:  Discontinued     1.5 g 100 mL/hr over 30 Minutes Intravenous Every 12 hours 10/31/15 1214 11/03/15 1104   10/29/15 2000  metroNIDAZOLE (FLAGYL) IVPB 500 mg  Status:  Discontinued     500 mg 100 mL/hr over 60 Minutes Intravenous Every 8 hours 10/29/15 1852 10/30/15 1312   10/29/15 0800  vancomycin (VANCOCIN) IVPB 750 mg/150 ml premix  Status:  Discontinued     750 mg 150 mL/hr over 60 Minutes Intravenous Every 24 hours 10/28/15 1058 10/31/15 0750   10/24/2015 0800  vancomycin (VANCOCIN) 500 mg in sodium chloride 0.9 % 100 mL IVPB  Status:  Discontinued     500 mg 100 mL/hr over 60 Minutes Intravenous Every 12 hours 11/03/2015 1931 10/28/15 1058   11/04/2015 0200  piperacillin-tazobactam (ZOSYN) IVPB 3.375 g  Status:  Discontinued     3.375 g 12.5 mL/hr over 240 Minutes Intravenous Every 8 hours 10/13/2015 1938 10/31/15 1205   10/08/2015 2000  piperacillin-tazobactam (ZOSYN) IVPB 3.375 g     3.375 g 100 mL/hr over 30 Minutes Intravenous  Once 10/13/2015 1903 10/13/2015 2117   10/11/2015 2000  vancomycin (VANCOCIN) IVPB 1000 mg/200 mL premix     1,000 mg 200 mL/hr over 60 Minutes Intravenous  Once 11/04/2015 1903 10/13/2015 2147        Assessment/Plan Left gluteal abscess-s/p I&D 11/03/2015 Dr. Kieth Brightly POD#1 debridement of necrotic  sacral pressure ulcer and gluteal abscess -start TID wet to dry dressing changes, add PT hydrotherapy. -frequent turns, on a bed mattress -would benefit from a diverting colostomy, the patient is agreeable.  Will discuss timing with Dr. Georgette Dover -continue flexi seal ID-unasyn D#13.  ?duration  FEN-no issues   Erby Pian, [facility] Surgery Pager [phone](7A-4:30P) For consults and floor pages call [phone](7A-4:30P)  11/08/2015 10:06 AM

## 2015-11-08 NOTE — Progress Notes (Signed)
NIF -20 and VC 0.62L, Pt demonstrated with good effort and technique, but had been rolled a few minutes earlier and is slightly out of breath.

## 2015-11-08 NOTE — Progress Notes (Signed)
Patient called out stating that he has chest pain. Vital signs are as charted. Pain is sudden, sharp and heavy, rating 7 out of 10 pain. EKG is being obtained. Rapid Response called to keep pt on their radar. MD short text paged. Waiting for MD to call RN back at this time. Shane Dougherty 11/08/2015 7:00 PM

## 2015-11-08 NOTE — Progress Notes (Signed)
RN paged Night shift MD to make Night MD aware of pt's current condition. Rapid response at bedside at this time. Tobin Chad 11/08/2015 7:10 PM

## 2015-11-09 ENCOUNTER — Inpatient Hospital Stay (HOSPITAL_COMMUNITY): Payer: Managed Care, Other (non HMO)

## 2015-11-09 ENCOUNTER — Encounter (HOSPITAL_COMMUNITY): Payer: Self-pay | Admitting: Surgery

## 2015-11-09 ENCOUNTER — Inpatient Hospital Stay (HOSPITAL_COMMUNITY): Payer: Managed Care, Other (non HMO) | Admitting: Certified Registered Nurse Anesthetist

## 2015-11-09 ENCOUNTER — Encounter (HOSPITAL_COMMUNITY): Admission: EM | Disposition: E | Payer: Self-pay | Source: Home / Self Care | Attending: Family Medicine

## 2015-11-09 DIAGNOSIS — Z789 Other specified health status: Secondary | ICD-10-CM

## 2015-11-09 DIAGNOSIS — I469 Cardiac arrest, cause unspecified: Secondary | ICD-10-CM | POA: Insufficient documentation

## 2015-11-09 DIAGNOSIS — I209 Angina pectoris, unspecified: Secondary | ICD-10-CM

## 2015-11-09 DIAGNOSIS — R079 Chest pain, unspecified: Secondary | ICD-10-CM | POA: Insufficient documentation

## 2015-11-09 DIAGNOSIS — Z978 Presence of other specified devices: Secondary | ICD-10-CM | POA: Insufficient documentation

## 2015-11-09 HISTORY — PX: LAPAROSCOPIC DIVERTED COLOSTOMY: SHX5892

## 2015-11-09 LAB — POCT I-STAT 3, ART BLOOD GAS (G3+)
Acid-base deficit: 6 mmol/L — ABNORMAL HIGH (ref 0.0–2.0)
Bicarbonate: 18.2 mEq/L — ABNORMAL LOW (ref 20.0–24.0)
O2 SAT: 100 %
PCO2 ART: 30.7 mmHg — AB (ref 35.0–45.0)
PH ART: 7.376 (ref 7.350–7.450)
Patient temperature: 97
TCO2: 19 mmol/L (ref 0–100)
pO2, Arterial: 182 mmHg — ABNORMAL HIGH (ref 80.0–100.0)

## 2015-11-09 LAB — CBC WITH DIFFERENTIAL/PLATELET
BASOS PCT: 0 %
Basophils Absolute: 0 10*3/uL (ref 0.0–0.1)
EOS ABS: 0 10*3/uL (ref 0.0–0.7)
EOS PCT: 0 %
HEMATOCRIT: 22.1 % — AB (ref 39.0–52.0)
HEMOGLOBIN: 7.1 g/dL — AB (ref 13.0–17.0)
Lymphocytes Relative: 2 %
Lymphs Abs: 0.7 10*3/uL (ref 0.7–4.0)
MCH: 26.2 pg (ref 26.0–34.0)
MCHC: 32.1 g/dL (ref 30.0–36.0)
MCV: 81.5 fL (ref 78.0–100.0)
MONO ABS: 1.1 10*3/uL — AB (ref 0.1–1.0)
Monocytes Relative: 3 %
NEUTROS ABS: 33.6 10*3/uL — AB (ref 1.7–7.7)
Neutrophils Relative %: 95 %
PLATELETS: 476 10*3/uL — AB (ref 150–400)
RBC: 2.71 MIL/uL — ABNORMAL LOW (ref 4.22–5.81)
RDW: 15.7 % — ABNORMAL HIGH (ref 11.5–15.5)
WBC: 35.4 10*3/uL — ABNORMAL HIGH (ref 4.0–10.5)

## 2015-11-09 LAB — CBC
HCT: 25.1 % — ABNORMAL LOW (ref 39.0–52.0)
HEMOGLOBIN: 8.1 g/dL — AB (ref 13.0–17.0)
MCH: 26.8 pg (ref 26.0–34.0)
MCHC: 32.3 g/dL (ref 30.0–36.0)
MCV: 83.1 fL (ref 78.0–100.0)
PLATELETS: 649 10*3/uL — AB (ref 150–400)
RBC: 3.02 MIL/uL — AB (ref 4.22–5.81)
RDW: 16 % — ABNORMAL HIGH (ref 11.5–15.5)
WBC: 48.1 10*3/uL — AB (ref 4.0–10.5)

## 2015-11-09 LAB — COMPREHENSIVE METABOLIC PANEL
ALT: 7 U/L — AB (ref 17–63)
AST: 31 U/L (ref 15–41)
Albumin: <1 g/dL — ABNORMAL LOW (ref 3.5–5.0)
Alkaline Phosphatase: 839 U/L — ABNORMAL HIGH (ref 38–126)
Anion gap: 11 (ref 5–15)
BILIRUBIN TOTAL: 0.6 mg/dL (ref 0.3–1.2)
BUN: 42 mg/dL — AB (ref 6–20)
CO2: 15 mmol/L — ABNORMAL LOW (ref 22–32)
CREATININE: 2.19 mg/dL — AB (ref 0.61–1.24)
Calcium: 6.8 mg/dL — ABNORMAL LOW (ref 8.9–10.3)
Chloride: 110 mmol/L (ref 101–111)
GFR, EST AFRICAN AMERICAN: 42 mL/min — AB (ref >60)
GFR, EST NON AFRICAN AMERICAN: 36 mL/min — AB (ref >60)
Glucose, Bld: 74 mg/dL (ref 65–99)
Potassium: 3.7 mmol/L (ref 3.5–5.1)
Sodium: 136 mmol/L (ref 135–145)
TOTAL PROTEIN: 5.4 g/dL — AB (ref 6.5–8.1)

## 2015-11-09 LAB — GLUCOSE, CAPILLARY
GLUCOSE-CAPILLARY: 97 mg/dL (ref 65–99)
GLUCOSE-CAPILLARY: 83 mg/dL (ref 65–99)
GLUCOSE-CAPILLARY: 113 mg/dL — AB (ref 65–99)
GLUCOSE-CAPILLARY: 68 mg/dL (ref 65–99)
Glucose-Capillary: 87 mg/dL (ref 65–99)
Glucose-Capillary: 73 mg/dL (ref 65–99)

## 2015-11-09 LAB — PHOSPHORUS: Phosphorus: 5.7 mg/dL — ABNORMAL HIGH (ref 2.5–4.6)

## 2015-11-09 LAB — TROPONIN I
TROPONIN I: 0.24 ng/mL — AB (ref <0.031)
Troponin I: 0.2 ng/mL — ABNORMAL HIGH (ref <0.031)
Troponin I: 0.4 ng/mL — ABNORMAL HIGH (ref <0.031)
Troponin I: 0.62 ng/mL (ref <0.031)

## 2015-11-09 LAB — LACTIC ACID, PLASMA
LACTIC ACID, VENOUS: 0.9 mmol/L (ref 0.5–2.0)
LACTIC ACID, VENOUS: 0.8 mmol/L (ref 0.5–2.0)

## 2015-11-09 LAB — RENAL FUNCTION PANEL
ANION GAP: 13 (ref 5–15)
Albumin: <1 g/dL — ABNORMAL LOW (ref 3.5–5.0)
BUN: 44 mg/dL — AB (ref 6–20)
CALCIUM: 7.7 mg/dL — AB (ref 8.9–10.3)
CO2: 17 mmol/L — AB (ref 22–32)
Chloride: 102 mmol/L (ref 101–111)
Creatinine, Ser: 2.42 mg/dL — ABNORMAL HIGH (ref 0.61–1.24)
GFR calc Af Amer: 37 mL/min — ABNORMAL LOW (ref >60)
GFR calc non Af Amer: 32 mL/min — ABNORMAL LOW (ref >60)
GLUCOSE: 103 mg/dL — AB (ref 65–99)
POTASSIUM: 4.1 mmol/L (ref 3.5–5.1)
Phosphorus: 5.7 mg/dL — ABNORMAL HIGH (ref 2.5–4.6)
SODIUM: 132 mmol/L — AB (ref 135–145)

## 2015-11-09 LAB — MAGNESIUM: MAGNESIUM: 1.8 mg/dL (ref 1.7–2.4)

## 2015-11-09 LAB — ECHOCARDIOGRAM COMPLETE
Height: 67 in
Weight: 2368 oz

## 2015-11-09 LAB — PROCALCITONIN: PROCALCITONIN: 3.15 ng/mL

## 2015-11-09 LAB — PROTIME-INR
INR: 1.27 (ref 0.00–1.49)
Prothrombin Time: 16 seconds — ABNORMAL HIGH (ref 11.6–15.2)

## 2015-11-09 LAB — PREPARE RBC (CROSSMATCH)

## 2015-11-09 SURGERY — CREATION, COLOSTOMY, DIVERTING, LAPAROSCOPIC
Anesthesia: General | Site: Abdomen

## 2015-11-09 MED ORDER — PHENYLEPHRINE HCL 10 MG/ML IJ SOLN
10.0000 mg | INTRAMUSCULAR | Status: DC | PRN
  Administered 2015-11-09: 15 ug/min via INTRAVENOUS

## 2015-11-09 MED ORDER — ONDANSETRON HCL 4 MG/2ML IJ SOLN
INTRAMUSCULAR | Status: AC
  Filled 2015-11-09: qty 2

## 2015-11-09 MED ORDER — PROCHLORPERAZINE EDISYLATE 5 MG/ML IJ SOLN
10.0000 mg | Freq: Once | INTRAMUSCULAR | Status: DC | PRN
Start: 2015-11-09 — End: 2015-11-10
  Filled 2015-11-09: qty 2

## 2015-11-09 MED ORDER — SODIUM CHLORIDE 0.9 % IV SOLN: Freq: Once | INTRAVENOUS | Status: DC

## 2015-11-09 MED ORDER — BUPIVACAINE-EPINEPHRINE 0.25% -1:200000 IJ SOLN
INTRAMUSCULAR | Status: DC | PRN
  Administered 2015-11-09: 6 mL

## 2015-11-09 MED ORDER — BUPIVACAINE-EPINEPHRINE (PF) 0.25% -1:200000 IJ SOLN
INTRAMUSCULAR | Status: AC
  Filled 2015-11-09: qty 30

## 2015-11-09 MED ORDER — PROPOFOL 10 MG/ML IV BOLUS
INTRAVENOUS | Status: DC | PRN
  Administered 2015-11-09 (×2): 25 mg via INTRAVENOUS
  Administered 2015-11-09: 100 mg via INTRAVENOUS

## 2015-11-09 MED ORDER — ATROPINE SULFATE 0.4 MG/ML IV SOSY
PREFILLED_SYRINGE | INTRAVENOUS | Status: AC
  Filled 2015-11-09: qty 2.5

## 2015-11-09 MED ORDER — FENTANYL CITRATE (PF) 100 MCG/2ML IJ SOLN
25.0000 ug | INTRAMUSCULAR | Status: DC | PRN
  Administered 2015-11-09 – 2015-11-11 (×6): 50 ug via INTRAVENOUS
  Filled 2015-11-09 (×6): qty 2

## 2015-11-09 MED ORDER — PHENYLEPHRINE HCL 10 MG/ML IJ SOLN
INTRAMUSCULAR | Status: AC
  Filled 2015-11-09: qty 2

## 2015-11-09 MED ORDER — DEXTROSE 50 % IV SOLN
INTRAVENOUS | Status: AC
  Filled 2015-11-09: qty 50

## 2015-11-09 MED ORDER — EPINEPHRINE HCL 0.1 MG/ML IJ SOSY
PREFILLED_SYRINGE | INTRAMUSCULAR | Status: AC
  Filled 2015-11-09: qty 10

## 2015-11-09 MED ORDER — SODIUM CHLORIDE 0.9 % IV SOLN
INTRAVENOUS | Status: DC
  Administered 2015-11-09 – 2015-11-13 (×2): via INTRAVENOUS

## 2015-11-09 MED ORDER — ANTISEPTIC ORAL RINSE SOLUTION (CORINZ)
7.0000 mL | Freq: Four times a day (QID) | OROMUCOSAL | Status: DC
  Administered 2015-11-09 – 2015-11-10 (×2): 7 mL via OROMUCOSAL

## 2015-11-09 MED ORDER — METHOCARBAMOL 500 MG PO TABS
500.0000 mg | ORAL_TABLET | Freq: Four times a day (QID) | ORAL | Status: DC | PRN
  Administered 2015-11-10: 500 mg via ORAL
  Filled 2015-11-09 (×3): qty 1

## 2015-11-09 MED ORDER — SUGAMMADEX SODIUM 200 MG/2ML IV SOLN
INTRAVENOUS | Status: DC | PRN
  Administered 2015-11-09: 150 mg via INTRAVENOUS

## 2015-11-09 MED ORDER — GLYCOPYRROLATE 0.2 MG/ML IJ SOLN
INTRAMUSCULAR | Status: DC | PRN
  Administered 2015-11-09: 0.2 mg via INTRAVENOUS

## 2015-11-09 MED ORDER — DEXTROSE-NACL 5-0.9 % IV SOLN
INTRAVENOUS | Status: DC
  Administered 2015-11-09 – 2015-11-10 (×2): via INTRAVENOUS

## 2015-11-09 MED ORDER — CHLORHEXIDINE GLUCONATE 0.12 % MT SOLN
15.0000 mL | Freq: Two times a day (BID) | OROMUCOSAL | Status: DC
  Administered 2015-11-09 (×2): 15 mL via OROMUCOSAL

## 2015-11-09 MED ORDER — ATROPINE SULFATE 0.4 MG/ML IJ SOLN
INTRAMUSCULAR | Status: DC | PRN
  Administered 2015-11-09 (×2): 0.4 mg via INTRAVENOUS

## 2015-11-09 MED ORDER — INSULIN ASPART 100 UNIT/ML ~~LOC~~ SOLN
0.0000 [IU] | SUBCUTANEOUS | Status: DC
  Administered 2015-11-11: 2 [IU] via SUBCUTANEOUS
  Administered 2015-11-11: 3 [IU] via SUBCUTANEOUS
  Administered 2015-11-11: 5 [IU] via SUBCUTANEOUS
  Administered 2015-11-11: 3 [IU] via SUBCUTANEOUS
  Administered 2015-11-11: 2 [IU] via SUBCUTANEOUS
  Administered 2015-11-12: 5 [IU] via SUBCUTANEOUS
  Administered 2015-11-12: 3 [IU] via SUBCUTANEOUS
  Administered 2015-11-12: 5 [IU] via SUBCUTANEOUS
  Administered 2015-11-12 – 2015-11-13 (×3): 3 [IU] via SUBCUTANEOUS
  Administered 2015-11-13: 5 [IU] via SUBCUTANEOUS
  Administered 2015-11-13: 3 [IU] via SUBCUTANEOUS

## 2015-11-09 MED ORDER — MIDAZOLAM HCL 5 MG/5ML IJ SOLN
INTRAMUSCULAR | Status: DC | PRN
  Administered 2015-11-09 (×2): 1 mg via INTRAVENOUS

## 2015-11-09 MED ORDER — DEXAMETHASONE SODIUM PHOSPHATE 10 MG/ML IJ SOLN
INTRAMUSCULAR | Status: AC
  Filled 2015-11-09: qty 1

## 2015-11-09 MED ORDER — PROPOFOL 1000 MG/100ML IV EMUL
INTRAVENOUS | Status: AC
  Filled 2015-11-09: qty 100

## 2015-11-09 MED ORDER — PERFLUTREN LIPID MICROSPHERE
2.0000 mL | INTRAVENOUS | Status: AC | PRN
  Administered 2015-11-09: 2 mL via INTRAVENOUS
  Filled 2015-11-09: qty 10

## 2015-11-09 MED ORDER — LIDOCAINE 2% (20 MG/ML) 5 ML SYRINGE
INTRAMUSCULAR | Status: AC
  Filled 2015-11-09: qty 5

## 2015-11-09 MED ORDER — FENTANYL CITRATE (PF) 100 MCG/2ML IJ SOLN
INTRAMUSCULAR | Status: DC | PRN
  Administered 2015-11-09: 50 ug via INTRAVENOUS
  Administered 2015-11-09: 100 ug via INTRAVENOUS

## 2015-11-09 MED ORDER — HYDROMORPHONE HCL 1 MG/ML IJ SOLN: 0.2500 mg | INTRAMUSCULAR | Status: DC | PRN

## 2015-11-09 MED ORDER — ROCURONIUM BROMIDE 100 MG/10ML IV SOLN
INTRAVENOUS | Status: DC | PRN
Start: 2015-11-09 — End: 2015-11-09
  Administered 2015-11-09: 40 mg via INTRAVENOUS

## 2015-11-09 MED ORDER — ONDANSETRON HCL 4 MG/2ML IJ SOLN
INTRAMUSCULAR | Status: DC | PRN
  Administered 2015-11-09: 4 mg via INTRAVENOUS

## 2015-11-09 MED ORDER — ROCURONIUM BROMIDE 50 MG/5ML IV SOLN
INTRAVENOUS | Status: AC
  Filled 2015-11-09: qty 2

## 2015-11-09 MED ORDER — FENTANYL CITRATE (PF) 250 MCG/5ML IJ SOLN
INTRAMUSCULAR | Status: AC
  Filled 2015-11-09: qty 5

## 2015-11-09 MED ORDER — SODIUM CHLORIDE 0.9 % IV SOLN
INTRAVENOUS | Status: DC
  Administered 2015-11-09 (×4): via INTRAVENOUS

## 2015-11-09 MED ORDER — DEXTROSE 50 % IV SOLN
25.0000 mL | Freq: Once | INTRAVENOUS | Status: AC
  Administered 2015-11-09: 25 mL via INTRAVENOUS

## 2015-11-09 MED ORDER — PROPOFOL 10 MG/ML IV BOLUS
INTRAVENOUS | Status: AC
  Filled 2015-11-09: qty 20

## 2015-11-09 MED ORDER — SUCCINYLCHOLINE CHLORIDE 200 MG/10ML IV SOSY
PREFILLED_SYRINGE | INTRAVENOUS | Status: AC
  Filled 2015-11-09: qty 10

## 2015-11-09 MED ORDER — 0.9 % SODIUM CHLORIDE (POUR BTL) OPTIME
TOPICAL | Status: DC | PRN
  Administered 2015-11-09 (×2): 1000 mL

## 2015-11-09 MED ORDER — EPINEPHRINE HCL 0.1 MG/ML IJ SOSY
PREFILLED_SYRINGE | INTRAMUSCULAR | Status: DC | PRN
  Administered 2015-11-09: 1 mL via INTRAVENOUS
  Administered 2015-11-09: 8 mL via INTRAVENOUS
  Administered 2015-11-09: 1 mL via INTRAVENOUS

## 2015-11-09 MED ORDER — EPHEDRINE SULFATE 50 MG/ML IJ SOLN
INTRAMUSCULAR | Status: DC | PRN
  Administered 2015-11-09: 10 mg via INTRAVENOUS
  Administered 2015-11-09: 20 mg via INTRAVENOUS
  Administered 2015-11-09: 5 mg via INTRAVENOUS
  Administered 2015-11-09: 15 mg via INTRAVENOUS
  Administered 2015-11-09: 30 mg via INTRAVENOUS

## 2015-11-09 MED ORDER — PROPOFOL 1000 MG/100ML IV EMUL
5.0000 ug/kg/min | INTRAVENOUS | Status: DC
  Administered 2015-11-09 (×2): 30 ug/kg/min via INTRAVENOUS
  Administered 2015-11-10: 25 ug/kg/min via INTRAVENOUS
  Administered 2015-11-10: 50 ug/kg/min via INTRAVENOUS
  Filled 2015-11-09 (×4): qty 100

## 2015-11-09 MED ORDER — LIDOCAINE HCL (CARDIAC) 20 MG/ML IV SOLN
INTRAVENOUS | Status: DC | PRN
  Administered 2015-11-09: 80 mg via INTRAVENOUS

## 2015-11-09 MED ORDER — PHENYLEPHRINE HCL 10 MG/ML IJ SOLN
INTRAMUSCULAR | Status: DC | PRN
  Administered 2015-11-09: 80 ug via INTRAVENOUS

## 2015-11-09 MED ORDER — MIDAZOLAM HCL 2 MG/2ML IJ SOLN
INTRAMUSCULAR | Status: AC
  Filled 2015-11-09: qty 2

## 2015-11-09 MED ORDER — PANTOPRAZOLE SODIUM 40 MG IV SOLR
40.0000 mg | INTRAVENOUS | Status: DC
  Administered 2015-11-09 – 2015-11-12 (×4): 40 mg via INTRAVENOUS
  Filled 2015-11-09 (×4): qty 40

## 2015-11-09 MED ORDER — SUGAMMADEX SODIUM 200 MG/2ML IV SOLN
INTRAVENOUS | Status: AC
  Filled 2015-11-09: qty 2

## 2015-11-09 SURGICAL SUPPLY — 72 items
APPLIER CLIP 5 13 M/L LIGAMAX5 (MISCELLANEOUS)
APPLIER CLIP ROT 10 11.4 M/L (STAPLE)
BLADE SURG ROTATE 9660 (MISCELLANEOUS) ×3 IMPLANT
CANISTER SUCTION 2500CC (MISCELLANEOUS) ×3 IMPLANT
CATH ROBINSON RED A/P 14FR (CATHETERS) ×3 IMPLANT
CELLS DAT CNTRL 66122 CELL SVR (MISCELLANEOUS) IMPLANT
CHLORAPREP W/TINT 26ML (MISCELLANEOUS) ×3 IMPLANT
CLIP APPLIE 5 13 M/L LIGAMAX5 (MISCELLANEOUS) IMPLANT
CLIP APPLIE ROT 10 11.4 M/L (STAPLE) IMPLANT
COVER MAYO STAND STRL (DRAPES) ×3 IMPLANT
COVER SURGICAL LIGHT HANDLE (MISCELLANEOUS) ×6 IMPLANT
DRAPE PROXIMA HALF (DRAPES) IMPLANT
DRAPE UTILITY XL STRL (DRAPES) ×3 IMPLANT
DRSG OPSITE POSTOP 4X10 (GAUZE/BANDAGES/DRESSINGS) ×3 IMPLANT
DRSG OPSITE POSTOP 4X8 (GAUZE/BANDAGES/DRESSINGS) IMPLANT
ELECT BLADE 6.5 EXT (BLADE) ×3 IMPLANT
ELECT CAUTERY BLADE 6.4 (BLADE) ×6 IMPLANT
ELECT REM PT RETURN 9FT ADLT (ELECTROSURGICAL) ×3
ELECTRODE REM PT RTRN 9FT ADLT (ELECTROSURGICAL) ×1 IMPLANT
GAUZE SPONGE 2X2 8PLY STRL LF (GAUZE/BANDAGES/DRESSINGS) ×1 IMPLANT
GEL ULTRASOUND 20GR AQUASONIC (MISCELLANEOUS) IMPLANT
GLOVE BIO SURGEON STRL SZ 6.5 (GLOVE) ×2 IMPLANT
GLOVE BIO SURGEON STRL SZ7 (GLOVE) ×6 IMPLANT
GLOVE BIO SURGEONS STRL SZ 6.5 (GLOVE) ×1
GLOVE BIOGEL PI IND STRL 7.0 (GLOVE) ×3 IMPLANT
GLOVE BIOGEL PI IND STRL 7.5 (GLOVE) ×2 IMPLANT
GLOVE BIOGEL PI INDICATOR 7.0 (GLOVE) ×6
GLOVE BIOGEL PI INDICATOR 7.5 (GLOVE) ×4
GOWN STRL REUS W/ TWL LRG LVL3 (GOWN DISPOSABLE) ×7 IMPLANT
GOWN STRL REUS W/TWL LRG LVL3 (GOWN DISPOSABLE) ×14
KIT BASIN OR (CUSTOM PROCEDURE TRAY) ×3 IMPLANT
KIT OSTOMY DRAINABLE 2.75 STR (WOUND CARE) ×3 IMPLANT
KIT ROOM TURNOVER OR (KITS) ×3 IMPLANT
LEGGING LITHOTOMY PAIR STRL (DRAPES) IMPLANT
LIGASURE 5MM LAPAROSCOPIC (INSTRUMENTS) IMPLANT
LIGASURE IMPACT 36 18CM CVD LR (INSTRUMENTS) IMPLANT
NS IRRIG 1000ML POUR BTL (IV SOLUTION) ×6 IMPLANT
PAD ARMBOARD 7.5X6 YLW CONV (MISCELLANEOUS) ×6 IMPLANT
PENCIL BUTTON HOLSTER BLD 10FT (ELECTRODE) ×3 IMPLANT
RTRCTR WOUND ALEXIS 18CM MED (MISCELLANEOUS)
SCALPEL HARMONIC ACE (MISCELLANEOUS) ×3 IMPLANT
SCISSORS LAP 5X35 DISP (ENDOMECHANICALS) ×3 IMPLANT
SET IRRIG TUBING LAPAROSCOPIC (IRRIGATION / IRRIGATOR) IMPLANT
SLEEVE ENDOPATH XCEL 5M (ENDOMECHANICALS) ×6 IMPLANT
SPECIMEN JAR LARGE (MISCELLANEOUS) ×3 IMPLANT
SPONGE GAUZE 2X2 STER 10/PKG (GAUZE/BANDAGES/DRESSINGS) ×2
STAPLER VISISTAT 35W (STAPLE) ×3 IMPLANT
SUCTION POOLE TIP (SUCTIONS) ×3 IMPLANT
SURGILUBE 2OZ TUBE FLIPTOP (MISCELLANEOUS) IMPLANT
SUT ETHILON 2 0 FS 18 (SUTURE) ×3 IMPLANT
SUT PROLENE 2 0 CT2 30 (SUTURE) IMPLANT
SUT PROLENE 2 0 KS (SUTURE) IMPLANT
SUT SILK 2 0 SH CR/8 (SUTURE) ×3 IMPLANT
SUT SILK 2 0 TIES 10X30 (SUTURE) ×3 IMPLANT
SUT SILK 3 0 SH CR/8 (SUTURE) ×3 IMPLANT
SUT SILK 3 0 TIES 10X30 (SUTURE) ×3 IMPLANT
SUT VIC AB 3-0 SH 18 (SUTURE) ×3 IMPLANT
SYR BULB IRRIGATION 50ML (SYRINGE) ×3 IMPLANT
SYS LAPSCP GELPORT 120MM (MISCELLANEOUS)
SYSTEM LAPSCP GELPORT 120MM (MISCELLANEOUS) IMPLANT
TOWEL OR 17X26 10 PK STRL BLUE (TOWEL DISPOSABLE) ×6 IMPLANT
TRAY FOLEY CATH 16FRSI W/METER (SET/KITS/TRAYS/PACK) ×3 IMPLANT
TRAY LAPAROSCOPIC MC (CUSTOM PROCEDURE TRAY) ×3 IMPLANT
TRAY PROCTOSCOPIC FIBER OPTIC (SET/KITS/TRAYS/PACK) IMPLANT
TROCAR XCEL 12X100 BLDLESS (ENDOMECHANICALS) ×3 IMPLANT
TROCAR XCEL BLUNT TIP 100MML (ENDOMECHANICALS) IMPLANT
TROCAR XCEL NON-BLD 11X100MML (ENDOMECHANICALS) IMPLANT
TROCAR XCEL NON-BLD 5MMX100MML (ENDOMECHANICALS) ×3 IMPLANT
TUBE CONNECTING 12'X1/4 (SUCTIONS) ×1
TUBE CONNECTING 12X1/4 (SUCTIONS) ×2 IMPLANT
TUBING FILTER THERMOFLATOR (ELECTROSURGICAL) ×3 IMPLANT
YANKAUER SUCT BULB TIP NO VENT (SUCTIONS) ×3 IMPLANT

## 2015-11-09 NOTE — Op Note (Signed)
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 Asst.: Dr. Madelin Rear Anesthesia: Gen. endotracheal Indications: This is a 40 year old male who initially presented with a left gluteal abscess. He underwent incision and drainage of this area on 10/15/2015. He had a lot of pre-existing medical conditions. 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 He has also developed necrosis of the skin and soft tissue near his gluteal abscess. This was debrided 2 days ago. The patient has had chronic diarrhea and has frequent contamination of his wound. After the debridement 2 days ago there is question whether his sphincter muscle is functioning. We discussed diverting colostomy in the patient has consented to a loop colostomy.  Description of procedure: The patient brought to the operating room and placed in a supine position on the operating room table. After an adequate level of general anesthesia was obtained, his abdomen was prepped with ChloraPrep and draped sterile fashion. A timeout was taken to ensure the proper patient and proper procedure. A 5 mm incision was made in the right upper quadrant. A 5 mm Optiview trocar was used to cannulate the peritoneal cavity. We insufflated CO2 maintaining a maximum pressure of 15 mm of mercury. The laparoscope was inserted. The patient has a lot of ascites. A redundant loop of sigmoid colon is seen in the left lower quadrant. We placed a 5 mm port in the left lower quadrant. We were able to grasp this redundant loop. It does extend up to the abdominal wall. We decided use this point for our loop colostomy. We made a transverse incision in the left lower quadrant  just below the level of the umbilicus. We dissected down to the fascia which was incised in a cruciate fashion. A bleeding vessel was ligated with a 0 Vicryl suture. We were able to bring the redundant loop of sigmoid colon out through the fascial incision. The laparoscope was then used to confirm that there was no further bleeding or kink in sigmoid colon. We created a small defect in the mesentery of the sigmoid colon. A 16 French red rubber catheter was passed through this opening. It was sutured to the skin and trimmed tacked as the loop colostomy bar. We then began maturing the colostomy by opening it with cautery and sewing the edges to the skin with 3-0 Vicryl sutures. During this time the patient became hypotensive and bradycardic. Was treated by anesthesia and was found to be pulseless. CPR was initiated. We completed the maturation of the colostomy and placed an ostomy appliance. The 2 port sites were closed with staples. A pulse and blood pressure were regained. The patient is being transported to the intensive care unit.  Wilmon Arms. Corliss Skains, MD, [facility]  General/ Trauma Surgery  11/28/2015 1:24 PM

## 2015-11-09 NOTE — Progress Notes (Signed)
Physical Therapy Wound Treatment Patient Details  Name: JATERRIUS RICKETSON MRN: [id number] Date of Birth: 07-13-1975  Today's Date: 11/21/2015 Time: 0910-1002 Time Calculation (min): 52 min  Subjective  Subjective: "is this going to hurt?" Patient and Family Stated Goals: Heal wound - get back to work Date of Onset: 10/14/15 (Approx per pt when he noticed the abscess for the first time) Prior Treatments: I&D 11/28/2015  Pain Score:  Pt premedicated. Reports moderate pain during treatment.   Wound Assessment  Wound / Incision (Open or Dehisced) 11/08/15 Incision - Open Sacrum Mid (Active)  Dressing Type ABD;Barrier Film (skin prep);Gauze (Comment);Moist to dry 11/06/2015  1:43 PM  Dressing Changed Changed 11/28/2015  1:43 PM  Dressing Status Clean;Dry;Intact 11/19/2015  1:43 PM  Dressing Change Frequency Daily 11/06/2015  1:43 PM  Site / Wound Assessment Bleeding;Yellow;Brown;Red 11/17/2015  1:43 PM  % Wound base Red or Granulating 10% 11/27/2015  1:43 PM  % Wound base Yellow 80% 11/12/2015  1:43 PM  % Wound base Black 10% 11/30/2015  1:43 PM  Peri-wound Assessment Intact;Induration 11/28/2015  1:43 PM  Wound Length (cm) 16.5 cm 11/08/2015  2:37 PM  Wound Width (cm) 9.2 cm 11/08/2015  2:37 PM  Wound Depth (cm) 3.2 cm 11/08/2015  2:37 PM  Undermining (cm) 2.6 at 10 o'clock, 1.5 at 3 o'clock, 1.6 at 4 o'clock 11/08/2015  2:37 PM  Margins Unattached edges (unapproximated) 11/18/2015  1:43 PM  Closure None 11/17/2015  1:43 PM  Drainage Amount Minimal 11/18/2015  1:43 PM  Drainage Description Sanguineous 11/27/2015  1:43 PM  Treatment Debridement (Selective);Hydrotherapy (Pulse lavage);Packing (Saline gauze) 11/11/2015  1:43 PM  Santyl applied to wound bed prior to applying dressing.   Hydrotherapy Pulsed lavage therapy - wound location: sacrum Pulsed Lavage with Suction (psi): 12 psi (8 at times due to pain) Pulsed Lavage with Suction - Normal Saline Used: 1000 mL Pulsed Lavage Tip: Tip with splash shield Selective  Debridement Selective Debridement - Location: sacrum Selective Debridement - Tools Used: Forceps;Scissors Selective Debridement - Tissue Removed: Yellow and brown necrotic tissue   Wound Assessment and Plan  Wound Therapy - Assess/Plan/Recommendations Wound Therapy - Clinical Statement: Pt will benefit from continued hydrotherapy to continue selective removal of necrotic tissue and decrease bioburden.  Wound Therapy - Functional Problem List: Decreased tolerance of OOB Factors Delaying/Impairing Wound Healing: Diabetes Mellitus;Incontinence Hydrotherapy Plan: Debridement;Dressing change;Pulsatile lavage with suction;Patient/family education Wound Therapy - Frequency: 6X / week Wound Therapy - Follow Up Recommendations: Other (comment) (CIR) Wound Plan: See above  Wound Therapy Goals- Improve the function of patient's integumentary system by progressing the wound(s) through the phases of wound healing (inflammation - proliferation - remodeling) by: Decrease Necrotic Tissue to: 25% Decrease Necrotic Tissue - Progress: Progressing toward goal Increase Granulation Tissue to: 75% Increase Granulation Tissue - Progress: Progressing toward goal Goals/treatment plan/discharge plan were made with and agreed upon by patient/family: Yes Time For Goal Achievement: 7 days Wound Therapy - Potential for Goals: Good  Goals will be updated until maximal potential achieved or discharge criteria met.  Discharge criteria: when goals achieved, discharge from hospital, MD decision/surgical intervention, no progress towards goals, refusal/missing three consecutive treatments without notification or medical reason.  GP     Rolinda Roan 12/06/2015, 1:48 PM  Rolinda Roan, PT, DPT Acute Rehabilitation Services Pager: [phone]

## 2015-11-09 NOTE — Anesthesia Procedure Notes (Signed)
Procedure Name: Intubation Date/Time: 12/02/2015 12:03 PM Performed by: Fabian November Pre-anesthesia Checklist: Patient identified, Patient being monitored, Timeout performed, Emergency Drugs available and Suction available Patient Re-evaluated:Patient Re-evaluated prior to inductionOxygen Delivery Method: Circle System Utilized Preoxygenation: Pre-oxygenation with 100% oxygen Intubation Type: IV induction Ventilation: Mask ventilation without difficulty Laryngoscope Size: [place] and 3 Grade View: Grade I Tube type: Oral Tube size: 8.0 mm Number of attempts: 1 Airway Equipment and Method: Stylet Placement Confirmation: ETT inserted through vocal cords under direct vision,  positive ETCO2 and breath sounds checked- equal and bilateral Secured at: 22 cm Tube secured with: Tape Dental Injury: Teeth and Oropharynx as per pre-operative assessment

## 2015-11-09 NOTE — Progress Notes (Signed)
eLink Physician-Brief Progress Note Patient Name: Shane Dougherty DOB: 04/25/1976 MRN: [id number]   Date of Service  11/11/2015  HPI/Events of Note  Patient on mechanical ventilation. Needs stress ulcer prophylaxis.   eICU Interventions  Will order Protonix IV.      Intervention Category Intermediate Interventions: Best-practice therapies (e.g. DVT, beta blocker, etc.)  [name],Shane Dougherty 11/20/2015, 9:53 PM

## 2015-11-09 NOTE — Progress Notes (Signed)
  Echocardiogram 2D Echocardiogram has been performed.  Shane Dougherty 2015/11/19, 5:26 PM

## 2015-11-09 NOTE — Progress Notes (Addendum)
CSW continue to follow pt for eventual transfer to SNF-CSW spoke with Allene Dillon (pts brother) who is helping pt determine which SNF to go to- they have not made a decision at this time  Merlyn Lot, [facility] Clinical Social Worker [phone]

## 2015-11-09 NOTE — Transfer of Care (Addendum)
Immediate Anesthesia Transfer of Care Note  Patient: Shane Dougherty  Procedure(s) Performed: Procedure(s): LAPAROSCOPIC DIVERTED COLOSTOMY (N/A)  Patient Location: SICU  Anesthesia Type:General  Level of Consciousness: responds to stimulation and Patient remains intubated per anesthesia plan  Airway & Oxygen Therapy: Patient remains intubated per anesthesia plan and Patient placed on Ventilator (see vital sign flow sheet for setting)  Post-op Assessment: Report given to RN and Post -op Vital signs reviewed and stable  Post vital signs: Reviewed and stable  Last Vitals:  Filed Vitals:   11/08/15 2120 11/23/2015 0518  BP:  115/66  Pulse:  82  Temp: 36.5 C 36.6 C  Resp:  14    Last Pain:  Filed Vitals:   11/24/2015 0809  PainSc: 7       Patients Stated Pain Goal: 0 (11/18/2015 1004)  Complications: See intraop record. Pt developed PEA --> ALCS x22min with ROSC --> SICU

## 2015-11-09 NOTE — Progress Notes (Addendum)
Patient Name: Shane Dougherty Date of Encounter: 11/24/2015  Active Problems:   Hyperglycemia due to type 2 diabetes mellitus (HCC)   Hyponatremia   Essential hypertension   Diabetic neuropathy, type II diabetes mellitus (HCC)   Peri-rectal abscess   CKD (chronic kidney disease)   Leukocytosis   AKI (acute kidney injury) (HCC)   Pressure ulcer   Abscess, gluteal, left   Precordial pain   NSTEMI (non-ST elevated myocardial infarction) (HCC)   Pericardial effusion   Stroke [facility])   Perirectal abscess   Primary Cardiologist:New Patient Profile:Shane Dougherty is a 40 y.o. male with medical history significant of diabetes. He complained of diarrhea x 1 month prior to admission. A few days prior ,on 4/16, he developed left buttock pain. He was seen in the ER where he was diagnosed with intergluteal infection with small abscess. An I/D was done that some purulent drainage. He was given norco, clindamycin and told to do warm soaks. He presented back at [facility] with worsening pain and pressure and was tx to [facility] for further I&D in operating room. He has undergone two debridements in the OR on 4/21 and 5/2.  He went for lap colostomy placement and had PEA arrest, with ROSC in 2 min.   SUBJECTIVE: Intubated, sedated in ICU.     OBJECTIVE Filed Vitals:   11/25/2015 1405 11/08/2015 1415 11/08/2015 1430 11/13/2015 1445  BP: 123/75     Pulse: 91 92 92 90  Temp:      TempSrc:      Resp: Height:      Weight:      SpO2: 100% 100% 100% 100%    Intake/Output Summary (Last 24 hours) at 12/03/2015 1451 Last data filed at 11/16/2015 1424  Gross per 24 hour  Intake   1885 ml  Output    450 ml  Net   1435 ml   Filed Weights   10/08/2015 1154 10/24/2015 1800  Weight: 144 lb (65.318 kg) 148 lb (67.132 kg)    PHYSICAL EXAM General: Well developed, well nourished, male in no acute distress. Head: Normocephalic, atraumatic.  Neck: Supple without bruits, JVD. Lungs:   Resp regular and unlabored, CTA. Heart: RRR, S1, S2, no S3, S4, or murmur; no rub. Abdomen: Soft, non-tender, non-distended, BS + x 4.  Extremities: No clubbing, cyanosis, edema.  Neuro: Alert and oriented X 3. Moves all extremities spontaneously. Psych: Normal affect.  LABS: CBC: Recent Labs  11/08/15 1001 11/08/15 2348  WBC 43.4* 48.1*  HGB 7.2* 8.1*  HCT 22.1* 25.1*  MCV 81.9 83.1  PLT 620* 649*   Basic Metabolic Panel: Recent Labs  11/08/15 0600 11/11/2015 0652  NA 132* 132*  K 4.0 4.1  CL 103 102  CO2 18* 17*  GLUCOSE 165* 103*  BUN 39* 44*  CREATININE 2.31* 2.42*  CALCIUM 7.6* 7.7*  PHOS 5.4* 5.7*   Liver Function Tests: Recent Labs  11/08/15 0600 11/20/2015 0652  ALBUMIN <1.0* <1.0*   Cardiac Enzymes: Recent Labs  11/08/15 2348 11/12/2015 0652  TROPONINI 0.20* 0.40*     Current facility-administered medications:  .  0.9 %  sodium chloride infusion, , Intravenous, Continuous, Renae Fickle, MD .  0.9 %  sodium chloride infusion, , Intravenous, Continuous, Sherrian Divers, MD, Last Rate: 10 mL/hr at 12/05/2015 1026 .  Ampicillin-Sulbactam (UNASYN) 3 g in sodium chloride 0.9 % 100 mL IVPB, 3 g, Intravenous, Q8H, Darl Householder Masters, RPH, 3  g at Nov 13, 2015 1210 .  aspirin tablet 325 mg, 325 mg, Oral, QHS, Randalyn Rhea, MD, 325 mg at 11/08/15 2216 .  atorvastatin (LIPITOR) tablet 80 mg, 80 mg, Oral, q1800, Chrystie Nose, MD, 80 mg at 11/08/15 1720 .  carvedilol (COREG) tablet 6.25 mg, 6.25 mg, Oral, BID WC, Penny Pia, MD, 6.25 mg at 11-13-15 0945 .  cholecalciferol (VITAMIN D) tablet 2,000 Units, 2,000 Units, Oral, Daily, Lauren Art, DO, 2,000 Units at Nov 13, 2015 0946 .  collagenase (SANTYL) ointment, , Topical, Daily, Renae Fickle, MD .  Darbepoetin Alfa (ARANESP) injection 60 mcg, 60 mcg, Subcutaneous, Q14 Days, Terrial Rhodes, MD, 60 mcg at 11/03/15 1607 .  docusate sodium (COLACE) capsule 100 mg, 100 mg, Oral, BID, Malikye Art, DO, 100 mg at  11/13/15 0946 .  feeding supplement (GLUCERNA SHAKE) (GLUCERNA SHAKE) liquid 237 mL, 237 mL, Oral, TID BM, Penny Pia, MD, 237 mL at 11/08/15 1258 .  gi cocktail (Maalox,Lidocaine,Donnatal), 30 mL, Oral, BID PRN, Leda Gauze, NP .  heparin injection 5,000 Units, 5,000 Units, Subcutaneous, Q8H, Emi Holes, RPH, 5,000 Units at 11/08/15 2217 .  HYDROmorphone (DILAUDID) injection 0.25-0.5 mg, 0.25-0.5 mg, Intravenous, Q5 min PRN, Sherrian Divers, MD .  insulin aspart (novoLOG) injection 0-15 Units, 0-15 Units, Subcutaneous, Q4H, Bernadene Person, NP .  methocarbamol (ROBAXIN) tablet 500 mg, 500 mg, Oral, Q6H PRN, Renae Fickle, MD .  multivitamin with minerals tablet 1 tablet, 1 tablet, Oral, Daily, Penny Pia, MD, 1 tablet at 11/08/15 1013 .  nitroGLYCERIN (NITROSTAT) SL tablet 0.4 mg, 0.4 mg, Sublingual, Q5 min PRN, Renae Fickle, MD, 0.4 mg at 11/08/15 1915 .  ondansetron (ZOFRAN) tablet 4 mg, 4 mg, Oral, Q6H PRN **OR** ondansetron (ZOFRAN) injection 4 mg, 4 mg, Intravenous, Q6H PRN, Ivaan Art, DO, 4 mg at 10/28/15 0104 .  prochlorperazine (COMPAZINE) injection 10 mg, 10 mg, Intravenous, Once PRN, Sherrian Divers, MD .  propofol (DIPRIVAN) 1000 MG/100ML infusion, 5-50 mcg/kg/min, Intravenous, Continuous, Bernadene Person, NP, Last Rate: 12.1 mL/hr at 2015/11/13 1444, 30 mcg/kg/min at 11/13/15 1444 .  propofol (DIPRIVAN) 1000 MG/100ML infusion, , , ,  .  senna-docusate (Senokot-S) tablet 1 tablet, 1 tablet, Oral, QHS PRN, Peyson Art, DO, 1 tablet at 11/02/2015 2047 .  thiamine (VITAMIN B-1) tablet 100 mg, 100 mg, Oral, Daily, Darl Householder Masters, RPH, 100 mg at 11/13/2015 0946 . sodium chloride    . sodium chloride 10 mL/hr at 2015-11-13 1026  . propofol (DIPRIVAN) infusion 30 mcg/kg/min (11/13/2015 1444)   TELE:     ECG: NSR  Radiology/Studies: Dg Chest Port 1 View  11/08/2015  CLINICAL DATA:  40 year old with generalized chest pain associated with shortness of breath  and cough which began earlier today. Current history of diabetes. EXAM: PORTABLE CHEST 1 VIEW COMPARISON:  10/29/2015 and earlier. FINDINGS: Markedly suboptimal inspiration accounts for atelectasis in the lung bases and accentuates the cardiac silhouette. Taking this into account, cardiac silhouette upper normal in size. Lungs otherwise clear. Bronchovascular markings normal. Pulmonary vascularity normal. No visible pleural effusions. IMPRESSION: Suboptimal inspiration accounts for bibasilar atelectasis. No acute cardiopulmonary disease otherwise. Electronically Signed   By: Hulan Saas M.D.   On: 11/08/2015 20:58     Current Medications:  . ampicillin-sulbactam (UNASYN) IV  3 g Intravenous Q8H  . aspirin  325 mg Oral QHS  . atorvastatin  80 mg Oral q1800  . carvedilol  6.25 mg Oral BID WC  . cholecalciferol  2,000 Units Oral  Daily  . collagenase   Topical Daily  . darbepoetin (ARANESP) injection - NON-DIALYSIS  60 mcg Subcutaneous Q14 Days  . docusate sodium  100 mg Oral BID  . feeding supplement (GLUCERNA SHAKE)  237 mL Oral TID BM  . heparin subcutaneous  5,000 Units Subcutaneous Q8H  . insulin aspart  0-15 Units Subcutaneous Q4H  . multivitamin with minerals  1 tablet Oral Daily  . propofol      . thiamine  100 mg Oral Daily   . sodium chloride    . sodium chloride 10 mL/hr at 23-Nov-2015 1026  . propofol (DIPRIVAN) infusion 30 mcg/kg/min (Nov 23, 2015 1444)    ASSESSMENT AND PLAN: Active Problems:   Hyperglycemia due to type 2 diabetes mellitus (HCC)   Hyponatremia   Essential hypertension   Diabetic neuropathy, type II diabetes mellitus (HCC)   Peri-rectal abscess   CKD (chronic kidney disease)   Leukocytosis   AKI (acute kidney injury) (HCC)   Pressure ulcer   Abscess, gluteal, left   Precordial pain   NSTEMI (non-ST elevated myocardial infarction) (HCC)   Pericardial effusion   Stroke (HCC)   Perirectal abscess   1. Cardiac arrest/ Elevated troponin : Pt. Has had  complicated hospital course. He was initially admitted for debridement of his peri rectal abscess, shortly after he was unable to move his legs (prior to hospitalization he was walking with a cane). He went for MRI brain and spine that showed multiple small foci, consistent with acute infarcts in the bilateral MCA and left ACA territory. Embolic infarctions noted in his spine as well.   He has had episodes of chest pain during this admission, and elevated troponin 0.08>>0.20>>0.32. Cardiology was consulted and he was not a candidate for cath due to chronic kidney disease (not on HD).  Echo was done, and there were no wall motion abnormalities. He was started on heparin gtt that has since been discontinued.   He had chest pain last night (11/08/15) and rapid response was called. Pain was stabbing to the right of his sternum.  Troponin was 0.20>>0.40.   Patient had 2 min PEA arrest in OR today during colostomy placement. Patient is currently intubated in ICU.  He does follows commands. MD to advise on restarting heparin vs cath? His baseline Scr is 1.8. It is elevated at 2.42 today. Nephrology has   EKG does not have any ST elevation, there are some non specific ST abnormalities on EKG from 10/31/15.   Signed, Little Ishikawa , NP 2:51 PM 11-23-15 Pager [phone]  Patient seen, examined. Available data reviewed. Agree with findings, assessment, and plan as outlined by Suzzette Righter, NP. Pt with PEA arrest during colostomy surgery today. Exam as outlined above. Hospital records and previous cardiac evaluation reviewed. Echo and TEE were normal preoperatively. Currently his BP and pulse are stable, vent requirements minimal and oxygen saturation 100%. Heart is RRR without murmur, lungs CTA, abdomen soft, extremities with mild edema in the legs. EKG shows NSR without ST changes. Unclear etiology of arrest. Ischemic heart disease is certainly in the differential and I think he will likely require cardiac cath as  he's had intermittent CP and mildly elevated troponin. Main question is timing of cath. Plan as follows:  Stat echo  Cycle troponin  Transfuse 1 unit prbc's as HgB 7.1 post-operatively  Hold on cath unless major rise in troponin or new WMA on echo. Considering his stability, I think better to wait on invasive cardiac evaluation until he is  further out from surgery, HGB increased, and creatinine stabilized.  Discussed plan with Dr Tyson Alias.  The patient is critically ill with multiple organ systems failure and requires high complexity decision making for assessment and support, frequent evaluation and titration of therapies, application of advanced monitoring technologies and extensive interpretation of multiple databases.   Critical Care Time devoted to patient care services described in this note is 40 minutes   Tonny Bollman, M.D. 11/22/2015 4:29 PM

## 2015-11-09 NOTE — Anesthesia Preprocedure Evaluation (Addendum)
Anesthesia Evaluation  Patient identified by MRN, date of birth, ID band Patient awake  General Assessment Comment:Active Problems:  Hyperglycemia due to type 2 diabetes mellitus (HCC)  Hyponatremia  Essential hypertension  Diabetic neuropathy, type II diabetes mellitus (HCC)  Peri-rectal abscess  CKD (chronic kidney disease)  Leukocytosis  AKI (acute kidney injury) (HCC)  Pressure ulcer  Abscess, gluteal, left  Precordial pain  NSTEMI (non-ST elevated myocardial infarction) (HCC)  Pericardial effusion  Reviewed: Allergy & Precautions, NPO status , Patient's Chart, lab work & pertinent test results  Airway Mallampati: II  TM Distance: >3 FB Neck ROM: Full    Dental no notable dental hx.    Pulmonary neg pulmonary ROS,    Pulmonary exam normal breath sounds clear to auscultation       Cardiovascular Exercise Tolerance: Good hypertension, Pt. on medications + Past MI  negative cardio ROS Normal cardiovascular exam+ Valvular Problems/Murmurs MR  Rhythm:Regular Rate:Normal  EF 60-65% Mild MR  ECHO 10-30-15: Impressions:  - Normal LV systolic function; grade 2 diastolic dysfunction; mild  TR; mildly elevated pulmonary pressure; small pericardial  effusion; respiratory variation across MV and IVC dilated; left  pleural effusion.   Neuro/Psych  Neuromuscular disease CVA negative psych ROS   GI/Hepatic negative GI ROS, Neg liver ROS, H/o Perirectal abscess   Endo/Other  negative endocrine ROSdiabetes, Poorly Controlled, Type 1, Insulin Dependent  Renal/GU Renal InsufficiencyRenal disease Creat 2.42 BUN 44  K 4.1  negative genitourinary   Musculoskeletal negative musculoskeletal ROS (+) Left gluteal abscess   Abdominal   Peds negative pediatric ROS (+)  Hematology  (+) anemia , Hb 8.1 Hct 25.1  RBC 2.7   Anesthesia Other Findings   Reproductive/Obstetrics negative OB ROS                        BP Readings from Last 3 Encounters:  11/27/2015 115/66  10/22/15 131/82  08/30/15 118/74   Lab Results  Component Value Date   WBC 48.1* 11/08/2015   HGB 8.1* 11/08/2015   HCT 25.1* 11/08/2015   MCV 83.1 11/08/2015   PLT 649* 11/08/2015     Chemistry      Component Value Date/Time   NA 132* 11/11/2015 0652   K 4.1 12/06/2015 0652   CL 102 12/01/2015 0652   CO2 17* 11/30/2015 0652   BUN 44* 12/02/2015 0652   CREATININE 2.42* 11/27/2015 0652   CREATININE 1.57* 04/12/2015 1052      Component Value Date/Time   CALCIUM 7.7* 11/26/2015 0652   ALKPHOS 521* 10/29/2015 0311   AST 30 10/29/2015 0311   ALT 8* 10/29/2015 0311   BILITOT 1.0 10/29/2015 0311     No results found for: INR, PROTIME  Lab Results  Component Value Date   HGBA1C 14.6* 10/22/2015   EKG 11/08/15: Normal sinus rhythm Low voltage QRS Cannot rule out Septal infarct , age undetermined Abnormal ECG   Anesthesia Physical  Anesthesia Plan  ASA: III  Anesthesia Plan: General   Post-op Pain Management:    Induction: Intravenous  Airway Management Planned: Oral ETT  Additional Equipment:   Intra-op Plan:   Post-operative Plan: Extubation in OR  Informed Consent: I have reviewed the patients History and Physical, chart, labs and discussed the procedure including the risks, benefits and alternatives for the proposed anesthesia with the patient or authorized representative who has indicated his/her understanding and acceptance.   Dental advisory given  Plan Discussed with: CRNA  Anesthesia Plan Comments: (  Troponin elevated at 0.4 today. Discussed with Dr. Malachi Bonds and she is not too concerned about a current cardiac event. She discussed with cardiology and feel he is OK for surgery today. ECHO recently was OK.)       Anesthesia Quick Evaluation

## 2015-11-09 NOTE — Anesthesia Postprocedure Evaluation (Signed)
Anesthesia Post Note  Patient: BENAJMIN DUNHAM  Procedure(s) Performed: Procedure(s) (LRB): LAPAROSCOPIC DIVERTED COLOSTOMY (N/A)  Patient location during evaluation: ICU Anesthesia Type: General Level of consciousness: sedated Pain management: pain level controlled Vital Signs Assessment: post-procedure vital signs reviewed and stable Respiratory status: patient remains intubated per anesthesia plan Cardiovascular status: stable Anesthetic complications: yes Anesthetic complication details: anesthesia complicationsComments: See progress note. Patient experienced PEA at about 1300. Required 1-2 minutes CPR then ROSC. Report given to Dr. Malachi Bonds and Dr. Tyson Alias.    Last Vitals:  Filed Vitals:   11/17/2015 1400 11/27/2015 1415  BP: 112/79   Pulse: 94 92  Temp:    Resp: 17 12    Last Pain:  Filed Vitals:   11/16/2015 1418  PainSc: 7                  Shaconda Hajduk J

## 2015-11-09 NOTE — Progress Notes (Signed)
PROGRESS NOTE    Shane Dougherty  ZOX:[id number] DOB: 09-10-1975 DOA: 10/28/2015 PCP: Jaclyn Shaggy, MD   Outpatient Specialists: Jaclyn Shaggy, MD  Brief Narrative:  Shane Dougherty is a 40 y.o. male with medical history significant of diabetes. He complained of diarrhea x 1 month prior to admission. A few days prior to 4/16, he developed left buttock pain. He was seen in the ER where he was diagnosed with intergluteal infection with small abscess. An I/D was done that some purulent drainage. He was given norco, clindamycin and told to do warm soaks. He presented back at [facility] with worsening pain and pressure and was tx to [facility] for further I&D in operating room.  He had urinary retention and a foley was placed.  He has undergone two debridements in the OR and general surgery is recommending colostomy placement to allow for wound healing.  His course has been complicated by intermittent chest pains with mildly elevated troponins, followed by cardiology, and by fluctuating kidney function.    4/21: I&D rectal abscess 5/2:  I&D rectal abscess 5/4:  Laparoscopic colostomy placement, Cardiac arrest with ROSC in 2 minutes, tx to ICU  Assessment & Plan:   Active Problems:   Hyperglycemia due to type 2 diabetes mellitus (HCC)   Hyponatremia   Essential hypertension   Diabetic neuropathy, type II diabetes mellitus (HCC)   Peri-rectal abscess   CKD (chronic kidney disease)   Leukocytosis   AKI (acute kidney injury) (HCC)   Pressure ulcer   Abscess, gluteal, left   Precordial pain   NSTEMI (non-ST elevated myocardial infarction) (HCC)   Pericardial effusion   Stroke [facility])   Perirectal abscess  Cardiac arrest on 5/4 with ROSC in 2 minutes at end of colostomy placement surgery -  Remains intubated post surgery -  Transfer to ICU, sign out given to Micah Flesher, MD, PCCM -  ECG, CMP, CBC, lactic acid, troponin pending -  Reconsulted cardiology  Peri-rectal abscess with 1  month of diarrhea.  C. Diff negative.  Colostomy placed on 5/4 to allow for wound healing. - s/p I&D 4/21 and again on 5/2 - blood cultures- NGTD - culture from I&D:  group B strep - HIV negative - Continue IV Unasyn -  Santyl ordered per WOC recommendations  NSTEMI (peak troponin of 0.54), previously seen by cardiology who have since signed off.  He had ECHO with normal EF and no RWMA.  He was started on aspirin.   -  ECG unchanged -  CXR unchanged  -  Troponins again mildly positive -  Continue prn nitroglycerin -  Continue BB and high dose statin -  Cardiology assistance appreciated  Possible Crohn's disease -  GI consult and recommended primary treatment of his perirectal abscess. With improvement in renal function they recommend more advanced imaging to evaluate small bowel such a CT enterography or MR enterography. We will plan on having GI evaluated patient as an outpatient.  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  TSH, folate, vitamin b12, and ferritin levels elevated -  Transfused 1 unit PRBC on 5/3  DVT prophylaxis:   heparin Code Status:  Full Code Family  Communication:  Patient and brother who was at bedside Disposition Plan:  Transfer to ICU  Consultants:   CCS  Nephrology  Cardiology  Neurology  Procedures:   I&D 4/21  I&D 5/2   Subjective: Had some epigastric pain last night with some shortness of breath and diaphoresis.  ECG was unchanged.  Troponins similarly elevated to prior.  Pain was already getting better prior to nitroglycerin.  This morning, had another transient, less severe episode of lower chest/epigastric pain.  Worried that he remains weak and wants to know what he can do to improve his weakness and his low albumin.    Objective: Filed Vitals:   11/13/2015 0518 11/15/2015 1400 12/05/2015 1405 11/10/2015 1415  BP: 115/66 112/79 123/75   Pulse: 82 94 91 92  Temp: 97.8 F (36.6 C)     TempSrc: Oral     Resp: 14 17 15 12   Height:      Weight:      SpO2: 100% 100% 100% 100%    Intake/Output Summary (Last 24 hours) at 11/23/2015 1429 Last data filed at 11/08/2015 1424  Gross per 24 hour  Intake   1885 ml  Output    450 ml  Net   1435 ml   Filed Weights   10/19/2015 1154 10/11/2015 1800  Weight: 65.318 kg (144 lb) 67.132 kg (148 lb)    Examination:  GEN:  Adult male, NAD HEENT:  NCAT, MMM CV:  RRR, normal S1 and S2 PULM:  CTAB GI:  NABS, moderately distended, TTP in the RUQ and RLQ without rebound or guarding.   MSK:  1+ bilateral pitting edema, decreased bulk and tone Neuro:  No facial droop.  Left arm 3/5, Left leg 3/5 hip flexion and barely able to wiggle ankle, otherwise, no movement.  Right arm 5/5, right hip 3/5, otherwise RLE flaccid   Data Reviewed: I have personally reviewed following labs and imaging studies  CBC:  Recent Labs Lab 11/03/15 0445 11/04/15 0424 11/06/15 0631 11/08/15 1001 11/08/15 2348  WBC 40.2* 34.7* 43.3* 43.4* 48.1*  HGB 8.0* 7.5* 7.4* 7.2* 8.1*  HCT 23.9* 23.6* 22.3* 22.1* 25.1*  MCV 78.1 80.3 80.5 81.9 83.1  PLT 484* 464* 575* 620* 649*   Basic Metabolic  Panel:  Recent Labs Lab 11/05/15 0728 11/06/15 0631 11/30/2015 0717 11/08/15 0600 12/04/2015 0652  NA 132* 132* 134* 132* 132*  K 3.7 3.9 3.8 4.0 4.1  CL 105 103 104 103 102  CO2 17* 18* 17* 18* 17*  GLUCOSE 226* 212* 194* 165* 103*  BUN 34* 34* 38* 39* 44*  CREATININE 1.84* 1.74* 2.10* 2.31* 2.42*  CALCIUM 7.6* 7.7* 7.7* 7.6* 7.7*  PHOS 4.1 4.7* 4.6 5.4* 5.7*   GFR: Estimated Creatinine Clearance: 37.9 mL/min (by C-G formula based on Cr of 2.42). Liver Function Tests:  Recent Labs Lab 11/05/15 0728 11/06/15 0631 11/12/2015 0717 11/08/15 0600 11/09/15 0652  ALBUMIN 1.0* <1.0* <1.0* <1.0* <1.0*   No results for input(s): LIPASE, AMYLASE in the last 168 hours. No results  for input(s): AMMONIA in the last 168 hours. Coagulation Profile: No results for input(s): INR, PROTIME in the last 168 hours. Cardiac Enzymes:  Recent Labs Lab 10/23/2015 1445 10/22/2015 2035 11/08/15 2348 11/13/2015 0652  TROPONINI 0.25* 0.15* 0.20* 0.40*   BNP (last 3 results) No results for input(s): PROBNP in the last 8760 hours. HbA1C: No results for input(s): HGBA1C in the last 72 hours. CBG:  Recent Labs Lab 11/08/15 1240 11/08/15 1739 11/08/15 2117 12/01/2015 0807 11/21/2015 1018  GLUCAP 177* 128* 109* 97 83   Lipid Profile: No results for input(s): CHOL, HDL, LDLCALC, TRIG, CHOLHDL, LDLDIRECT in the last 72 hours. Thyroid Function Tests: No results for input(s): TSH, T4TOTAL, FREET4, T3FREE, THYROIDAB in the last 72 hours. Anemia Panel: No results for input(s): VITAMINB12, FOLATE, FERRITIN, TIBC, IRON, RETICCTPCT in the last 72 hours. Urine analysis:    Component Value Date/Time   COLORURINE YELLOW 10/29/2015 1020   APPEARANCEUR TURBID* 10/29/2015 1020   LABSPEC 1.026 10/29/2015 1020   PHURINE 5.0 10/29/2015 1020   GLUCOSEU NEGATIVE 10/29/2015 1020   HGBUR MODERATE* 10/29/2015 1020   BILIRUBINUR NEGATIVE 10/29/2015 1020   KETONESUR NEGATIVE 10/29/2015 1020   PROTEINUR 100*  10/29/2015 1020   UROBILINOGEN 1.0 08/20/2014 1042   NITRITE NEGATIVE 10/29/2015 1020   LEUKOCYTESUR MODERATE* 10/29/2015 1020   Recent Results (from the past 240 hour(s))  MRSA PCR Screening     Status: None   Collection Time: 10/30/15  7:24 PM  Result Value Ref Range Status   MRSA by PCR NEGATIVE NEGATIVE Final    Comment:        The GeneXpert MRSA Assay (FDA approved for NASAL specimens only), is one component of a comprehensive MRSA colonization surveillance program. It is not intended to diagnose MRSA infection nor to guide or monitor treatment for MRSA infections.   Culture, blood (routine x 2)     Status: None   Collection Time: 11/01/15 12:20 AM  Result Value Ref Range Status   Specimen Description BLOOD RIGHT ARM  Final   Special Requests IN PEDIATRIC BOTTLE  Final   Culture NO GROWTH 5 DAYS  Final   Report Status 11/06/2015 FINAL  Final  Culture, blood (routine x 2)     Status: None   Collection Time: 11/01/15 12:46 AM  Result Value Ref Range Status   Specimen Description BLOOD LEFT HAND  Final   Special Requests BOTTLES DRAWN AEROBIC AND ANAEROBIC  Final   Culture NO GROWTH 5 DAYS  Final   Report Status 11/06/2015 FINAL  Final  Surgical pcr screen     Status: None   Collection Time: 12/01/15  4:43 AM  Result Value Ref Range Status   MRSA, PCR NEGATIVE NEGATIVE Final   Staphylococcus aureus NEGATIVE NEGATIVE Final    Comment:        The Xpert SA Assay (FDA approved for NASAL specimens in patients over 61 years of age), is one component of a comprehensive surveillance program.  Test performance has been validated by [facility] for patients greater than or equal to 41 year old. It is not intended to diagnose infection nor to guide or monitor treatment.       Anti-infectives    Start     Dose/Rate Route Frequency Ordered Stop   11/03/15 1200  Ampicillin-Sulbactam (UNASYN) 3 g in sodium chloride 0.9 % 100 mL IVPB     3 g 100 mL/hr over 60  Minutes Intravenous Every 8 hours 11/03/15 1104  10/31/15 1300  ampicillin-sulbactam (UNASYN) 1.5 g in sodium chloride 0.9 % 50 mL IVPB  Status:  Discontinued     1.5 g 100 mL/hr over 30 Minutes Intravenous Every 12 hours 10/31/15 1214 11/03/15 1104   10/29/15 2000  metroNIDAZOLE (FLAGYL) IVPB 500 mg  Status:  Discontinued     500 mg 100 mL/hr over 60 Minutes Intravenous Every 8 hours 10/29/15 1852 10/30/15 1312   10/29/15 0800  vancomycin (VANCOCIN) IVPB 750 mg/150 ml premix  Status:  Discontinued     750 mg 150 mL/hr over 60 Minutes Intravenous Every 24 hours 10/28/15 1058 10/31/15 0750   10/17/2015 0800  vancomycin (VANCOCIN) 500 mg in sodium chloride 0.9 % 100 mL IVPB  Status:  Discontinued     500 mg 100 mL/hr over 60 Minutes Intravenous Every 12 hours 10/09/2015 1931 10/28/15 1058   10/24/2015 0200  piperacillin-tazobactam (ZOSYN) IVPB 3.375 g  Status:  Discontinued     3.375 g 12.5 mL/hr over 240 Minutes Intravenous Every 8 hours 10/07/2015 1938 10/31/15 1205   10/30/2015 2000  piperacillin-tazobactam (ZOSYN) IVPB 3.375 g     3.375 g 100 mL/hr over 30 Minutes Intravenous  Once 10/14/2015 1903 10/30/2015 2117   10/30/2015 2000  vancomycin (VANCOCIN) IVPB 1000 mg/200 mL premix     1,000 mg 200 mL/hr over 60 Minutes Intravenous  Once 10/15/2015 1903 11/04/2015 2147       Radiology Studies: Dg Chest Port 1 View  11/08/2015  CLINICAL DATA:  40 year old with generalized chest pain associated with shortness of breath and cough which began earlier today. Current history of diabetes. EXAM: PORTABLE CHEST 1 VIEW COMPARISON:  10/29/2015 and earlier. FINDINGS: Markedly suboptimal inspiration accounts for atelectasis in the lung bases and accentuates the cardiac silhouette. Taking this into account, cardiac silhouette upper normal in size. Lungs otherwise clear. Bronchovascular markings normal. Pulmonary vascularity normal. No visible pleural effusions. IMPRESSION: Suboptimal inspiration accounts for bibasilar  atelectasis. No acute cardiopulmonary disease otherwise. Electronically Signed   By: Hulan Saas M.D.   On: 11/08/2015 20:58        Scheduled Meds: . ampicillin-sulbactam (UNASYN) IV  3 g Intravenous Q8H  . aspirin  325 mg Oral QHS  . atorvastatin  80 mg Oral q1800  . carvedilol  6.25 mg Oral BID WC  . cholecalciferol  2,000 Units Oral Daily  . collagenase   Topical Daily  . darbepoetin (ARANESP) injection - NON-DIALYSIS  60 mcg Subcutaneous Q14 Days  . docusate sodium  100 mg Oral BID  . feeding supplement (GLUCERNA SHAKE)  237 mL Oral TID BM  . heparin subcutaneous  5,000 Units Subcutaneous Q8H  . insulin aspart  0-9 Units Subcutaneous TID WC  . insulin glargine  15 Units Subcutaneous QHS  . multivitamin with minerals  1 tablet Oral Daily  . thiamine  100 mg Oral Daily   Continuous Infusions: . sodium chloride    . sodium chloride 10 mL/hr at 2015/11/20 1026     LOS: 14 days    Time spent: 30 min   Renae Fickle, MD [facility] Pager [phone]  If 7PM-7AM, please contact night-coverage [email or web address] Password [email or web address] 11-20-15, 2:29 PM

## 2015-11-09 NOTE — Progress Notes (Signed)
NIF and VC not done at this time. Patient not in room.

## 2015-11-09 NOTE — Progress Notes (Signed)
PT Cancellation Note  Patient Details Name: Shane Dougherty MRN: [id number] DOB: December 16, 1975   Cancelled Treatment:    Reason Eval/Treat Not Completed: Patient declined, due to going to surgery today. " I just need a break. I'll work with you after it."  Will check back tomorrow.   Kazandra Forstrom LUBECK 11/13/2015, 9:00 AM

## 2015-11-09 NOTE — Progress Notes (Signed)
Placed on tele to monitor  

## 2015-11-09 NOTE — Plan of Care (Addendum)
Repeat CBG 113 

## 2015-11-09 NOTE — Progress Notes (Signed)
eLink Physician-Brief Progress Note Patient Name: Shane Dougherty DOB: 1976-01-04 MRN: [id number]   Date of Service  11/26/2015  HPI/Events of Note  Troponin 0.4 >> 0.62.  PEA arrest in OR today. Already on Coreg and ASA. Cardiology did not elect to restart Heparin IV infusion earlier. Given recent surgery would prefer to hold off on Heparin IV unless troponin dramatically highter.   eICU Interventions  Will continue present Rx and continue to trend troponin.     Intervention Category Intermediate Interventions: Diagnostic test evaluation  [name],Steven Eugene 11/08/2015, 10:40 PM

## 2015-11-09 NOTE — Progress Notes (Signed)
I was called to OR at 12:59 for low blood pressure and low heart rate. Upon my arrival, BP 50/30, HR 45. Ephedrine, phenylephrine and atropine had been given. Bed placed in trendelenburg, bp repeated, ETCO2 decreased, noted that no carotid pulse felt. Epinephrine given IV. Drapes withdrawn and CPR begun. After about 1-2 minutes of CPR, return of spontaneous circulation noted. Then hypertensive to 230/140. This hypertension gradually resolved. His muscle relaxant was reversed, and he was observed for emergence. He did open his eyes and track my eyes and he did close fingers of right hand to command, but he would not lift head and so he was left intubated and taken to 2300. Report will be called to Dr. Malachi Bonds.

## 2015-11-09 NOTE — Plan of Care (Signed)
CBG 68, 25 cc dextrose 50% given

## 2015-11-09 NOTE — Consult Note (Addendum)
PULMONARY / CRITICAL CARE MEDICINE   Name: Shane Dougherty MRN: [id number] DOB: 1975/12/27    ADMISSION DATE:  10/16/2015 CONSULTATION DATE:    REFERRING MD:  5/4  CHIEF COMPLAINT:  Post arrest   HISTORY OF PRESENT ILLNESS:   40yo male with hx uncontrolled DM, CKD initially admitted 4/20 with perirectal abscess and AKI on CKD.  Course has been long and complicated including multiple OR trips for debridement, probable embolic CVA, BLE weakness with ?cord infarct, chest pain (with neg w/u per cards but did not include cath), worsening renal function and ongoing diarrhea severely complicating wound care prompting return to OR for elective diverting colostomy.  Towards the end of the OR case he developed severe hypotension, bradycardia and ultimately PEA arrest with only 2-3 minutes CPR prior to ROSC.  Left intubated, tx to ICU and PCCM consulted to assist.    PAST MEDICAL HISTORY :  He  has a past medical history of Diabetes mellitus without complication (HCC); Bell's palsy (08/2014); and CKD (chronic kidney disease).  PAST SURGICAL HISTORY: He  has past surgical history that includes Incise and drain abcess (11/03/2015); Incision and drainage abscess (Left, 10/18/2015); TEE without cardioversion (N/A, 10/10/2015); and Incision and drainage perirectal abscess (N/A, 11/25/15).  Allergies  Allergen Reactions  . Bee Venom Swelling    Throat involvement    No current facility-administered medications on file prior to encounter.   Current Outpatient Prescriptions on File Prior to Encounter  Medication Sig  . acetaminophen (TYLENOL) 500 MG tablet Take 500-1,000 mg by mouth every 6 (six) hours as needed for mild pain or moderate pain.  . clindamycin (CLEOCIN) 300 MG capsule Take 1 capsule (300 mg total) by mouth 4 (four) times daily. X 7 days  . HYDROcodone-acetaminophen (NORCO) 5-325 MG tablet Take 1-2 tablets by mouth every 4 (four) hours as needed. (Patient taking differently: Take 1-2 tablets  by mouth every 4 (four) hours as needed for moderate pain. )  . insulin glargine (LANTUS) 100 UNIT/ML injection Inject 0.25 mLs (25 Units total) into the skin at bedtime.  . insulin lispro (HUMALOG) 100 UNIT/ML injection Inject 0.1 mLs (10 Units total) into the skin 3 (three) times daily with meals. Your sliding scale with meals.  . polyvinyl alcohol (LIQUIFILM TEARS) 1.4 % ophthalmic solution Place 3-4 drops into both eyes as needed for dry eyes.    FAMILY HISTORY:  His indicated that his mother is deceased. He indicated that his father is deceased.   SOCIAL HISTORY: He  reports that he has been passively smoking.  He has never used smokeless tobacco. He reports that he drinks alcohol. He reports that he does not use illicit drugs.  REVIEW OF SYSTEMS:   Unable   SUBJECTIVE:  vented   VITAL SIGNS: BP 97/68 mmHg  Pulse 83  Temp(Src) 97.5 F (36.4 C) (Oral)  Resp 14  Ht 5\' 7"  (1.702 m)  Wt 67.132 kg (148 lb)  BMI 23.17 kg/m2  SpO2 100%  HEMODYNAMICS:    VENTILATOR SETTINGS: Vent Mode:  [-] PRVC FiO2 (%):  [40 %-50 %] 40 % Set Rate:  [12 bmp] 12 bmp Vt Set:  [600 mL] 600 mL PEEP:  [5 cmH20] 5 cmH20 Pressure Support:  [10 cmH20] 10 cmH20 Plateau Pressure:  [24 cmH20] 24 cmH20  INTAKE / OUTPUT: I/O last 3 completed shifts: In: 725 [P.O.:360; Blood:365] Out: 500 [Urine:500]  PHYSICAL EXAMINATION: General:  Chronically ill appearing male, no sig distress post arrest  Neuro:  Sedated post  op but opens eyes to voice, nods, follows some commands  HEENT:  Mm moist, no JVD, ETT  Cardiovascular:  s1s2 rrr Lungs:  resps even non labored on vent, few scattered crackles  Abdomen:  Round, soft, incision c/d, ostomy with min outpt  Musculoskeletal:  Warm and dry, scant BLE edema    LABS:  BMET  Recent Labs Lab 11/12/2015 0717 11/08/15 0600 20-Nov-2015 0652  NA 134* 132* 132*  K 3.8 4.0 4.1  CL 104 103 102  CO2 17* 18* 17*  BUN 38* 39* 44*  CREATININE 2.10* 2.31* 2.42*   GLUCOSE 194* 165* 103*    Electrolytes  Recent Labs Lab 12/03/2015 0717 11/08/15 0600 11/20/2015 0652  CALCIUM 7.7* 7.6* 7.7*  PHOS 4.6 5.4* 5.7*    CBC  Recent Labs Lab 11/06/15 0631 11/08/15 1001 11/08/15 2348  WBC 43.3* 43.4* 48.1*  HGB 7.4* 7.2* 8.1*  HCT 22.3* 22.1* 25.1*  PLT 575* 620* 649*    Coag's No results for input(s): APTT, INR in the last 168 hours.  Sepsis Markers No results for input(s): LATICACIDVEN, PROCALCITON, O2SATVEN in the last 168 hours.  ABG  Recent Labs Lab 11/20/15 1458  PHART 7.376  PCO2ART 30.7*  PO2ART 182.0*    Liver Enzymes  Recent Labs Lab 11/27/2015 0717 11/08/15 0600 11/20/2015 0652  ALBUMIN <1.0* <1.0* <1.0*    Cardiac Enzymes  Recent Labs Lab 10/12/2015 2035 11/08/15 2348 Nov 20, 2015 0652  TROPONINI 0.15* 0.20* 0.40*    Glucose  Recent Labs Lab 11/08/15 0937 11/08/15 1240 11/08/15 1739 11/08/15 2117 11/20/15 0807 Nov 20, 2015 1018  GLUCAP 144* 177* 128* 109* 97 83    Imaging Dg Chest Port 1 View  11/08/2015  CLINICAL DATA:  40 year old with generalized chest pain associated with shortness of breath and cough which began earlier today. Current history of diabetes. EXAM: PORTABLE CHEST 1 VIEW COMPARISON:  10/29/2015 and earlier. FINDINGS: Markedly suboptimal inspiration accounts for atelectasis in the lung bases and accentuates the cardiac silhouette. Taking this into account, cardiac silhouette upper normal in size. Lungs otherwise clear. Bronchovascular markings normal. Pulmonary vascularity normal. No visible pleural effusions. IMPRESSION: Suboptimal inspiration accounts for bibasilar atelectasis. No acute cardiopulmonary disease otherwise. Electronically Signed   By: Hulan Saas M.D.   On: 11/08/2015 20:58     STUDIES:  4/25 MRI brain>> Multiple small foci of restricted diffusion in both cerebral hemispheres most consistent with embolic infarctions. Echo 4/24>>>EF 55-60%, grade 2 diastolic dysfunction, PA  pressure , small pericardial effusion  2D echo 5/4>>>  CULTURES: 4/23 CDiff>>> neg  4/26 BCx2>>> neg  4/21 abscess>>> mod group B strep   ANTIBIOTICS: unasyn 4/28>>>  SIGNIFICANT EVENTS: 4/21>> OR for I&D L gluteal abscess  5/2>> OR for sacral debridement  5/4>> OR for diverting colostomy>> PEA arrest   LINES/TUBES: ETT 5/4>>>  DISCUSSION: 40yo male with uncontrolled DM with complicated course r/t peri-rectal abscess, sepsis, Acute on chronic renal failure.  Had brief intra-operative brady/PEA arrest with ROSC after 2-3 mins.   ASSESSMENT / PLAN:  PULMONARY Acute respiratory failure -- post brief arrest  P:   Vent support - 8cc/kg  F/u CXR  F/u ABG  Rest on vent overnight while ongoing cardiac w/u If remains stable would SBT in am and consider extubation   CARDIOVASCULAR PEA arrest - intra-op Chest pain  Elevated troponin  HTN NSTEMI  P:  Stat EKG  Echo ASAP  Trend troponin  Lactate, pct, stat chem  Cardiology to see  Cont BB  RENAL Acute on  CKD 3 - baseline Scr ~1.6-1.8 Hyponatremia  P:   Renal following  Stat chem - K was ok on am labs pre-arrest  Chem in am  No hydro on renal u/s   GASTROINTESTINAL Diarrhea -- ongoing.  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 I have discussed with resident/NP and other care providers such as pharmacist, RN and RRT. In addition, I personally evaluated patient and elicited key findings of: .awake, lungs crackles mild, waking up, concern ischemia, cards called and appreciated, abg reviewed, keep same MV, would lower volume, not on pressors, pcxr with fluid in fissure, ekg, asa, i discussed case with anesthesia, will NOT wean nore extubate tonight,unasyn, trop assessment, echo, pcxr now for ett, abg reviewed, keep same MV, to min O2 on vent The patient is critically ill with multiple organ systems failure and requires high complexity decision making for assessment and support, frequent evaluation and titration of therapies, application of advanced monitoring technologies and extensive interpretation of multiple databases.   Critical Care Time devoted to patient care services described in this note is 30 Minutes. This time reflects time of care of this signee: Rory Percy, MD FACP. This critical care time does not reflect procedure time, or teaching time or supervisory time of PA/NP/Med student/Med Resident etc but could involve care discussion time. Rest per NP/medical resident whose note is outlined above and that I agree with   [name]. Tyson Alias, MD, FACP Pgr: [phone] [facility] 12/06/2015 3:49 PM

## 2015-11-09 NOTE — Consult Note (Signed)
I have seen and examined this patient and agree with the plan of care   [facility] W 12/04/2015, 7:45 PM

## 2015-11-10 ENCOUNTER — Encounter (HOSPITAL_COMMUNITY): Payer: Self-pay | Admitting: Surgery

## 2015-11-10 DIAGNOSIS — Z9911 Dependence on respirator [ventilator] status: Secondary | ICD-10-CM

## 2015-11-10 LAB — GLUCOSE, CAPILLARY
GLUCOSE-CAPILLARY: 85 mg/dL (ref 65–99)
GLUCOSE-CAPILLARY: 110 mg/dL — AB (ref 65–99)
GLUCOSE-CAPILLARY: 125 mg/dL — AB (ref 65–99)
Glucose-Capillary: 111 mg/dL — ABNORMAL HIGH (ref 65–99)
Glucose-Capillary: 81 mg/dL (ref 65–99)
Glucose-Capillary: 84 mg/dL (ref 65–99)
Glucose-Capillary: 96 mg/dL (ref 65–99)

## 2015-11-10 LAB — RENAL FUNCTION PANEL
Anion gap: 16 — ABNORMAL HIGH (ref 5–15)
BUN: 45 mg/dL — AB (ref 6–20)
CO2: 16 mmol/L — ABNORMAL LOW (ref 22–32)
CREATININE: 2.44 mg/dL — AB (ref 0.61–1.24)
Calcium: 7.6 mg/dL — ABNORMAL LOW (ref 8.9–10.3)
Chloride: 104 mmol/L (ref 101–111)
GFR calc Af Amer: 36 mL/min — ABNORMAL LOW (ref >60)
GFR, EST NON AFRICAN AMERICAN: 31 mL/min — AB (ref >60)
Glucose, Bld: 84 mg/dL (ref 65–99)
PHOSPHORUS: 6.3 mg/dL — AB (ref 2.5–4.6)
Potassium: 4 mmol/L (ref 3.5–5.1)
Sodium: 136 mmol/L (ref 135–145)

## 2015-11-10 LAB — TYPE AND SCREEN
ABO/RH(D): B POS
ANTIBODY SCREEN: NEGATIVE
Unit division: 0
Unit division: 0

## 2015-11-10 LAB — POCT I-STAT 3, ART BLOOD GAS (G3+)
Acid-base deficit: 5 mmol/L — ABNORMAL HIGH (ref 0.0–2.0)
BICARBONATE: 17.6 meq/L — AB (ref 20.0–24.0)
O2 Saturation: 100 %
PCO2 ART: 26 mmHg — AB (ref 35.0–45.0)
PH ART: 7.439 (ref 7.350–7.450)
TCO2: 18 mmol/L (ref 0–100)
pO2, Arterial: 161 mmHg — ABNORMAL HIGH (ref 80.0–100.0)

## 2015-11-10 LAB — CBC
HCT: 27.5 % — ABNORMAL LOW (ref 39.0–52.0)
Hemoglobin: 9.1 g/dL — ABNORMAL LOW (ref 13.0–17.0)
MCH: 26.4 pg (ref 26.0–34.0)
MCHC: 33.1 g/dL (ref 30.0–36.0)
MCV: 79.7 fL (ref 78.0–100.0)
PLATELETS: 521 10*3/uL — AB (ref 150–400)
RBC: 3.45 MIL/uL — ABNORMAL LOW (ref 4.22–5.81)
RDW: 15.5 % (ref 11.5–15.5)
WBC: 29.4 10*3/uL — AB (ref 4.0–10.5)

## 2015-11-10 LAB — TROPONIN I
TROPONIN I: 0.81 ng/mL — AB (ref <0.031)
TROPONIN I: 0.24 ng/mL — AB (ref <0.031)
TROPONIN I: 0.23 ng/mL — AB (ref <0.031)
Troponin I: 0.18 ng/mL — ABNORMAL HIGH (ref <0.031)

## 2015-11-10 LAB — PATHOLOGIST SMEAR REVIEW

## 2015-11-10 LAB — PROCALCITONIN: Procalcitonin: 4.49 ng/mL

## 2015-11-10 MED ORDER — SODIUM CHLORIDE 0.9 % IV SOLN
INTRAVENOUS | Status: DC
  Administered 2015-11-10 – 2015-11-11 (×2): via INTRAVENOUS

## 2015-11-10 MED ORDER — ANTISEPTIC ORAL RINSE SOLUTION (CORINZ)
7.0000 mL | OROMUCOSAL | Status: DC
  Administered 2015-11-10 (×5): 7 mL via OROMUCOSAL

## 2015-11-10 MED ORDER — ANTISEPTIC ORAL RINSE SOLUTION (CORINZ)
7.0000 mL | Freq: Four times a day (QID) | OROMUCOSAL | Status: DC
  Administered 2015-11-10 – 2015-11-13 (×11): 7 mL via OROMUCOSAL

## 2015-11-10 MED ORDER — CHLORHEXIDINE GLUCONATE 0.12% ORAL RINSE (MEDLINE KIT)
15.0000 mL | Freq: Two times a day (BID) | OROMUCOSAL | Status: DC
  Administered 2015-11-10 – 2015-11-11 (×2): 15 mL via OROMUCOSAL

## 2015-11-10 NOTE — Progress Notes (Signed)
Nutrition Follow-up  DOCUMENTATION CODES:   Not applicable  INTERVENTION:   If nutrition support within GOC, tube feeding recommendations provided  Recomend Vital High Protein at 20 ml/hr (trickle) and advance by 10 ml/hr every 8 hours until goal of 70 ml/hr (1680 ml/d)  is reached.  TF+ propofol will provide 1786 kcals and 147 g of protein per day (meets >/= 90% estimated needs).   NUTRITION DIAGNOSIS:   Inadequate oral intake related to inability to eat as evidenced by NPO status.  GOAL:   Patient will meet greater than or equal to 90% of their needs  MONITOR:   Vent status, Labs, Weight trends, Skin, I & O's  ASSESSMENT:   Shane Dougherty is a 40 y.o. male with medical history significant of diabetes. He complains for diarrhea x 1 month. Nothing made better or worse. No history of crohn's/UC- no family hx either. He then a few days before 4/16 developed left buttock pain. He was seen in the ER where he was diagnosed with intergluteal infection with small abscess. An I/D was done that some purulent drainage. He was given norco, clindamycin and told to do warm soaks.    SIGNIFICANT EVENTS: 4/21>> OR for I&D L gluteal abscess  5/2>> OR for sacral debridement  5/4: Laparoscopic colostomy placement, Cardiac arrest with ROSC in 2 minutes, tx to ICU 5/4 Pt intubated  5/5 Per physician: "OK to start trickle tube feeds if no plan for extubation. If planning for extubation, would hold on feeds."   Pt on vent. Initiate TF of Vital High Protein at trickle (20 ml/hr). Temp (24hrs), Avg:97.4 F (36.3 C), Min:96.4 F (35.8 C), Max:97.7 F (36.5 C) Ve:10.7 ml Propofol @ 4 ml/hr providing 106 kcals/d.  NFPE: No muscle or fat depletion, mild edema.  Labs reviewed; BUN 45, creat 2.44, ca 7.6, GFR 31, phos 6.3.  Meds reviewed.     Diet Order:  Diet NPO time specified Except for: Sips with Meds  Skin:  Wound (see comment) (stage II sacrum, closed bilateral rectal  incision, open buttock wound)  Last BM:  5/5  Height:   Ht Readings from Last 1 Encounters:  10/21/2015 5\' 7"  (1.702 m)    Weight:   Wt Readings from Last 1 Encounters:  11/10/15 184 lb 1.4 oz (83.5 kg)    Ideal Body Weight:  67.13 kg  BMI:  Body mass index is 28.82 kg/(m^2).  Estimated Nutritional Needs:   Kcal:  1800-2000  Protein:  100-115 grams  Fluid:  >1.8 L  EDUCATION NEEDS:   No education needs identified at this time  Beryle Quant, MS [facility] Dietetic Intern Pager [phone]

## 2015-11-10 NOTE — Procedures (Signed)
760 VC, -30 NIF, good pt effort.

## 2015-11-10 NOTE — Progress Notes (Signed)
    Subjective:  Intubated. Denies CP.  Objective:  Vital Signs in the last 24 hours: Temp:  [96.4 F (35.8 C)-97.7 F (36.5 C)] 97.4 F (36.3 C) (05/05 0800) Pulse Rate:  [75-94] 79 (05/05 1200) Resp:  [10-18] 16 (05/05 1200) BP: (94-148)/(63-81) 112/68 mmHg (05/05 1200) SpO2:  [98 %-100 %] 100 % (05/05 1200) Arterial Line BP: (119-158)/(59-81) 137/65 mmHg (05/05 1200) FiO2 (%):  [30 %-50 %] 30 % (05/05 1047) Weight:  [184 lb 1.4 oz (83.5 kg)] 184 lb 1.4 oz (83.5 kg) (05/05 0200)  Intake/Output from previous day: 05/04 0701 - 05/05 0700 In: 3414 [I.V.:2704; Blood:350; NG/GT:150; IV Piggyback:200] Out: 955 [Urine:855; Emesis/NG output:50; Blood:50]  Physical Exam: Pt is alert awake, on ventilator, in NAD HEENT: normal Neck: JVP - normal Lungs: CTA bilaterally CV: RRR without murmur  Abd: soft, NT, ostomy site clear Ext: diffuse edema noted Skin: warm/dry no rash  Lab Results:  Recent Labs  2015/12/01 1610 11/10/15 0400  WBC 35.4* 29.4*  HGB 7.1* 9.1*  PLT 476* 521*    Recent Labs  12/01/15 1605 11/10/15 0400  NA 136 136  K 3.7 4.0  CL 110 104  CO2 15* 16*  GLUCOSE 74 84  BUN 42* 45*  CREATININE 2.19* 2.44*    Recent Labs  11/10/15 0250 11/10/15 0804  TROPONINI 0.81* 0.24*   Tele: Sinus rhythm  Echo: Study Conclusions  - Left ventricle: Mild inferior hypokinesis (mid) The cavity size  was normal. Wall thickness was normal. Systolic function was  normal. The estimated ejection fraction was in the range of 55%  to 60%. - Mitral valve: There was mild regurgitation. - Pulmonary arteries: PA peak pressure: 34 mm Hg (S). - Pericardium, extracardiac: Small pericardial effusion surrounds  heart. A small pericardial effusion was identified.  Assessment/Plan:  1. PEA arrest, intraoperative, now stable. 2. Mild troponin elevation 3. Reported intermittent chest pain 4. Anemia 5. Stroke, spinal infarct, multiple emboli 6. Acute on chronic  kidney disease/injury  Appears Stable this morning. Heart rhythm is stable. There are no ST changes on EKG or telemetry monitoring. Troponin minimally elevated. It does not appear that he had a major ischemic event as there were no EKG changes or arrhythmia at the time of his arrest. Troponin elevation is very mild considering his chronic kidney disease and cardiac arrest scenario. LV function is preserved on follow-up echo study. Will continue to follow and decide on risk/benefit of cardiac catheterization once he is extubated and recovered from his complicated illness.   Tonny Bollman, M.D. 11/10/2015, 12:25 PM

## 2015-11-10 NOTE — Consult Note (Signed)
WOC wound consult note Reason for Consult: Consult requested for new colostomy from surgery on 5/4.  Surgical team is following for full thickness wound to sacrum/buttocks after debridement in the OR; they have ordered Santyl for enzymatic debridement and physical therapy for hydrotherapy to assist with removal of nonviable tissue.  Pt is on a Sport low air loss bed.  Please refer to surgical team for further questions regarding this wound. WOC ostomy consult note Stoma type/location:  Colostomy surgery performed yesterday.  Stoma red and viable to RLQ; pouch is intact with good seal.   Output: Scant amt pink drainage, no stool or flatus at this time. Ostomy pouching: 2pc.  Education provided: Will begin ostomy teaching sessions when stable and out of ICU Enrolled patient in [facility] DC program: No Cammie Mcgee MSN, RN, [place], [place], [place] [phone]

## 2015-11-10 NOTE — Consult Note (Signed)
PULMONARY / CRITICAL CARE MEDICINE   Name: Shane Dougherty MRN: [id number] DOB: 19-May-1976    ADMISSION DATE:  11-06-15 CONSULTATION DATE:    REFERRING MD:  5/4  CHIEF COMPLAINT:  Post arrest    SUBJECTIVE:  PT reports impending hydrotherapy.  Remains on propofol, PRVC / sedate.     VITAL SIGNS: BP 108/72 mmHg  Pulse 84  Temp(Src) 97.4 F (36.3 C) (Axillary)  Resp 0  Ht  (1.702 m)  Wt 184 lb 1.4 oz (83.5 kg)  BMI 28.82 kg/m2  SpO2 100%  HEMODYNAMICS:    VENTILATOR SETTINGS: Vent Mode:  [-] PRVC FiO2 (%):  [40 %-50 %] 40 % Set Rate:  [12 bmp-14 bmp] 14 bmp Vt Set:  [600 mL] 600 mL PEEP:  [5 cmH20] 5 cmH20 Pressure Support:  [10 cmH20] 10 cmH20 Plateau Pressure:  [17 cmH20-24 cmH20] 19 cmH20  INTAKE / OUTPUT: I/O last 3 completed shifts: In: 3749 [I.V.:2704; Blood:685; Other:10; NG/GT:150; IV Piggyback:200] Out: 1155 [Urine:1055; Emesis/NG output:50; Blood:50]  PHYSICAL EXAMINATION: General:  Chronically ill appearing male, NAD on vent  Neuro:  Sedate, no response to verbal stimuli (followed commands post CPR 5/4) HEENT:  Mm moist, no JVD, ETT  Cardiovascular:  s1s2 rrr Lungs:  resps even non labored on vent, diminished bases Abdomen:  Round, soft, incision c/d, pink ostomy with min outpt  Musculoskeletal:  Warm and dry, scant BLE edema    LABS:  BMET  Recent Labs Lab 11/27/2015 0652 12/02/2015 1605 11/10/15 0400  NA 132* 136 136  K 4.1 3.7 4.0  CL 102 110 104  CO2 17* 15* 16*  BUN 44* 42* 45*  CREATININE 2.42* 2.19* 2.44*  GLUCOSE 103* 74 84    Electrolytes  Recent Labs Lab 11/28/2015 0652 11/10/2015 1605 11/10/15 0400  CALCIUM 7.7* 6.8* 7.6*  MG  --  1.8  --   PHOS 5.7* 5.7* 6.3*    CBC  Recent Labs Lab 11/08/15 2348 11/12/2015 1610 11/10/15 0400  WBC 48.1* 35.4* 29.4*  HGB 8.1* 7.1* 9.1*  HCT 25.1* 22.1* 27.5*  PLT 649* 476* 521*    Coag's  Recent Labs Lab 11/13/2015 2105  INR 1.27    Sepsis Markers  Recent Labs Lab  11/27/2015 1606 12/05/2015 1610 12/05/2015 2105 11/10/15 0400  LATICACIDVEN 0.9  --  0.8  --   PROCALCITON  --  3.15  --  4.49    ABG  Recent Labs Lab 11/17/2015 1458  PHART 7.376  PCO2ART 30.7*  PO2ART 182.0*    Liver Enzymes  Recent Labs Lab 11/17/2015 0652 11/23/2015 1605 11/10/15 0400  AST  --  31  --   ALT  --  7*  --   ALKPHOS  --  839*  --   BILITOT  --  0.6  --   ALBUMIN <1.0* <1.0* <1.0*    Cardiac Enzymes  Recent Labs Lab 11/29/2015 2105 11/10/15 0250 11/10/15 0804  TROPONINI 0.62* 0.81* 0.24*    Glucose  Recent Labs Lab 11/25/2015 1721 11/09/2015 1935 11/29/2015 2231 11/10/15 0002 11/10/15 0353 11/10/15 0824  GLUCAP 113* 87 73 81 84 85    Imaging Dg Chest Portable 1 View  11/21/2015  CLINICAL DATA:  Postop.  Intubated patient. EXAM: PORTABLE CHEST 1 VIEW COMPARISON:  11/08/2015 FINDINGS: Endotracheal tube tip projects 2.9 cm above the carina. Nasal/orogastric tube passes well below the diaphragm into stomach. Lung volumes are low. Lungs are clear. No convincing pleural effusion or pneumothorax on this semi-erect study. Normal heart,  mediastinum and hila. IMPRESSION: 1. Well-positioned endotracheal tube and nasal/orogastric tube. 2. No acute cardiopulmonary disease. Electronically Signed   By: Amie Portland M.D.   On: 11/25/2015 16:18     STUDIES:  4/25 MRI brain >> Multiple small foci of restricted diffusion in both cerebral hemispheres most consistent with embolic infarctions. Echo 4/24 >> EF 55-60%, grade 2 diastolic dysfunction, PA pressure , small pericardial effusion  UE Duplex 4/26  >> no evidence of DVT LE Duplex 4/26 >> no evidence of DVT Carotid Doppler 4/26 >> no extracranial carotid artery stenosis  TEE 4/27 >> EF 60-65%, no evidence of vegetation, trivial pericardial effusion 2D echo 5/4 >> LV mild inferior hypokinesis, nml size, EF 55-60%, mild MR, PA peak 34, small pericardial effusion  CULTURES: 4/23 CDiff >> neg  4/26 BCx2 >> neg   4/21 abscess >> mod group B strep  ANTIBIOTICS: Unasyn 4/28 >>  SIGNIFICANT EVENTS: 4/21  OR for I&D L gluteal abscess  5/02  OR for sacral debridement  5/04  OR for diverting colostomy >> PEA arrest   LINES/TUBES: ETT 5/4 >>  DISCUSSION: 40 y/o male with uncontrolled DM with complicated course r/t peri-rectal abscess, sepsis, Acute on chronic renal failure.  Had brief intra-operative brady/PEA arrest with ROSC after 2-3 mins.  Returned to ICU on vent.    ASSESSMENT / PLAN:  PULMONARY Acute respiratory failure -- post brief arrest  P:   Vent support - 8cc/kg  Wean PEEP / FiO2 for sats > 92% F/u CXR, repeat now  Intermittent ABG  SBT / WUA with goal of extubation  CARDIOVASCULAR PEA arrest - intra-op 5/4 Chest pain - intermittent during hospitalization  Small Pericardial Effusion Elevated troponin  HTN NSTEMI  P:  Trend EKG Repeat ECHO as above  Trend troponin  Cardiology following, appreciate input Cont BB/coreg, lipitor  RENAL Acute on CKD 3 - baseline Scr ~1.6-1.8, no hydro on renal US Hyponatremia  P:   Renal following  Trend BMP / UOP Replace electrolytes as indicated    GASTROINTESTINAL Diarrhea - ongoing.  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  Some ?chrons disease  Severe protein calorie malnutrition  P:   Will need ongoing GI f/u as outpt  Surgery following  NPO for now  Defer feeding to CCS PPI for SUP  HEMATOLOGIC Anemia  P:  F/u cbc  SCD's    INFECTIOUS Leukocytosis - persistently elevated wbc > 48 Peri-rectal abscess  P:   IV abx as above  Monitor fever curve / WBC Cultures as above  WOC / PT for hydrotherapy  ENDOCRINE Uncontrolled DM   P:   SSI  Hold lantus for now while NPO   NEUROLOGIC Profound lower extremity weakness -  due to probable anterior spinal infarct and multiple embolic cerebral  Embolic CVA  P:   RASS goal: -1  Propofol, PRN fent  Serial neuro exams Neurology previously following   FAMILY  - Updates: no family  available 5/5.    - Inter-disciplinary family meet or Palliative Care meeting due by:  Day 7     Canary Brim, NP-C [facility] Pgr: [phone] or if no answer [phone] 11/10/2015, 9:21 AM

## 2015-11-10 NOTE — Progress Notes (Signed)
1 Day Post-Op  Subjective: POD 1 s/p lap loop diverting sigmoid colostomy for fecal incontinence and sacral wound.  Complicated by post operative cardiac event requiring CPR and ICU management.  NAE overnight.  Cardiology consulted and trending troponins.  Mild increase noted this AM.  ECHO yesterday with Normal function and some mild hypokenesis noted.  Objective: Vital signs in last 24 hours: Temp:  [96.4 F (35.8 C)-97.7 F (36.5 C)] 97.5 F (36.4 C) (05/05 0400) Pulse Rate:  [75-94] 82 (05/05 0739) Resp:  [10-18] 14 (05/05 0739) BP: (94-148)/(66-81) 139/73 mmHg (05/05 0739) SpO2:  [98 %-100 %] 100 % (05/05 0739) Arterial Line BP: (119-151)/(62-81) 145/73 mmHg (05/05 0600) FiO2 (%):  [40 %-50 %] 40 % (05/05 0739) Weight:  [83.5 kg (184 lb 1.4 oz)] 83.5 kg (184 lb 1.4 oz) (05/05 0200) Last BM Date: 11/13/2015  Intake/Output from previous day: 05/04 0701 - 05/05 0700 In: 3414 [I.V.:2704; Blood:350; NG/GT:150; IV Piggyback:200] Out: 955 [Urine:855; Emesis/NG output:50; Blood:50] Intake/Output this shift:    General appearance: mild distress and Intubated. GI: Soft.  Nondistended.  Ostomy pink and viable in LLQ qith bowel sweat noted in appliance.  Incisions clean dry and intact without evidence of infection Incision/Wound: Sacral wound with continued evidence of pressure on superior aspect.  Base of wound with fibrinous material and some necrotic debris.  No bleeding.  Lab Results:   Recent Labs  11/29/2015 1610 11/10/15 0400  WBC 35.4* 29.4*  HGB 7.1* 9.1*  HCT 22.1* 27.5*  PLT 476* 521*   BMET  Recent Labs  11/26/2015 1605 11/10/15 0400  NA 136 136  K 3.7 4.0  CL 110 104  CO2 15* 16*  GLUCOSE 74 84  BUN 42* 45*  CREATININE 2.19* 2.44*  CALCIUM 6.8* 7.6*   PT/INR  Recent Labs  11/19/2015 2105  LABPROT 16.0*  INR 1.27   ABG  Recent Labs  11/19/2015 1458  PHART 7.376  HCO3 18.2*    Studies/Results: Dg Chest Portable 1 View  11/08/2015  CLINICAL DATA:   Postop.  Intubated patient. EXAM: PORTABLE CHEST 1 VIEW COMPARISON:  11/08/2015 FINDINGS: Endotracheal tube tip projects 2.9 cm above the carina. Nasal/orogastric tube passes well below the diaphragm into stomach. Lung volumes are low. Lungs are clear. No convincing pleural effusion or pneumothorax on this semi-erect study. Normal heart, mediastinum and hila. IMPRESSION: 1. Well-positioned endotracheal tube and nasal/orogastric tube. 2. No acute cardiopulmonary disease. Electronically Signed   By: Amie Portland M.D.   On: 11/26/2015 16:18   Dg Chest Port 1 View  11/08/2015  CLINICAL DATA:  40 year old with generalized chest pain associated with shortness of breath and cough which began earlier today. Current history of diabetes. EXAM: PORTABLE CHEST 1 VIEW COMPARISON:  10/29/2015 and earlier. FINDINGS: Markedly suboptimal inspiration accounts for atelectasis in the lung bases and accentuates the cardiac silhouette. Taking this into account, cardiac silhouette upper normal in size. Lungs otherwise clear. Bronchovascular markings normal. Pulmonary vascularity normal. No visible pleural effusions. IMPRESSION: Suboptimal inspiration accounts for bibasilar atelectasis. No acute cardiopulmonary disease otherwise. Electronically Signed   By: Hulan Saas M.D.   On: 11/08/2015 20:58    Anti-infectives: Anti-infectives    Start     Dose/Rate Route Frequency Ordered Stop   11/03/15 1200  Ampicillin-Sulbactam (UNASYN) 3 g in sodium chloride 0.9 % 100 mL IVPB     3 g 100 mL/hr over 60 Minutes Intravenous Every 8 hours 11/03/15 1104     10/31/15 1300  ampicillin-sulbactam (UNASYN) 1.5  g in sodium chloride 0.9 % 50 mL IVPB  Status:  Discontinued     1.5 g 100 mL/hr over 30 Minutes Intravenous Every 12 hours 10/31/15 1214 11/03/15 1104   10/29/15 2000  metroNIDAZOLE (FLAGYL) IVPB 500 mg  Status:  Discontinued     500 mg 100 mL/hr over 60 Minutes Intravenous Every 8 hours 10/29/15 1852 10/30/15 1312   10/29/15  0800  vancomycin (VANCOCIN) IVPB 750 mg/150 ml premix  Status:  Discontinued     750 mg 150 mL/hr over 60 Minutes Intravenous Every 24 hours 10/28/15 1058 10/31/15 0750   10/22/2015 0800  vancomycin (VANCOCIN) 500 mg in sodium chloride 0.9 % 100 mL IVPB  Status:  Discontinued     500 mg 100 mL/hr over 60 Minutes Intravenous Every 12 hours 10/13/2015 1931 10/28/15 1058   11/03/2015 0200  piperacillin-tazobactam (ZOSYN) IVPB 3.375 g  Status:  Discontinued     3.375 g 12.5 mL/hr over 240 Minutes Intravenous Every 8 hours 10/29/2015 1938 10/31/15 1205   10/14/2015 2000  piperacillin-tazobactam (ZOSYN) IVPB 3.375 g     3.375 g 100 mL/hr over 30 Minutes Intravenous  Once 11/03/2015 1903 10/21/2015 2117   10/29/2015 2000  vancomycin (VANCOCIN) IVPB 1000 mg/200 mL premix     1,000 mg 200 mL/hr over 60 Minutes Intravenous  Once 11/04/2015 1903 10/28/2015 2147      Assessment/Plan: s/p Procedure(s): LAPAROSCOPIC DIVERTED COLOSTOMY (N/A) Vent management per CCM. ECHO yesterday following CPR demonstrated normal EF with mild hypokenesis.  Cardiology input much appreciated. OK to start trickle tube feeds if no plan for extubation.  If planning for extubation, would hold on feeds.   Ostomy nurse consult for ostomy evaluation and teaching. Wound with some necrotic debris and may require additional debridement, however, in face of recent cardiac event, will attempt to manage posterior wound with wet to dry dressing changes.  Wound nurse consult placed.   LOS: 15 days    Concha Se 11/10/2015

## 2015-11-10 NOTE — Procedures (Signed)
Extubation Procedure Note  Patient Details:   Name: [name] DOB: Mar 03, 1976 MRN: [id number]   Airway Documentation:  Airway 8 mm (Active)  Secured at (cm) 23 cm 11/10/2015 10:47 AM  Measured From Lips 11/10/2015 10:47 AM  Secured Location Center 11/10/2015 10:47 AM  Secured By [employer] 11/10/2015 10:47 AM  Tube Holder Repositioned Yes 11/10/2015 10:47 AM  Cuff Pressure (cm H2O) 28 cm H2O 11/10/2015  4:00 AM  Site Condition Dry 11/10/2015 10:47 AM    Evaluation  O2 sats: stable throughout Complications: No apparent complications Patient did tolerate procedure well. Bilateral Breath Sounds: Clear, Diminished   Yes   Patient was extubated to a 4L [place]. RN was at bedside with RT during extubation. Cuff leak was heard. No stridor was noted. Patient was able to vocalize after extubation. RT will continue to monitor.  [name] 11/10/2015, 1:53 PM

## 2015-11-10 NOTE — Progress Notes (Signed)
E-link physician called about continued low CBGs, order for D5 NS ordered. Physician also aware of 2100 troponin results. No new orders received will continue to monitor.

## 2015-11-10 NOTE — Progress Notes (Signed)
Subjective:   Intubated Objective Vital signs in last 24 hours: Filed Vitals:   11/10/15 0500 11/10/15 0600 11/10/15 0739 11/10/15 0800  BP: 113/72 112/72 139/73 108/72  Pulse: 84 83 82 84  Temp:      TempSrc:      Resp: 18 14 14 0  Height:      Weight:      SpO2: 98% 100% 100% 100%   Weight change:   Intake/Output Summary (Last 24 hours) at 11/10/15 0838 Last data filed at 11/10/15 0800  Gross per 24 hour  Intake 3502.23 ml  Output   1105 ml  Net 2397.23 ml    Assessment/ Plan: Pt is a 39 y.o. yo male with uncontrolled DM who was admitted on 11/02/2015 with AoCKD3 in the setting of diarrhea and s/p PEA arrest in the OR for diverting colostomy placement.   Assessment/Plan: 1. AoCKD3 - baseline cr 1.8, now 2.4. Making good UOP. Continue IVF 125 cc/hr, trend renal function and UOP 2. Anemia- Hgb 9.1 post transfusion from hgb 7.1 3. HTN/volume- soft blood pressures, net + 14,000. Continue to trend I/O  Haney,Alyssa   Renal Attending: Events noted.  Will continue with IVF support.  Good oxygenation.  Very pos fluid balance.  , C    Labs: Basic Metabolic Panel:  Recent Labs Lab 11/22/2015 0652 11/27/2015 1605 11/10/15 0400  NA 132* 136 136  K 4.1 3.7 4.0  CL 102 110 104  CO2 17* 15* 16*  GLUCOSE 103* 74 84  BUN 44* 42* 45*  CREATININE 2.42* 2.19* 2.44*  CALCIUM 7.7* 6.8* 7.6*  PHOS 5.7* 5.7* 6.3*   Liver Function Tests:  Recent Labs Lab  0652  1605 11/10/15 0400  AST  --  31  --   ALT  --  7*  --   ALKPHOS  --  839*  --   BILITOT  --  0.6  --   PROT  --  5.4*  --   ALBUMIN <1.0* <1.0* <1.0*   No results for input(s): LIPASE, AMYLASE in the last 168 hours. No results for input(s): AMMONIA in the last 168 hours. CBC:  Recent Labs Lab 11/06/15 0631 11/08/15 1001 11/08/15 2348 11/16/2015 1610 11/10/15 0400  WBC 43.3* 43.4* 48.1* 35.4* 29.4*  NEUTROABS  --   --   --  33.6*  --   HGB 7.4* 7.2* 8.1* 7.1* 9.1*  HCT 22.3*  22.1* 25.1* 22.1* 27.5*  MCV 80.5 81.9 83.1 81.5 79.7  PLT 575* 620* 649* 476* 521*   Cardiac Enzymes:  Recent Labs Lab 11/08/15 2348 12/02/2015 0652 11/17/2015 1605 11/28/2015 2105 11/10/15 0250  TROPONINI 0.20* 0.40* 0.24* 0.62* 0.81*   CBG:  Recent Labs Lab 11/08/2015 1935 11/11/2015 2231 11/10/15 0002 11/10/15 0353 11/10/15 0824  GLUCAP 87 73 81 84 85    Iron Studies: No results for input(s): IRON, TIBC, TRANSFERRIN, FERRITIN in the last 72 hours. Studies/Results: Dg Chest Portable 1 View  11/13/2015  CLINICAL DATA:  Postop.  Intubated patient. EXAM: PORTABLE CHEST 1 VIEW COMPARISON:  11/08/2015 FINDINGS: Endotracheal tube tip projects 2.9 cm above the carina. Nasal/orogastric tube passes well below the diaphragm into stomach. Lung volumes are low. Lungs are clear. No convincing pleural effusion or pneumothorax on this semi-erect study. Normal heart, mediastinum and hila. IMPRESSION: 1. Well-positioned endotracheal tube and nasal/orogastric tube. 2. No acute cardiopulmonary disease. Electronically Signed   By: David  Ormond M.D.   On: 11/18/2015 16:18   Dg Chest Port 1 View    11/08/2015  CLINICAL DATA:  40 year old with generalized chest pain associated with shortness of breath and cough which began earlier today. Current history of diabetes. EXAM: PORTABLE CHEST 1 VIEW COMPARISON:  10/29/2015 and earlier. FINDINGS: Markedly suboptimal inspiration accounts for atelectasis in the lung bases and accentuates the cardiac silhouette. Taking this into account, cardiac silhouette upper normal in size. Lungs otherwise clear. Bronchovascular markings normal. Pulmonary vascularity normal. No visible pleural effusions. IMPRESSION: Suboptimal inspiration accounts for bibasilar atelectasis. No acute cardiopulmonary disease otherwise. Electronically Signed   By: Evangeline Dakin M.D.   On: 11/08/2015 20:58   Medications: Infusions: . sodium chloride Stopped (11/15/2015 1516)  . dextrose 5 % and 0.9%  NaCl 50 mL/hr at 11/10/15 0800  . propofol (DIPRIVAN) infusion 25.087 mcg/kg/min (11/10/15 0800)    Scheduled Medications: . sodium chloride   Intravenous Once  . ampicillin-sulbactam (UNASYN) IV  3 g Intravenous Q8H  . antiseptic oral rinse  7 mL Mouth Rinse 10 times per day  . aspirin  325 mg Oral QHS  . atorvastatin  80 mg Oral q1800  . carvedilol  6.25 mg Oral BID WC  . chlorhexidine gluconate (SAGE KIT)  15 mL Mouth Rinse BID  . cholecalciferol  2,000 Units Oral Daily  . collagenase   Topical Daily  . darbepoetin (ARANESP) injection - NON-DIALYSIS  60 mcg Subcutaneous Q14 Days  . docusate sodium  100 mg Oral BID  . heparin subcutaneous  5,000 Units Subcutaneous Q8H  . insulin aspart  0-15 Units Subcutaneous Q4H  . multivitamin with minerals  1 tablet Oral Daily  . pantoprazole (PROTONIX) IV  40 mg Intravenous Q24H  . thiamine  100 mg Oral Daily    have reviewed scheduled and prn medications.  Physical Exam: General: NAD Heart: RRR Lungs: CTAB Abdomen: soft, colostomy in place Extremities: no LE edema    11/10/2015,8:38 AM  LOS: 15 days

## 2015-11-10 NOTE — Progress Notes (Signed)
PT Cancellation Note  Patient Details Name: Shane Dougherty MRN: [id number] DOB: 1975-09-25   Cancelled Treatment:    Reason Eval/Treat Not Completed: Patient not medically ready.  Noted events yesterday and Troponins trending up.  Will hold PT and mobility at this time and f/u as appropriate.     Sunny Schlein, Brent [phone] 11/10/2015, 8:44 AM

## 2015-11-10 NOTE — Progress Notes (Signed)
Physical Therapy Wound Treatment Patient Details  Name: Shane Dougherty MRN: [id number] Date of Birth: 1975-09-25  Today's Date: 11/10/2015 Time: 0900-0953 Time Calculation (min): 53 min  Subjective  Subjective: Pt nonverbal throughout session Patient and Family Stated Goals: Heal wound - get back to work Date of Onset: 10/14/15 (Approx per pt when he noticed the abscess for the first time) Prior Treatments: I&D 11/23/2015, Colostomy 11/20/2015  Pain Score:   Pt did not appear to be in distress during session. Intubated and lethargic.  Wound Assessment  Wound / Incision (Open or Dehisced) 11/08/15 Incision - Open Sacrum Mid (Active)  Dressing Type ABD;Barrier Film (skin prep);Gauze (Comment);Moist to dry 11/10/2015  1:44 PM  Dressing Changed Changed 11/10/2015  1:44 PM  Dressing Status Clean;Dry;Intact 11/10/2015  1:44 PM  Dressing Change Frequency Daily 11/10/2015  1:44 PM  Site / Wound Assessment Bleeding;Yellow;Brown;Red 11/10/2015  1:44 PM  % Wound base Red or Granulating 10% 11/10/2015  1:44 PM  % Wound base Yellow 80% 11/10/2015  1:44 PM  % Wound base Black 10% 11/10/2015  1:44 PM  Peri-wound Assessment Intact;Induration 11/10/2015  1:44 PM  Wound Length (cm) 16.5 cm 11/08/2015  2:37 PM  Wound Width (cm) 9.2 cm 11/08/2015  2:37 PM  Wound Depth (cm) 3.2 cm 11/08/2015  2:37 PM  Undermining (cm) 2.6 at 10 o'clock, 1.5 at 3 o'clock, 1.6 at 4 o'clock 11/08/2015  2:37 PM  Margins Unattached edges (unapproximated) 11/10/2015  1:44 PM  Closure None 11/10/2015  1:44 PM  Drainage Amount Minimal 11/10/2015  1:44 PM  Drainage Description Sanguineous 11/10/2015  1:44 PM  Treatment Debridement (Selective);Hydrotherapy (Pulse lavage);Packing (Saline gauze) 11/10/2015  1:44 PM  Santyl applied to wound bed prior to applying dressing.   Hydrotherapy Pulsed lavage therapy - wound location: sacrum Pulsed Lavage with Suction (psi): 12 psi Pulsed Lavage with Suction - Normal Saline Used: 1000 mL Pulsed Lavage Tip: Tip with splash  shield Selective Debridement Selective Debridement - Location: sacrum Selective Debridement - Tools Used: Forceps;Scissors Selective Debridement - Tissue Removed: Yellow and brown necrotic tissue   Wound Assessment and Plan  Wound Therapy - Assess/Plan/Recommendations Wound Therapy - Clinical Statement: Pt will benefit from continued hydrotherapy to continue selective removal of necrotic tissue and decrease bioburden.  Wound Therapy - Functional Problem List: Decreased tolerance of OOB Factors Delaying/Impairing Wound Healing: Diabetes Mellitus;Incontinence Hydrotherapy Plan: Debridement;Dressing change;Pulsatile lavage with suction;Patient/family education Wound Therapy - Frequency: 6X / week Wound Therapy - Follow Up Recommendations: Other (comment) (CIR) Wound Plan: See above  Wound Therapy Goals- Improve the function of patient's integumentary system by progressing the wound(s) through the phases of wound healing (inflammation - proliferation - remodeling) by: Decrease Necrotic Tissue to: 25% Decrease Necrotic Tissue - Progress: Progressing toward goal Increase Granulation Tissue to: 75% Increase Granulation Tissue - Progress: Progressing toward goal Goals/treatment plan/discharge plan were made with and agreed upon by patient/family: Yes Time For Goal Achievement: 7 days Wound Therapy - Potential for Goals: Good  Goals will be updated until maximal potential achieved or discharge criteria met.  Discharge criteria: when goals achieved, discharge from hospital, MD decision/surgical intervention, no progress towards goals, refusal/missing three consecutive treatments without notification or medical reason.  GP     Rolinda Roan 11/10/2015, 1:58 PM   Rolinda Roan, PT, DPT Acute Rehabilitation Services Pager: [phone]

## 2015-11-11 ENCOUNTER — Inpatient Hospital Stay (HOSPITAL_COMMUNITY): Payer: Managed Care, Other (non HMO)

## 2015-11-11 DIAGNOSIS — I469 Cardiac arrest, cause unspecified: Secondary | ICD-10-CM

## 2015-11-11 DIAGNOSIS — J9600 Acute respiratory failure, unspecified whether with hypoxia or hypercapnia: Secondary | ICD-10-CM

## 2015-11-11 DIAGNOSIS — R0789 Other chest pain: Secondary | ICD-10-CM

## 2015-11-11 LAB — GLUCOSE, CAPILLARY
GLUCOSE-CAPILLARY: 132 mg/dL — AB (ref 65–99)
GLUCOSE-CAPILLARY: 117 mg/dL — AB (ref 65–99)
GLUCOSE-CAPILLARY: 227 mg/dL — AB (ref 65–99)
Glucose-Capillary: 162 mg/dL — ABNORMAL HIGH (ref 65–99)
Glucose-Capillary: 193 mg/dL — ABNORMAL HIGH (ref 65–99)

## 2015-11-11 LAB — RENAL FUNCTION PANEL
Anion gap: 17 — ABNORMAL HIGH (ref 5–15)
BUN: 45 mg/dL — AB (ref 6–20)
CALCIUM: 7.3 mg/dL — AB (ref 8.9–10.3)
CO2: 15 mmol/L — ABNORMAL LOW (ref 22–32)
Chloride: 105 mmol/L (ref 101–111)
Creatinine, Ser: 2.34 mg/dL — ABNORMAL HIGH (ref 0.61–1.24)
GFR calc Af Amer: 38 mL/min — ABNORMAL LOW (ref >60)
GFR, EST NON AFRICAN AMERICAN: 33 mL/min — AB (ref >60)
GLUCOSE: 103 mg/dL — AB (ref 65–99)
PHOSPHORUS: 6.7 mg/dL — AB (ref 2.5–4.6)
POTASSIUM: 3.9 mmol/L (ref 3.5–5.1)
SODIUM: 137 mmol/L (ref 135–145)

## 2015-11-11 LAB — CBC
HEMATOCRIT: 26.1 % — AB (ref 39.0–52.0)
Hemoglobin: 8.4 g/dL — ABNORMAL LOW (ref 13.0–17.0)
MCH: 26.2 pg (ref 26.0–34.0)
MCHC: 32.2 g/dL (ref 30.0–36.0)
MCV: 81.3 fL (ref 78.0–100.0)
PLATELETS: 484 10*3/uL — AB (ref 150–400)
RBC: 3.21 MIL/uL — ABNORMAL LOW (ref 4.22–5.81)
RDW: 15.9 % — AB (ref 11.5–15.5)
WBC: 32.4 10*3/uL — AB (ref 4.0–10.5)

## 2015-11-11 LAB — TROPONIN I
TROPONIN I: 0.11 ng/mL — AB (ref <0.031)
Troponin I: 1.43 ng/mL (ref <0.031)
Troponin I: 0.15 ng/mL — ABNORMAL HIGH (ref <0.031)
Troponin I: 0.26 ng/mL — ABNORMAL HIGH (ref <0.031)

## 2015-11-11 LAB — MAGNESIUM: Magnesium: 2 mg/dL (ref 1.7–2.4)

## 2015-11-11 LAB — PROCALCITONIN: PROCALCITONIN: 3.67 ng/mL

## 2015-11-11 MED ORDER — BOOST / RESOURCE BREEZE PO LIQD
1.0000 | Freq: Three times a day (TID) | ORAL | Status: DC
  Administered 2015-11-11 – 2015-11-13 (×7): 1 via ORAL

## 2015-11-11 MED ORDER — FENTANYL CITRATE (PF) 100 MCG/2ML IJ SOLN
12.5000 ug | INTRAMUSCULAR | Status: DC | PRN
  Administered 2015-11-12 – 2015-11-14 (×7): 12.5 ug via INTRAVENOUS
  Filled 2015-11-11 (×7): qty 2

## 2015-11-11 MED ORDER — DEXTROSE 5 % IV SOLN
100.0000 mg | Freq: Four times a day (QID) | INTRAVENOUS | Status: AC
  Administered 2015-11-11 (×2): 100 mg via INTRAVENOUS
  Filled 2015-11-11 (×2): qty 10

## 2015-11-11 NOTE — Procedures (Signed)
850 VC, -50 NIF , good pt effort . RT will continue to monitor.

## 2015-11-11 NOTE — Progress Notes (Signed)
NIF -40, VC around 600 with decent pt effort.  Pt had some difficulty with mouthpiece and achieving a good seal.  RT will continue to monitor.  Vital signs stable, sats 99% on 2L 

## 2015-11-11 NOTE — Progress Notes (Signed)
Physical Therapy Wound Treatment Patient Details  Name: Shane Dougherty MRN: [id number] Date of Birth: 09-20-1975  Today's Date: 11/11/2015 Time: 2025-4270 Time Calculation (min): 32 min  Subjective  Subjective: Patient tearful - I just got comfortable.  Agreed to hydrotherapy with encouragement. Patient and Family Stated Goals: Heal wound - get back to work Date of Onset: 10/14/15 (Approx per pt when he noticed the abscess for the first time) Prior Treatments: I&D 11/10/2015, Colostomy 11/29/2015  Pain Score: Pain Score: 7 - RN premedicated  Wound Assessment  Wound / Incision (Open or Dehisced) 11/08/15 Incision - Open Sacrum Mid (Active)  Dressing Type ABD;Barrier Film (skin prep);Gauze (Comment);Moist to dry 11/11/2015 12:21 PM  Dressing Changed Changed 11/11/2015 12:21 PM  Dressing Status Clean;Dry;Intact 11/11/2015 12:21 PM  Dressing Change Frequency Daily 11/11/2015 12:21 PM  Site / Wound Assessment Bleeding;Yellow;Brown;Red 11/11/2015 12:21 PM  % Wound base Red or Granulating 10% 11/11/2015 12:21 PM  % Wound base Yellow 80% 11/11/2015 12:21 PM  % Wound base Black 10% 11/11/2015 12:21 PM  Peri-wound Assessment Intact;Induration 11/11/2015 12:21 PM  Margins Unattached edges (unapproximated) 11/11/2015 12:21 PM  Closure None 11/11/2015 12:21 PM  Drainage Amount Minimal 11/11/2015 12:21 PM  Drainage Description Sanguineous 11/11/2015 12:21 PM  Treatment Debridement (Selective);Hydrotherapy (Pulse lavage);Packing (Saline gauze) 11/11/2015 12:21 PM      Hydrotherapy Pulsed lavage therapy - wound location: sacrum Pulsed Lavage with Suction (psi): 12 psi (8-12) Pulsed Lavage with Suction - Normal Saline Used: 1000 mL Pulsed Lavage Tip: Tip with splash shield Selective Debridement Selective Debridement - Location: sacrum Selective Debridement - Tools Used: Forceps;Scissors Selective Debridement - Tissue Removed: Yellow and brown necrotic tissue   Wound Assessment and Plan  Wound Therapy -  Assess/Plan/Recommendations Wound Therapy - Clinical Statement: Pt will benefit from continued hydrotherapy to continue selective removal of necrotic tissue and decrease bioburden.  Wound Therapy - Functional Problem List: Decreased tolerance of OOB Factors Delaying/Impairing Wound Healing: Diabetes Mellitus;Incontinence Hydrotherapy Plan: Debridement;Dressing change;Pulsatile lavage with suction;Patient/family education Wound Therapy - Frequency: 6X / week Wound Therapy - Follow Up Recommendations: Other (comment) (CIR) Wound Plan: See above  Wound Therapy Goals- Improve the function of patient's integumentary system by progressing the wound(s) through the phases of wound healing (inflammation - proliferation - remodeling) by: Decrease Necrotic Tissue to: 25% Decrease Necrotic Tissue - Progress: Progressing toward goal Increase Granulation Tissue to: 75% Increase Granulation Tissue - Progress: Progressing toward goal Goals/treatment plan/discharge plan were made with and agreed upon by patient/family: Yes Time For Goal Achievement: 7 days Wound Therapy - Potential for Goals: Good  Goals will be updated until maximal potential achieved or discharge criteria met.  Discharge criteria: when goals achieved, discharge from hospital, MD decision/surgical intervention, no progress towards goals, refusal/missing three consecutive treatments without notification or medical reason.  GP     Despina Pole 11/11/2015, 12:25 PM Carita Pian. Sanjuana Kava, [place] Pager [phone]

## 2015-11-11 NOTE — Progress Notes (Signed)
CRITICAL VALUE ALERT  Critical value received:  Troponin 1.43  Date of notification:  11/11/2015  Time of notification:  0605  Critical value read back:Yes.    Nurse who received alert:  Marylu Lund, RN  MD notified (1st page):  Dr. Darrick Penna  Time of first page:  0622  MD notified (2nd page):  Time of second page:  Responding MD:  Dr. Darrick Penna  Time MD responded:  [phone]

## 2015-11-11 NOTE — Progress Notes (Signed)
PULMONARY / CRITICAL CARE MEDICINE   Name: Shane Dougherty MRN: [id number] DOB: Mar 27, 1976    ADMISSION DATE:  10/23/2015 CONSULTATION DATE:    REFERRING MD:  5/4  CHIEF COMPLAINT:  Post arrest    SUBJECTIVE: extubated 5/5. Awake. No pain.    VITAL SIGNS: BP 104/59 mmHg  Pulse 85  Temp(Src) 97.5 F (36.4 C) (Oral)  Resp 14  Ht  (1.702 m)  Wt 188 lb 0.8 oz (85.3 kg)  BMI 29.45 kg/m2  SpO2 100%  HEMODYNAMICS:    VENTILATOR SETTINGS: Vent Mode:  [-] PSV;CPAP FiO2 (%):  [30 %-40 %] 30 % PEEP:  [5 cmH20] 5 cmH20 Pressure Support:  [5 cmH20] 5 cmH20  INTAKE / OUTPUT:  Intake/Output Summary (Last 24 hours) at 11/11/15 0931 Last data filed at 11/11/15 4098  Gross per 24 hour  Intake 2676.1 ml  Output    770 ml  Net 1906.1 ml    PHYSICAL EXAMINATION: General:  Chronically ill appearing male, NAD lying in bed Neuro:  Awake, cooperative. Follows commands. Oriented. Extremely weak LEs.  HEENT:  Mm moist, no JVD Cardiovascular:  s1s2 rrr no MRG Lungs:  resps even non labored, diminished bases Abdomen:  Round, soft, incision c/d, pink ostomy with min outpt  Musculoskeletal:  Warm and dry, scant BLE edema    LABS:  BMET  Recent Labs Lab 11/06/2015 1605 11/10/15 0400 11/11/15 0405  NA 136 136 137  K 3.7 4.0 3.9  CL 110 104 105  CO2 15* 16* 15*  BUN 42* 45* 45*  CREATININE 2.19* 2.44* 2.34*  GLUCOSE 74 84 103*    Electrolytes  Recent Labs Lab 11/30/2015 1605 11/10/15 0400 11/11/15 0405  CALCIUM 6.8* 7.6* 7.3*  MG 1.8  --  2.0  PHOS 5.7* 6.3* 6.7*    CBC  Recent Labs Lab 11/29/2015 1610 11/10/15 0400 11/11/15 0405  WBC 35.4* 29.4* 32.4*  HGB 7.1* 9.1* 8.4*  HCT 22.1* 27.5* 26.1*  PLT 476* 521* 484*    Coag's  Recent Labs Lab 11/16/2015 2105  INR 1.27    Sepsis Markers  Recent Labs Lab 11/30/2015 1606 11/27/2015 1610 11/23/2015 2105 11/10/15 0400 11/11/15 0405  LATICACIDVEN 0.9  --  0.8  --   --   PROCALCITON  --  3.15  --  4.49  3.67    ABG  Recent Labs Lab 11/22/2015 1458 11/10/15 1005  PHART 7.376 7.439  PCO2ART 30.7* 26.0*  PO2ART 182.0* 161.0*    Liver Enzymes  Recent Labs Lab 11/19/2015 1605 11/10/15 0400 11/11/15 0405  AST 31  --   --   ALT 7*  --   --   ALKPHOS 839*  --   --   BILITOT 0.6  --   --   ALBUMIN <1.0* <1.0* <1.0*    Cardiac Enzymes  Recent Labs Lab 11/10/15 1345 11/10/15 2113 11/11/15 0405  TROPONINI 0.23* 0.18* 1.43*    Glucose  Recent Labs Lab 11/10/15 1234 11/10/15 1623 11/10/15 1954 11/10/15 2314 11/11/15 0351 11/11/15 0800  GLUCAP 96 111* 110* 125* 132* 117*    Imaging Dg Chest Port 1 View  11/11/2015  CLINICAL DATA:  Shortness of Breath EXAM: PORTABLE CHEST 1 VIEW COMPARISON:  Nov 09, 2015 FINDINGS: Endotracheal tube and nasogastric tube have been removed. No pneumothorax. There is now layering effusion on the right with mild underlying interstitial edema. No airspace consolidation is appreciable. Heart is upper normal in size with pulmonary vascularity within normal limits. No adenopathy apparent. IMPRESSION:  No pneumothorax. Layering effusion on the right with mild interstitial edema. Stable cardiac silhouette. Electronically Signed   By: Bretta Bang III M.D.   On: 11/11/2015 08:09     STUDIES:  4/25 MRI brain >> Multiple small foci of restricted diffusion in both cerebral hemispheres most consistent with embolic infarctions. Echo 4/24 >> EF 55-60%, grade 2 diastolic dysfunction, PA pressure , small pericardial effusion  UE Duplex 4/26  >> no evidence of DVT LE Duplex 4/26 >> no evidence of DVT Carotid Doppler 4/26 >> no extracranial carotid artery stenosis  TEE 4/27 >> EF 60-65%, no evidence of vegetation, trivial pericardial effusion 2D echo 5/4 >> LV mild inferior hypokinesis, nml size, EF 55-60%, mild MR, PA peak 34, small pericardial effusion  CULTURES: 4/23 CDiff >> neg  4/26 BCx2 >> neg  4/21 abscess >> mod group B  strep  ANTIBIOTICS: Unasyn 4/28 >>  SIGNIFICANT EVENTS: 4/21  OR for I&D L gluteal abscess  5/02  OR for sacral debridement  5/04  OR for diverting colostomy >> PEA arrest   LINES/TUBES: ETT 5/4 >>5/5  DISCUSSION: 40 y/o male with uncontrolled DM with complicated course r/t peri-rectal abscess, sepsis, Acute on chronic renal failure and 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 Hypoperfusion??.  Had brief intra-operative brady/PEA arrest with ROSC after 2-3 mins.  Returned to ICU on vent on 5/4. Extubated 5/5. Now awake and no sig c/o. Will adv activity. Cont PT/OT, abx and defer diet to surgical services. Ready for xfer to SDU. Will likely need SNF. Will ask triad to re-assume care   ASSESSMENT / PLAN:  PULMONARY Acute respiratory failure -- post brief arrest ->now resolved Effusion rt P:   Trend pulse ox Mobilize  Aspiration precautions   CARDIOVASCULAR PEA arrest - intra-op 5/4 Chest pain - intermittent during hospitalization  Small Pericardial Effusion Elevated troponin  HTN NSTEMI  P:  Trend troponin  Cardiology following, appreciate input Cont BB/coreg, lipitor  RENAL Acute on CKD 3 - baseline Scr ~1.6-1.8, no hydro on renal US AGMA w/ nml LA P:   Renal following and managing IVFs Trend BMP / UOP Replace electrolytes as indicated    GASTROINTESTINAL Diarrhea - ongoing.  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  Some ?chrons disease  Severe protein calorie malnutrition  P:   Will need ongoing GI f/u as outpt  Surgery following  NPO for now except meds Defer feeding to CCS-->likey clears  PPI for SUP  HEMATOLOGIC Anemia  P:  F/u cbc  SCD's    INFECTIOUS Leukocytosis - persistently elevated wbc  Peri-rectal abscess  P:   IV abx as above  Monitor fever curve / WBC Cultures as above  WOC / PT for hydrotherapy  ENDOCRINE Uncontrolled DM   P:   SSI  Hold lantus for now while NPO   NEUROLOGIC Profound lower extremity  weakness -  due to probable anterior spinal infarct and multiple embolic cerebral  Embolic CVA  P:   RASS goal: 0  Serial neuro exams Neurology previously following PT consult   FAMILY  - Updates: no family available 5/5.    - Inter-disciplinary family meet or Palliative Care meeting due by:  Day 7   Simonne Martinet ACNP-BC [facility] Pulmonary/Critical Care Pager # [phone] OR # [phone] if no answer  11/11/2015, 9:03 AM  STAFF NOTE: I, Rory Percy, MD FACP have personally reviewed patient's available data, including medical history, events of note, physical examination and test results as part  of my evaluation. I have discussed with resident/NP and other care providers such as pharmacist, RN and RRT. In addition, I personally evaluated patient and elicited key findings of: awake, no PAin, no distress, , has rt effusion and increased edema, would favor neg balance, no role repeat trop, cards to assess for cath, IS, ABX, pct down, WBC noted, follow trend wbc, mobilize, pt To traid, keep on monitor  [name]. Tyson Alias, MD, FACP Pgr: [phone] [place] [facility] 11/11/2015 12:25 PM

## 2015-11-11 NOTE — Progress Notes (Signed)
2 Days Post-Op  Subjective: Hungry. Not much pain. Denies n/v.   Objective: Vital signs in last 24 hours: Temp:  [97.2 F (36.2 C)-98.7 F (37.1 C)] 97.5 F (36.4 C) (05/06 0803) Pulse Rate:  [78-87] 86 (05/06 1000) Resp:  [0-23] 14 (05/06 1000) BP: (99-132)/(59-77) 126/77 mmHg (05/06 1000) SpO2:  [98 %-100 %] 99 % (05/06 1000) Arterial Line BP: (109-155)/(52-80) 142/64 mmHg (05/06 1000) FiO2 (%):  [30 %-40 %] 30 % (05/05 1047) Weight:  [85.3 kg (188 lb 0.8 oz)] 85.3 kg (188 lb 0.8 oz) (05/06 0500) Last BM Date: 11/10/15  Intake/Output from previous day: 05/05 0701 - 05/06 0700 In: 3015.2 [I.V.:2835.2; NG/GT:80; IV Piggyback:100] Out: 920 [Urine:920] Intake/Output this shift: Total I/O In: 375 [I.V.:375] Out: 200 [Urine:200]  Awake, alert, approp Soft, Full abdomen but ostomy viable, air in bag, no stool  Scrotal edema  Lab Results:   Recent Labs  11/10/15 0400 11/11/15 0405  WBC 29.4* 32.4*  HGB 9.1* 8.4*  HCT 27.5* 26.1*  PLT 521* 484*   BMET  Recent Labs  11/10/15 0400 11/11/15 0405  NA 136 137  K 4.0 3.9  CL 104 105  CO2 16* 15*  GLUCOSE 84 103*  BUN 45* 45*  CREATININE 2.44* 2.34*  CALCIUM 7.6* 7.3*   PT/INR  Recent Labs  11/26/2015 2105  LABPROT 16.0*  INR 1.27   ABG  Recent Labs  11/24/2015 1458 11/10/15 1005  PHART 7.376 7.439  HCO3 18.2* 17.6*    Studies/Results: Dg Chest Port 1 View  11/11/2015  CLINICAL DATA:  Shortness of Breath EXAM: PORTABLE CHEST 1 VIEW COMPARISON:  Nov 09, 2015 FINDINGS: Endotracheal tube and nasogastric tube have been removed. No pneumothorax. There is now layering effusion on the right with mild underlying interstitial edema. No airspace consolidation is appreciable. Heart is upper normal in size with pulmonary vascularity within normal limits. No adenopathy apparent. IMPRESSION: No pneumothorax. Layering effusion on the right with mild interstitial edema. Stable cardiac silhouette. Electronically Signed   By:  Bretta Bang III M.D.   On: 11/11/2015 08:09   Dg Chest Portable 1 View  11/28/2015  CLINICAL DATA:  Postop.  Intubated patient. EXAM: PORTABLE CHEST 1 VIEW COMPARISON:  11/08/2015 FINDINGS: Endotracheal tube tip projects 2.9 cm above the carina. Nasal/orogastric tube passes well below the diaphragm into stomach. Lung volumes are low. Lungs are clear. No convincing pleural effusion or pneumothorax on this semi-erect study. Normal heart, mediastinum and hila. IMPRESSION: 1. Well-positioned endotracheal tube and nasal/orogastric tube. 2. No acute cardiopulmonary disease. Electronically Signed   By: Amie Portland M.D.   On: 11/13/2015 16:18    Anti-infectives: Anti-infectives    Start     Dose/Rate Route Frequency Ordered Stop   11/03/15 1200  Ampicillin-Sulbactam (UNASYN) 3 g in sodium chloride 0.9 % 100 mL IVPB     3 g 100 mL/hr over 60 Minutes Intravenous Every 8 hours 11/03/15 1104     10/31/15 1300  ampicillin-sulbactam (UNASYN) 1.5 g in sodium chloride 0.9 % 50 mL IVPB  Status:  Discontinued     1.5 g 100 mL/hr over 30 Minutes Intravenous Every 12 hours 10/31/15 1214 11/03/15 1104   10/29/15 2000  metroNIDAZOLE (FLAGYL) IVPB 500 mg  Status:  Discontinued     500 mg 100 mL/hr over 60 Minutes Intravenous Every 8 hours 10/29/15 1852 10/30/15 1312   10/29/15 0800  vancomycin (VANCOCIN) IVPB 750 mg/150 ml premix  Status:  Discontinued     750 mg 150  mL/hr over 60 Minutes Intravenous Every 24 hours 10/28/15 1058 10/31/15 0750   10/16/2015 0800  vancomycin (VANCOCIN) 500 mg in sodium chloride 0.9 % 100 mL IVPB  Status:  Discontinued     500 mg 100 mL/hr over 60 Minutes Intravenous Every 12 hours 11/01/2015 1931 10/28/15 1058   10/29/2015 0200  piperacillin-tazobactam (ZOSYN) IVPB 3.375 g  Status:  Discontinued     3.375 g 12.5 mL/hr over 240 Minutes Intravenous Every 8 hours 10/25/2015 1938 10/31/15 1205   10/25/2015 2000  piperacillin-tazobactam (ZOSYN) IVPB 3.375 g     3.375 g 100 mL/hr over 30  Minutes Intravenous  Once 11/05/2015 1903 10/12/2015 2117   11/03/2015 2000  vancomycin (VANCOCIN) IVPB 1000 mg/200 mL premix     1,000 mg 200 mL/hr over 60 Minutes Intravenous  Once 10/18/2015 1903 10/15/2015 2147      Assessment/Plan: s/p Procedure(s): LAPAROSCOPIC DIVERTED COLOSTOMY (N/A)  Start clears + boost/ensure Mobilize If tolerates clears, will need to evaluate IVF on Sunday Cont chemical vte prophylaxis pulm toilet  Mary Sella. Andrey Campanile, MD, FACS General, Bariatric, & Minimally Invasive Surgery [facility], [place]   LOS: 16 days    Atilano Ina 11/11/2015

## 2015-11-11 NOTE — Progress Notes (Signed)
Patient ID: Shane Dougherty, male   DOB: 12-Dec-1975, 40 y.o.   MRN: [id number]     Subjective:   Tired this AM. No chest pain, no SOB  Objective:   Temp:  [97.2 F (36.2 C)-98.7 F (37.1 C)] 97.5 F (36.4 C) (05/06 0803) Pulse Rate:  [79-87] 80 (05/06 1100) Resp:  [0-23] 0 (05/06 1100) BP: (99-126)/(59-77) 102/67 mmHg (05/06 1100) SpO2:  [90 %-100 %] 91 % (05/06 1115) Arterial Line BP: (109-155)/(52-80) 142/64 mmHg (05/06 1000) Weight:  [188 lb 0.8 oz (85.3 kg)] 188 lb 0.8 oz (85.3 kg) (05/06 0500) Last BM Date: 11/10/15  Filed Weights   10/14/2015 1800 11/10/15 0200 11/11/15 0500  Weight: 148 lb (67.132 kg) 184 lb 1.4 oz (83.5 kg) 188 lb 0.8 oz (85.3 kg)    Intake/Output Summary (Last 24 hours) at 11/11/15 1152 Last data filed at 11/11/15 1100  Gross per 24 hour  Intake 3065.83 ml  Output    920 ml  Net 2145.83 ml    Telemetry: SR  Exam:  General: NAD  HEENT: sclera clear, throat clear  Resp: CTAB  Cardiac: RRR, no m/r/g ,no jvd  GI: abdomen soft, NT, ND  MSK: 1+ bilateral edema   Psych: appropriate affect  Lab Results:  Basic Metabolic Panel:  Recent Labs Lab 11/24/2015 1605 11/10/15 0400 11/11/15 0405  NA 136 136 137  K 3.7 4.0 3.9  CL 110 104 105  CO2 15* 16* 15*  GLUCOSE 74 84 103*  BUN 42* 45* 45*  CREATININE 2.19* 2.44* 2.34*  CALCIUM 6.8* 7.6* 7.3*  MG 1.8  --  2.0    Liver Function Tests:  Recent Labs Lab 11/29/2015 1605 11/10/15 0400 11/11/15 0405  AST 31  --   --   ALT 7*  --   --   ALKPHOS 839*  --   --   BILITOT 0.6  --   --   PROT 5.4*  --   --   ALBUMIN <1.0* <1.0* <1.0*    CBC:  Recent Labs Lab 11/08/2015 1610 11/10/15 0400 11/11/15 0405  WBC 35.4* 29.4* 32.4*  HGB 7.1* 9.1* 8.4*  HCT 22.1* 27.5* 26.1*  MCV 81.5 79.7 81.3  PLT 476* 521* 484*    Cardiac Enzymes:  Recent Labs Lab 11/10/15 2113 11/11/15 0405 11/11/15 0839  TROPONINI 0.18* 1.43* 0.15*    BNP: No results for input(s): PROBNP in the last  8760 hours.  Coagulation:  Recent Labs Lab 12/02/2015 2105  INR 1.27    ECG:   Medications:   Scheduled Medications: . sodium chloride   Intravenous Once  . ampicillin-sulbactam (UNASYN) IV  3 g Intravenous Q8H  . antiseptic oral rinse  7 mL Mouth Rinse QID  . aspirin  325 mg Oral QHS  . atorvastatin  80 mg Oral q1800  . carvedilol  6.25 mg Oral BID WC  . chlorhexidine gluconate (SAGE KIT)  15 mL Mouth Rinse BID  . cholecalciferol  2,000 Units Oral Daily  . collagenase   Topical Daily  . darbepoetin (ARANESP) injection - NON-DIALYSIS  60 mcg Subcutaneous Q14 Days  . docusate sodium  100 mg Oral BID  . feeding supplement  1 Container Oral TID BM  . heparin subcutaneous  5,000 Units Subcutaneous Q8H  . insulin aspart  0-15 Units Subcutaneous Q4H  . multivitamin with minerals  1 tablet Oral Daily  . pantoprazole (PROTONIX) IV  40 mg Intravenous Q24H  . thiamine  100 mg Oral Daily  Infusions: . sodium chloride Stopped (11/29/2015 1516)  . sodium chloride 75 mL/hr at 11/11/15 1100  . 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 Hypoperfusion??. Had brief intra-operative brady/PEA arrest with ROSC after 2-3 mins.  1. PEA arrest - unclear etiology, occurred intra-op 12/01/2015. From the reports appears he developed severe bradycardia and hypotension leading to arrest, pattern not overally consistent with cardiac ischemia driven arrest - echo shows LVEF 55-60%, no WMAs - mild troponin elevation in setting of arrest, pattern not overally consistent with ACS. EKG without specific  ischemic changes - continue to follow medical progress, pending on course may consider ischemic evaluation this admission. Current renal function would prohibit cath.        Carlyle Dolly, M.D.,

## 2015-11-11 NOTE — Progress Notes (Signed)
Assessment/ Plan: Pt is a 40 y.o. 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  Assessment/Plan: 1. AoCKD3 - baseline cr 1.8, now 2.34. UOP falling. 2. Anemia- s/post transfusion  3. HTN/volume- soft blood pressures, net + 16,277, CXR with r effusion/edema.  Will stop IVF and give diuretic.  Subjective: Interval History: Extubated  Objective: Vital signs in last 24 hours: Temp:  [97.5 F (36.4 C)-98.7 F (37.1 C)] 97.7 F (36.5 C) (05/06 1207) Pulse Rate:  [80-87] 84 (05/06 1200) Resp:  [0-23] 9 (05/06 1200) BP: (99-126)/(59-77) 112/69 mmHg (05/06 1200) SpO2:  [90 %-100 %] 99 % (05/06 1200) Arterial Line BP: (109-155)/(52-80) 142/64 mmHg (05/06 1000) Weight:  [85.3 kg (188 lb 0.8 oz)] 85.3 kg (188 lb 0.8 oz) (05/06 0500) Weight change: 1.8 kg (3 lb 15.5 oz)  Intake/Output from previous day: 05/05 0701 - 05/06 0700 In: 3015.2 [I.V.:2835.2; NG/GT:80; IV Piggyback:100] Out: 920 [Urine:920] Intake/Output this shift: Total I/O In: 725 [I.V.:625; IV Piggyback:100] Out: 300 [Urine:275; Stool:25]  General appearance: alert and cooperative Resp: clear to auscultation bilaterally GI: post op  Lab Results:  Recent Labs  11/10/15 0400 11/11/15 0405  WBC 29.4* 32.4*  HGB 9.1* 8.4*  HCT 27.5* 26.1*  PLT 521* 484*   BMET:  Recent Labs  11/10/15 0400 11/11/15 0405  NA 136 137  K 4.0 3.9  CL 104 105  CO2 16* 15*  GLUCOSE 84 103*  BUN 45* 45*  CREATININE 2.44* 2.34*  CALCIUM 7.6* 7.3*   No results for input(s): PTH in the last 72 hours. Iron Studies: No results for input(s): IRON, TIBC, TRANSFERRIN, FERRITIN in the last 72 hours. Studies/Results: Dg Chest Port 1 View  11/11/2015  CLINICAL DATA:  Shortness of Breath EXAM: PORTABLE CHEST 1 VIEW COMPARISON:  Nov 09, 2015 FINDINGS: Endotracheal tube and nasogastric tube have been removed. No pneumothorax.  There is now layering effusion on the right with mild underlying interstitial edema. No airspace consolidation is appreciable. Heart is upper normal in size with pulmonary vascularity within normal limits. No adenopathy apparent. IMPRESSION: No pneumothorax. Layering effusion on the right with mild interstitial edema. Stable cardiac silhouette. Electronically Signed   By: Lowella Grip III M.D.   On: 11/11/2015 08:09   Dg Chest Portable 1 View  11/08/2015  CLINICAL DATA:  Postop.  Intubated patient. EXAM: PORTABLE CHEST 1 VIEW COMPARISON:  11/08/2015 FINDINGS: Endotracheal tube tip projects 2.9 cm above the carina. Nasal/orogastric tube passes well below the diaphragm into stomach. Lung volumes are low. Lungs are clear. No convincing pleural effusion or pneumothorax on this semi-erect study. Normal heart, mediastinum and hila. IMPRESSION: 1. Well-positioned endotracheal tube and nasal/orogastric tube. 2. No acute cardiopulmonary disease. Electronically Signed   By: Lajean Manes M.D.   On: 11/08/2015 16:18    Scheduled: . sodium chloride   Intravenous Once  . ampicillin-sulbactam (UNASYN) IV  3 g Intravenous Q8H  . antiseptic oral rinse  7 mL Mouth Rinse QID  . aspirin  325 mg Oral QHS  . atorvastatin  80 mg Oral q1800  . carvedilol  6.25 mg Oral BID WC  . chlorhexidine gluconate (SAGE KIT)  15 mL Mouth Rinse BID  . cholecalciferol  2,000 Units Oral Daily  . collagenase   Topical Daily  . darbepoetin (ARANESP) injection - NON-DIALYSIS  60 mcg Subcutaneous Q14 Days  . docusate sodium  100 mg  Oral BID  . feeding supplement  1 Container Oral TID BM  . heparin subcutaneous  5,000 Units Subcutaneous Q8H  . insulin aspart  0-15 Units Subcutaneous Q4H  . multivitamin with minerals  1 tablet Oral Daily  . pantoprazole (PROTONIX) IV  40 mg Intravenous Q24H  . thiamine  100 mg Oral Daily     LOS: 16 days   Denielle Bayard C 11/11/2015,12:57 PM

## 2015-11-11 NOTE — Progress Notes (Signed)
Dr. Darrick Penna is notified about patient's elevated troponin 1.43. Pt denies any chest pain. Vital signs are stable. Continue to monitor the patient.

## 2015-11-12 DIAGNOSIS — N184 Chronic kidney disease, stage 4 (severe): Secondary | ICD-10-CM

## 2015-11-12 DIAGNOSIS — R079 Chest pain, unspecified: Secondary | ICD-10-CM

## 2015-11-12 DIAGNOSIS — Z794 Long term (current) use of insulin: Secondary | ICD-10-CM

## 2015-11-12 LAB — GLUCOSE, CAPILLARY
GLUCOSE-CAPILLARY: 165 mg/dL — AB (ref 65–99)
GLUCOSE-CAPILLARY: 192 mg/dL — AB (ref 65–99)
GLUCOSE-CAPILLARY: 201 mg/dL — AB (ref 65–99)
Glucose-Capillary: 218 mg/dL — ABNORMAL HIGH (ref 65–99)
Glucose-Capillary: 120 mg/dL — ABNORMAL HIGH (ref 65–99)
Glucose-Capillary: 199 mg/dL — ABNORMAL HIGH (ref 65–99)

## 2015-11-12 LAB — RENAL FUNCTION PANEL
Albumin: <1 g/dL — ABNORMAL LOW (ref 3.5–5.0)
Anion gap: 12 (ref 5–15)
BUN: 43 mg/dL — AB (ref 6–20)
CALCIUM: 7.6 mg/dL — AB (ref 8.9–10.3)
CHLORIDE: 108 mmol/L (ref 101–111)
CO2: 15 mmol/L — AB (ref 22–32)
CREATININE: 2.28 mg/dL — AB (ref 0.61–1.24)
GFR calc Af Amer: 40 mL/min — ABNORMAL LOW (ref >60)
GFR calc non Af Amer: 34 mL/min — ABNORMAL LOW (ref >60)
GLUCOSE: 209 mg/dL — AB (ref 65–99)
Phosphorus: 5.9 mg/dL — ABNORMAL HIGH (ref 2.5–4.6)
Potassium: 3.6 mmol/L (ref 3.5–5.1)
SODIUM: 135 mmol/L (ref 135–145)

## 2015-11-12 LAB — TROPONIN I
TROPONIN I: 0.16 ng/mL — AB (ref <0.031)
Troponin I: 0.27 ng/mL — ABNORMAL HIGH (ref <0.031)
Troponin I: 0.32 ng/mL — ABNORMAL HIGH (ref <0.031)

## 2015-11-12 LAB — CBC
HCT: 25.8 % — ABNORMAL LOW (ref 39.0–52.0)
Hemoglobin: 8.1 g/dL — ABNORMAL LOW (ref 13.0–17.0)
MCH: 26 pg (ref 26.0–34.0)
MCHC: 31.4 g/dL (ref 30.0–36.0)
MCV: 82.7 fL (ref 78.0–100.0)
PLATELETS: 517 10*3/uL — AB (ref 150–400)
RBC: 3.12 MIL/uL — AB (ref 4.22–5.81)
RDW: 16.2 % — AB (ref 11.5–15.5)
WBC: 24.9 10*3/uL — ABNORMAL HIGH (ref 4.0–10.5)

## 2015-11-12 MED ORDER — DEXTROSE 5 % IV SOLN
100.0000 mg | Freq: Four times a day (QID) | INTRAVENOUS | Status: AC
  Administered 2015-11-12 (×2): 100 mg via INTRAVENOUS
  Filled 2015-11-12 (×2): qty 10

## 2015-11-12 NOTE — Progress Notes (Signed)
Assessment/ Plan: Pt is a 40 y.o. yo male with uncontrolled DM who was admitted on 10/24/2015 with AoCKD3 in the setting of diarrhea and s/p PEA arrest in the OR for diverting colostomy placement.  Assessment/Plan: 1. AoCKD3 - baseline cr 1.8, now 2.28<<2.34. UOP improving with diuresis 2. Anemia- s/post transfusion  3. HTN/volume- soft blood pressures, net + 15,592, CXR with r effusion/edema.  Continue Lasix x 2 doses again today  Renal Attending:  He remains volume overloaded, so will benifit from diuresis.  Renal function improving.   Serum bicarboate is low likely representing reexpansion acidosis. Ulrich Soules C   Subjective: Interval History: Extubated  Objective: Vital signs in last 24 hours: Temp:  [97.5 F (36.4 C)-98.8 F (37.1 C)] 98.8 F (37.1 C) (05/07 0750) Pulse Rate:  [77-89] 80 (05/07 0700) Resp:  [0-21] 12 (05/07 0700) BP: (100-133)/(59-79) 114/76 mmHg (05/07 0700) SpO2:  [90 %-100 %] 98 % (05/07 0700) Arterial Line BP: (129-142)/(60-64) 142/64 mmHg (05/06 1000) Weight:  [84.9 kg (187 lb 2.7 oz)] 84.9 kg (187 lb 2.7 oz) (05/07 0400) Weight change: -0.4 kg (-14.1 oz)  Intake/Output from previous day: 05/06 0701 - 05/07 0700 In: 1685 [P.O.:600; I.V.:665; IV Piggyback:420] Out: 1945 [Urine:1470; Stool:475] Intake/Output this shift:    General: NAD Pulm: CTAB CV: RRR GI: colostomy in place Extremities: diffuse edema, arms, legs and scortum  Lab Results:  Recent Labs  11/11/15 0405 11/12/15 0231  WBC 32.4* 24.9*  HGB 8.4* 8.1*  HCT 26.1* 25.8*  PLT 484* 517*   BMET:   Recent Labs  11/11/15 0405 11/12/15 0231  NA 137 135  K 3.9 3.6  CL 105 108  CO2 15* 15*  GLUCOSE 103* 209*  BUN 45* 43*  CREATININE 2.34* 2.28*  CALCIUM 7.3* 7.6*   No results for input(s): PTH in the last 72 hours. Iron Studies: No results for input(s): IRON, TIBC, TRANSFERRIN, FERRITIN in the last 72 hours. Studies/Results: Dg Chest Port 1 View  11/11/2015   CLINICAL DATA:  Shortness of Breath EXAM: PORTABLE CHEST 1 VIEW COMPARISON:  Nov 09, 2015 FINDINGS: Endotracheal tube and nasogastric tube have been removed. No pneumothorax. There is now layering effusion on the right with mild underlying interstitial edema. No airspace consolidation is appreciable. Heart is upper normal in size with pulmonary vascularity within normal limits. No adenopathy apparent. IMPRESSION: No pneumothorax. Layering effusion on the right with mild interstitial edema. Stable cardiac silhouette. Electronically Signed   By: Bretta Bang III M.D.   On: 11/11/2015 08:09    Scheduled: . sodium chloride   Intravenous Once  . ampicillin-sulbactam (UNASYN) IV  3 g Intravenous Q8H  . antiseptic oral rinse  7 mL Mouth Rinse QID  . aspirin  325 mg Oral QHS  . atorvastatin  80 mg Oral q1800  . carvedilol  6.25 mg Oral BID WC  . cholecalciferol  2,000 Units Oral Daily  . collagenase   Topical Daily  . darbepoetin (ARANESP) injection - NON-DIALYSIS  60 mcg Subcutaneous Q14 Days  . docusate sodium  100 mg Oral BID  . feeding supplement  1 Container Oral TID BM  . heparin subcutaneous  5,000 Units Subcutaneous Q8H  . insulin aspart  0-15 Units Subcutaneous Q4H  . multivitamin with minerals  1 tablet Oral Daily  . pantoprazole (PROTONIX) IV  40 mg Intravenous Q24H  . thiamine  100 mg Oral Daily     LOS: 17 days   Haney,Alyssa 11/12/2015,8:14 AM

## 2015-11-12 NOTE — Progress Notes (Signed)
eLink Physician-Brief Progress Note Patient Name: Shane Dougherty DOB: 02-Aug-1975 MRN: [id number]   Date of Service  11/12/2015  HPI/Events of Note  RN calls as pt requesting for vicodin or robaxin  eICU Interventions  Pt just extubated. Cont prn fentanyl. Address pain meds in am.      Intervention Category Intermediate Interventions: Medication change / dose adjustment  Latayna Ritchie Bridgette Habermann Dios 11/12/2015, 11:37 PM

## 2015-11-12 NOTE — Progress Notes (Signed)
Patient ID: Shane Dougherty, male   DOB: Jan 26, 1976, 40 y.o.   MRN: [id number]    Patient Name: Shane Dougherty Date of Encounter: 11/12/2015     Active Problems:   Hyperglycemia due to type 2 diabetes mellitus (HCC)   Hyponatremia   Essential hypertension   Diabetic neuropathy, type II diabetes mellitus (HCC)   Peri-rectal abscess   CKD (chronic kidney disease)   Leukocytosis   AKI (acute kidney injury) (HCC)   Pressure ulcer   Abscess, gluteal, left   Precordial pain   NSTEMI (non-ST elevated myocardial infarction) (HCC)   Pericardial effusion   Stroke [facility])   Perirectal abscess   Cardiac arrest (HCC)   Chest pain   Endotracheal tube present   Acute respiratory failure (HCC)    SUBJECTIVE  C/o chest soreness. Hurts to breath  CURRENT MEDS . sodium chloride   Intravenous Once  . ampicillin-sulbactam (UNASYN) IV  3 g Intravenous Q8H  . antiseptic oral rinse  7 mL Mouth Rinse QID  . aspirin  325 mg Oral QHS  . atorvastatin  80 mg Oral q1800  . carvedilol  6.25 mg Oral BID WC  . cholecalciferol  2,000 Units Oral Daily  . collagenase   Topical Daily  . darbepoetin (ARANESP) injection - NON-DIALYSIS  60 mcg Subcutaneous Q14 Days  . docusate sodium  100 mg Oral BID  . feeding supplement  1 Container Oral TID BM  . heparin subcutaneous  5,000 Units Subcutaneous Q8H  . insulin aspart  0-15 Units Subcutaneous Q4H  . multivitamin with minerals  1 tablet Oral Daily  . pantoprazole (PROTONIX) IV  40 mg Intravenous Q24H  . thiamine  100 mg Oral Daily    OBJECTIVE  Filed Vitals:   11/12/15 0500 11/12/15 0600 11/12/15 0700 11/12/15 0750  BP: 115/73 121/76 114/76   Pulse: 79 80 80   Temp:    98.8 F (37.1 C)  TempSrc:    Oral  Resp: 11 10 12    Height:      Weight:      SpO2: 98% 98% 98%     Intake/Output Summary (Last 24 hours) at 11/12/15 0849 Last data filed at 11/12/15 0700  Gross per 24 hour  Intake   1560 ml  Output   1820 ml  Net   -260 ml   Filed Weights    11/10/15 0200 11/11/15 0500 11/12/15 0400  Weight: 184 lb 1.4 oz (83.5 kg) 188 lb 0.8 oz (85.3 kg) 187 lb 2.7 oz (84.9 kg)    PHYSICAL EXAM  General: Pleasant, NAD. Neuro: Alert and oriented X 3. No movement in right foot, some in left foo HEENT:  Normal  Neck: Supple without bruits or JVD. Lungs:  Resp regular and unlabored, CTA. Heart: RRR no s3, s4, or murmurs. Abdomen: Soft, non-tender, non-distended, BS + x 4.  Extremities: No clubbing, cyanosis, 1+ edema. DP/PT/Radials 2+ and equal bilaterally.  Accessory Clinical Findings  CBC  Recent Labs  11/11/2015 1610  11/11/15 0405 11/12/15 0231  WBC 35.4*  < > 32.4* 24.9*  NEUTROABS 33.6*  --   --   --   HGB 7.1*  < > 8.4* 8.1*  HCT 22.1*  < > 26.1* 25.8*  MCV 81.5  < > 81.3 82.7  PLT 476*  < > 484* 517*  < > = values in this interval not displayed. Basic Metabolic Panel  Recent Labs  11/11/2015 1605  11/11/15 0405 11/12/15 0231  NA 136  < >  137 135  K 3.7  < > 3.9 3.6  CL 110  < > 105 108  CO2 15*  < > 15* 15*  GLUCOSE 74  < > 103* 209*  BUN 42*  < > 45* 43*  CREATININE 2.19*  < > 2.34* 2.28*  CALCIUM 6.8*  < > 7.3* 7.6*  MG 1.8  --  2.0  --   PHOS 5.7*  < > 6.7* 5.9*  < > = values in this interval not displayed. Liver Function Tests  Recent Labs  11/19/2015 1605  11/11/15 0405 11/12/15 0231  AST 31  --   --   --   ALT 7*  --   --   --   ALKPHOS 839*  --   --   --   BILITOT 0.6  --   --   --   PROT 5.4*  --   --   --   ALBUMIN <1.0*  < > <1.0* <1.0*  < > = values in this interval not displayed. No results for input(s): LIPASE, AMYLASE in the last 72 hours. Cardiac Enzymes  Recent Labs  11/11/15 1306 11/11/15 1959 11/12/15 0231  TROPONINI 0.26* 0.11* 0.27*   BNP Invalid input(s): POCBNP D-Dimer No results for input(s): DDIMER in the last 72 hours. Hemoglobin A1C No results for input(s): HGBA1C in the last 72 hours. Fasting Lipid Panel No results for input(s): CHOL, HDL, LDLCALC, TRIG, CHOLHDL,  LDLDIRECT in the last 72 hours. Thyroid Function Tests No results for input(s): TSH, T4TOTAL, T3FREE, THYROIDAB in the last 72 hours.  Invalid input(s): FREET3  TELE  nsr  Radiology/Studies  Ct Abdomen Pelvis Wo Contrast  10/29/2015  CLINICAL DATA:  Inflammatory bowel disease. Left gluteal abscess. Prominent leukocytosis. Renal failure. Inpatient. EXAM: CT ABDOMEN AND PELVIS WITHOUT CONTRAST TECHNIQUE: Multidetector CT imaging of the abdomen and pelvis was performed following the standard protocol without IV contrast. COMPARISON:  No prior CT abdomen/ pelvis. 10/28/2015 renal sonogram. FINDINGS: Examination is limited by streak artifact from the patient's upper extremities. Lower chest: Small bilateral pleural effusions with associated compressive atelectasis in the dependent lower lobes. Small pericardial effusion/ thickening. Hepatobiliary: Normal liver with no liver mass. Normal gallbladder with no radiopaque cholelithiasis. No biliary ductal dilatation. Pancreas: Normal, with no mass or duct dilation. Spleen: Normal size. No mass. Adrenals/Urinary Tract: Normal adrenals. No hydronephrosis. No renal stones. No contour deforming renal mass. Urinary bladder is completely collapsed by indwelling Foley catheter and not well evaluated. Gas in the nondependent bladder lumen is expected given instrumentation. Stomach/Bowel: Grossly normal stomach. There is an approximately 10 cm length segment of mid to distal small bowel in the anterior lower abdomen (series 2/image 77) demonstrating mild-to-moderate circumferential bowel wall thickening. Normal caliber small bowel. Oral contrast reaches the distal rectum. Normal appendix. Normal large bowel with no diverticulosis, large bowel wall thickening or pericolonic fat stranding. Vascular/Lymphatic: Atherosclerotic nonaneurysmal abdominal aorta. Mild bilateral inguinal lymphadenopathy, largest 1.6 cm on the right (series 2/ image 99) and 1.6 cm on the left (series  2/ image 101. No additional pathologically enlarged lymph nodes in the abdomen or pelvis. Reproductive: Top-normal size prostate. Other: No pneumoperitoneum. Small volume abdominopelvic ascites. Musculoskeletal: No aggressive appearing focal osseous lesions. There are multiple small erosions along the sacroiliac joints bilaterally, in keeping with sacroiliitis. Symmetric mild gynecomastia. Anasarca. There is superficial subcutaneous fat stranding in the bilateral lower gluteal cleft surrounding a superficial surgical drain, with no focal drainable superficial or intra-abdominal fluid collections. IMPRESSION: 1. Prominent  superficial subcutaneous fat stranding in the bilateral lower gluteal cleft surrounding a superficial surgical drain, with no focal drainable superficial or intra-abdominal fluid collections. 2. Approximately 10 cm in length small bowel loop in the mid to distal small bowel with circumferential small bowel wall thickening, suggesting active enteritis. Symmetric erosive sacroiliitis. Normal large bowel. This constellation of findings is most suggestive of Crohn disease (not ulcerative colitis). 3. Third-spacing, including small bilateral pleural effusions, small pericardial effusion, small volume ascites and anasarca. 4. Mild bilateral inguinal lymphadenopathy, nonspecific. Electronically Signed   By: Delbert Phenix M.D.   On: 10/29/2015 15:48   Dg Chest 2 View  10/29/2015  CLINICAL DATA:  Dyspnea EXAM: CHEST  2 VIEW COMPARISON:  08/29/2015 chest radiograph. FINDINGS: Low lung volumes. Stable cardiomediastinal silhouette with normal heart size. No pneumothorax. Small bilateral pleural effusions. No pulmonary edema. Patchy opacity in both lower lobes. IMPRESSION: 1. Small bilateral pleural effusions. 2. Patchy opacity in both lower lobes, favor atelectasis. Electronically Signed   By: Delbert Phenix M.D.   On: 10/29/2015 19:37   Mr Brain Wo Contrast  10/31/2015  CLINICAL DATA:  Recent drainage of  left gluteal abscess. Now with leg weakness. Diabetes EXAM: MRI HEAD WITHOUT CONTRAST MRI CERVICAL SPINE WITHOUT CONTRAST TECHNIQUE: Multiplanar, multiecho pulse sequences of the brain and surrounding structures, and cervical spine, to include the craniocervical junction and cervicothoracic junction, were obtained without intravenous contrast. COMPARISON:  MRI 08/20/2014 FINDINGS: MRI HEAD FINDINGS Multiple small foci of restricted diffusion in the cerebral hemispheres bilaterally. These are present in the posterior temporal lobe bilaterally, the left parietal periventricular white matter, frontal lobes bilaterally. Sparing of the brainstem and cerebellum. 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 Ventricle size normal.  Cerebral volume normal. Negative for intracranial hemorrhage. Negative for mass or edema. No shift of the midline structures. Mucosal thickening throughout the paranasal sinuses. Mastoid sinus effusion bilaterally. Pituitary and skull base normal.  Circle Willis patent. MRI CERVICAL SPINE FINDINGS Image quality degraded by motion. Straightening of the cervical lordosis with mild kyphosis. Normal alignment. Negative for fracture or mass Spinal cord signal is normal.  No cord compression or cord lesion. Mild cervical degenerative change. No disc protrusion or spinal stenosis. Diffusion-weighted imaging of the cord negative for infarct. 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 Negative for intracranial hemorrhage or mass No acute abnormality in the cervical spine. Negative for spinal stenosis. Electronically Signed   By: Marlan Palau M.D.   On: 10/31/2015 21:11   Mr Cervical Spine Wo Contrast  10/31/2015  CLINICAL DATA:  Recent drainage of left gluteal abscess. Now with leg weakness. Diabetes EXAM: MRI HEAD WITHOUT CONTRAST MRI CERVICAL SPINE WITHOUT CONTRAST  TECHNIQUE: Multiplanar, multiecho pulse sequences of the brain and surrounding structures, and cervical spine, to include the craniocervical junction and cervicothoracic junction, were obtained without intravenous contrast. COMPARISON:  MRI 08/20/2014 FINDINGS: MRI HEAD FINDINGS Multiple small foci of restricted diffusion in the cerebral hemispheres bilaterally. These are present in the posterior temporal lobe bilaterally, the left parietal periventricular white matter, frontal lobes bilaterally. Sparing of the brainstem and cerebellum. 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 Ventricle size normal.  Cerebral volume normal. Negative for intracranial hemorrhage. Negative for mass or edema. No shift of the midline structures. Mucosal thickening throughout the paranasal sinuses. Mastoid sinus effusion bilaterally. Pituitary and skull base normal.  Circle Willis patent. MRI CERVICAL SPINE FINDINGS Image quality degraded by motion. Straightening of the cervical lordosis with mild kyphosis. Normal alignment. Negative for fracture or mass Spinal cord signal is normal.  No cord compression or cord lesion. Mild cervical degenerative change. No disc protrusion or spinal stenosis. Diffusion-weighted imaging of the cord negative for infarct. 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 Negative for intracranial hemorrhage or mass No acute abnormality in the cervical spine. Negative for spinal stenosis. Electronically Signed   By: Marlan Palau M.D.   On: 10/31/2015 21:11   Mr Thoracic Spine Wo Contrast  11/01/2015  CLINICAL DATA:  Sepsis and end-stage renal disease patient. Weakness. The patient is unable to move his left upper extremity. No known injury. Initial encounter. EXAM: MRI THORACIC SPINE WITHOUT CONTRAST TECHNIQUE: Multiplanar, multisequence MR imaging of the thoracic spine was  performed. No intravenous contrast was administered. COMPARISON:  CT abdomen and pelvis 10/29/2015 reviewed. FINDINGS: Vertebral body height and alignment are maintained. No worrisome marrow lesion is identified. Marrow signal is somewhat decreased on T1 weighted imaging likely related to marrow stimulation in association with dialysis. There is no evidence of discitis or osteomyelitis. No epidural abscess is identified. The thoracic cord demonstrates normal signal throughout. Disc height and hydration are maintained at all levels. Paraspinous structures demonstrate bilateral pleural effusions which appear small to moderate. IMPRESSION: Normal-appearing thoracic spine. Negative for evidence of infection. Small moderate bilateral pleural effusions. Electronically Signed   By: Drusilla Kanner M.D.   On: 11/01/2015 13:56   Mr Lumbar Spine Wo Contrast  10/31/2015  CLINICAL DATA:  Leukocytosis and fever with perirectal abscess status post irrigation and drainage x2. Lower extremity weakness with difficulty ambulating. History of chronic kidney disease and diabetes. EXAM: MRI LUMBAR SPINE WITHOUT CONTRAST TECHNIQUE: Multiplanar, multisequence MR imaging of the lumbar spine was performed. No intravenous contrast was administered. COMPARISON:  Abdominal pelvic CT 10/29/2015 FINDINGS: Segmentation: Conventional anatomy assumed, with the last open disc space designated L5-S1. Alignment:  Normal. Vertebrae: There is generalized decreased T1 and T2 marrow signal throughout the visualized bones which is attributed to chronic kidney disease. No acute osseous findings. No evidence of pars defect or discitis. Small erosions along the sacroiliac joints are likely related to the patient's renal disease and secondary hyperparathyroidism. No evidence of sacroiliac joint effusion to suggest infection. Conus medullaris: Extends to the L1 level and appears normal. No evidence of epidural fluid collection. Paraspinal and other soft  tissues: There is extensive edema throughout the paraspinal soft tissues, subcutaneous fat and retroperitoneum. No focal fluid collections are seen to suggest infection. There is free pelvic fluid, similar to recent CT. Disc levels: All of the lumbar discs are well hydrated with maintained height. No evidence of disc herniation, spinal stenosis or nerve root encroachment. No significant facet arthropathy. IMPRESSION: 1. No evidence of spinal infection. Specifically, no evidence of discitis, facet joint effusion or epidural abscess. 2. No disc herniation, spinal stenosis or nerve root encroachment. 3. Extensive soft tissue edema throughout the subcutaneous, retroperitoneal and peritoneal soft tissues, consistent with anasarca, similar to recent CT. 4. Marrow and sacroiliac changes attributed to underlying renal disease. Electronically Signed   By: Carey Bullocks M.D.   On: 10/31/2015 11:30   US Renal  10/28/2015  CLINICAL DATA:  Acute renal failure EXAM: RENAL / URINARY TRACT ULTRASOUND COMPLETE COMPARISON:  None. FINDINGS: Right Kidney: Length: 13.0 cm. Echogenicity within normal limits. No mass or hydronephrosis  visualized. Left Kidney: Length: 13.0 cm. Echogenicity within normal limits. No mass or hydronephrosis visualized. Bladder: Foley catheter in position. Bilateral pleural effusions. IMPRESSION: 1. Normal kidneys. 2. Bilateral pleural effusions Electronically Signed   By: Genevive Bi M.D.   On: 10/28/2015 17:37   Dg Chest Port 1 View  11/11/2015  CLINICAL DATA:  Shortness of Breath EXAM: PORTABLE CHEST 1 VIEW COMPARISON:  Nov 09, 2015 FINDINGS: Endotracheal tube and nasogastric tube have been removed. No pneumothorax. There is now layering effusion on the right with mild underlying interstitial edema. No airspace consolidation is appreciable. Heart is upper normal in size with pulmonary vascularity within normal limits. No adenopathy apparent. IMPRESSION: No pneumothorax. Layering effusion on the  right with mild interstitial edema. Stable cardiac silhouette. Electronically Signed   By: Bretta Bang III M.D.   On: 11/11/2015 08:09   Dg Chest Portable 1 View  11/19/2015  CLINICAL DATA:  Postop.  Intubated patient. EXAM: PORTABLE CHEST 1 VIEW COMPARISON:  11/08/2015 FINDINGS: Endotracheal tube tip projects 2.9 cm above the carina. Nasal/orogastric tube passes well below the diaphragm into stomach. Lung volumes are low. Lungs are clear. No convincing pleural effusion or pneumothorax on this semi-erect study. Normal heart, mediastinum and hila. IMPRESSION: 1. Well-positioned endotracheal tube and nasal/orogastric tube. 2. No acute cardiopulmonary disease. Electronically Signed   By: Amie Portland M.D.   On: 11/26/2015 16:18   Dg Chest Port 1 View  11/08/2015  CLINICAL DATA:  40 year old with generalized chest pain associated with shortness of breath and cough which began earlier today. Current history of diabetes. EXAM: PORTABLE CHEST 1 VIEW COMPARISON:  10/29/2015 and earlier. FINDINGS: Markedly suboptimal inspiration accounts for atelectasis in the lung bases and accentuates the cardiac silhouette. Taking this into account, cardiac silhouette upper normal in size. Lungs otherwise clear. Bronchovascular markings normal. Pulmonary vascularity normal. No visible pleural effusions. IMPRESSION: Suboptimal inspiration accounts for bibasilar atelectasis. No acute cardiopulmonary disease otherwise. Electronically Signed   By: Hulan Saas M.D.   On: 11/08/2015 20:58    ASSESSMENT AND PLAN  1. PEA arrest - no recurrent hypotension. The bradycardia associate with this is secondary to the arrest. I suspect the scenario leading to his arrest was sepsis due to the abscess leading to hypovolemia. No cardiac workup indicated at this time. Manage conservatively. We will sign off. Call for questions or additional cardiac issures.  Kianni Lheureux,M.D.  11/12/2015 8:49 AM

## 2015-11-12 NOTE — Progress Notes (Signed)
800 VC, -40 NIF. Good patient effort.

## 2015-11-12 NOTE — Progress Notes (Signed)
11/12/2015 Patient transfer from 2S to 2central at 1806 he is alert, oriented non ambulatory. Patient have a foley cath, lef colostomy and had a I and D this admission. dressing clean and intact. Patient feet dry and bruises on arms. Patient have generalize swelling, scrotum and penis swelling plus 4. Patient unable to move lower extremities. [facility] RN.

## 2015-11-12 NOTE — Progress Notes (Signed)
PULMONARY / CRITICAL CARE MEDICINE   Name: Shane Dougherty MRN: [id number] DOB: 11/26/1975    ADMISSION DATE:  10/24/2015 CONSULTATION DATE:    REFERRING MD:  5/4  CHIEF COMPLAINT:  Post arrest    SUBJECTIVE:  Pain tolerable. "I just want to walk again"  VITAL SIGNS: BP 114/76 mmHg  Pulse 80  Temp(Src) 98.8 F (37.1 C) (Oral)  Resp 12  Ht 5\' 7"  (1.702 m)  Wt 187 lb 2.7 oz (84.9 kg)  BMI 29.31 kg/m2  SpO2 98%  HEMODYNAMICS:    VENTILATOR SETTINGS:    INTAKE / OUTPUT:  Intake/Output Summary (Last 24 hours) at 11/12/15 9562 Last data filed at 11/12/15 0700  Gross per 24 hour  Intake   1435 ml  Output   1820 ml  Net   -385 ml    PHYSICAL EXAMINATION: General:  Chronically ill appearing male, NAD lying in bed, no distress Neuro:  Awake, cooperative. Follows commands. Oriented. Extremely weak LEs.  HEENT:  Mm moist, no JVD Cardiovascular:  s1s2 rrr no MRG Lungs:  resps even non labored, scattered rhonchi that improve w/ cough  Abdomen:  Round, soft, incision c/d, pink ostomy with min outpt  Musculoskeletal:  Warm and dry, scant BLE edema    LABS:  BMET  Recent Labs Lab 11/10/15 0400 11/11/15 0405 11/12/15 0231  NA 136 137 135  K 4.0 3.9 3.6  CL 104 105 108  CO2 16* 15* 15*  BUN 45* 45* 43*  CREATININE 2.44* 2.34* 2.28*  GLUCOSE 84 103* 209*    Electrolytes  Recent Labs Lab 11/08/2015 1605 11/10/15 0400 11/11/15 0405 11/12/15 0231  CALCIUM 6.8* 7.6* 7.3* 7.6*  MG 1.8  --  2.0  --   PHOS 5.7* 6.3* 6.7* 5.9*    CBC  Recent Labs Lab 11/10/15 0400 11/11/15 0405 11/12/15 0231  WBC 29.4* 32.4* 24.9*  HGB 9.1* 8.4* 8.1*  HCT 27.5* 26.1* 25.8*  PLT 521* 484* 517*    Coag's  Recent Labs Lab 12/02/2015 2105  INR 1.27    Sepsis Markers  Recent Labs Lab 11/08/2015 1606 12/05/2015 1610 11/16/2015 2105 11/10/15 0400 11/11/15 0405  LATICACIDVEN 0.9  --  0.8  --   --   PROCALCITON  --  3.15  --  4.49 3.67    ABG  Recent Labs Lab  12/05/2015 1458 11/10/15 1005  PHART 7.376 7.439  PCO2ART 30.7* 26.0*  PO2ART 182.0* 161.0*    Liver Enzymes  Recent Labs Lab 11/25/2015 1605 11/10/15 0400 11/11/15 0405 11/12/15 0231  AST 31  --   --   --   ALT 7*  --   --   --   ALKPHOS 839*  --   --   --   BILITOT 0.6  --   --   --   ALBUMIN <1.0* <1.0* <1.0* <1.0*    Cardiac Enzymes  Recent Labs Lab 11/11/15 1306 11/11/15 1959 11/12/15 0231  TROPONINI 0.26* 0.11* 0.27*    Glucose  Recent Labs Lab 11/11/15 1204 11/11/15 1629 11/11/15 2032 11/12/15 0034 11/12/15 0430 11/12/15 0741  GLUCAP 162* 193* 227* 218* 165* 120*    Imaging No results found.   STUDIES:  4/25 MRI brain >> Multiple small foci of restricted diffusion in both cerebral hemispheres most consistent with embolic infarctions. Echo 4/24 >> EF 55-60%, grade 2 diastolic dysfunction, PA pressure , small pericardial effusion  UE Duplex 4/26  >> no evidence of DVT LE Duplex 4/26 >> no evidence of DVT  Carotid Doppler 4/26 >> no extracranial carotid artery stenosis  TEE 4/27 >> EF 60-65%, no evidence of vegetation, trivial pericardial effusion 2D echo 5/4 >> LV mild inferior hypokinesis, nml size, EF 55-60%, mild MR, PA peak 34, small pericardial effusion  CULTURES: 4/23 CDiff >> neg  4/26 BCx2 >> neg  4/21 abscess >> mod group B strep  ANTIBIOTICS: Unasyn 4/28 >>  SIGNIFICANT EVENTS: 4/21  OR for I&D L gluteal abscess  5/02  OR for sacral debridement  5/04  OR for diverting colostomy >> PEA arrest   LINES/TUBES: ETT 5/4 >>5/5  DISCUSSION: 40 y/o male with uncontrolled DM with complicated course r/t peri-rectal abscess, sepsis, Acute on chronic renal failure and 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 Hypoperfusion??.  Had brief intra-operative brady/PEA arrest with ROSC after 2-3 mins.  Returned to ICU on vent on 5/4. Extubated 5/5. Now awake and no sig c/o. Will adv activity. Cont  PT/OT, abx and defer diet to surgical services. Ready for xfer to SDU. Will likely need SNF. Will ask triad to re-assume care   ASSESSMENT / PLAN:  PULMONARY Acute respiratory failure -- post brief arrest ->now resolved Effusion rt P:   Trend pulse ox Mobilize  Aspiration precautions neg balance, may need to [place] rt chest if not improved   CARDIOVASCULAR PEA arrest - intra-op 5/4 Chest pain - intermittent during hospitalization  Small Pericardial Effusion Elevated troponin  HTN NSTEMI  P:  Trend troponin  Cardiology following, appreciate input Cont BB/coreg, lipitor  RENAL Acute on CKD 3 - baseline Scr ~1.6-1.8, no hydro on renal US-->creatinine trending down Non-AG acidosis P:   Renal following and managing IVFs Continue lasix 5/7 Trend BMP / UOP Replace electrolytes as indicated    GASTROINTESTINAL Diarrhea - ongoing.  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  Some ?chrons disease  Severe protein calorie malnutrition  P:   Will need ongoing GI f/u as outpt  Surgery following  Starting clears and boost/ensure per surgical  PPI for SUP  HEMATOLOGIC Anemia  P:  F/u cbc  SCD's    INFECTIOUS Leukocytosis - persistently elevated wbc  Peri-rectal abscess  P:   Unasyn started 4/28>>> (consider stop date 5/12 which would be 8d rx AFTER surgical intervention) Monitor fever curve / WBC Cultures as above  WOC / PT for hydrotherapy  ENDOCRINE Uncontrolled DM   P:   SSI  Hold lantus for now while NPO   NEUROLOGIC Profound lower extremity weakness -  due to probable anterior spinal infarct and multiple embolic cerebral  Embolic CVA  P:   RASS goal: 0  Serial neuro exams Neurology previously following PT consulted  FAMILY  - Updates: no family available 5/5.    - Inter-disciplinary family meet or Palliative Care meeting due by:  Day 7   Simonne Martinet ACNP-BC [facility] Pulmonary/Critical Care Pager # [phone] OR # [phone] if no answer  11/12/2015, 9:04 AM   STAFF NOTE: I,  Rory Percy, MD FACP have personally reviewed patient's available data, including medical history, events of note, physical examination and test results as part of my evaluation. I have discussed with resident/NP and other care providers such as pharmacist, RN and RRT. In addition, I personally evaluated patient and elicited key findings of: awake in bed, no distress, last pcxr with rt effusion, was about even last 24 hours, would favor neg 1 liter ans assess crt trend , hope crt can recover then to cath, will [place] chest rt if  effusion remains present despite lasix to neg balance, needs aggressive pt,  Min gas noted, , ful liq per ccs, await further bowel function, can transfer out to triad, wbc is resolving, fruther trop not helpful, he is chest pain free  [name]. Tyson Alias, MD, FACP Pgr: [phone] [facility] 11/12/2015 12:35 PM

## 2015-11-13 LAB — RENAL FUNCTION PANEL
ANION GAP: 12 (ref 5–15)
Albumin: <1 g/dL — ABNORMAL LOW (ref 3.5–5.0)
BUN: 43 mg/dL — AB (ref 6–20)
CHLORIDE: 107 mmol/L (ref 101–111)
CO2: 16 mmol/L — ABNORMAL LOW (ref 22–32)
Calcium: 7.6 mg/dL — ABNORMAL LOW (ref 8.9–10.3)
Creatinine, Ser: 2.05 mg/dL — ABNORMAL HIGH (ref 0.61–1.24)
GFR calc Af Amer: 45 mL/min — ABNORMAL LOW (ref >60)
GFR, EST NON AFRICAN AMERICAN: 39 mL/min — AB (ref >60)
Glucose, Bld: 235 mg/dL — ABNORMAL HIGH (ref 65–99)
POTASSIUM: 3.4 mmol/L — AB (ref 3.5–5.1)
Phosphorus: 5.5 mg/dL — ABNORMAL HIGH (ref 2.5–4.6)
Sodium: 135 mmol/L (ref 135–145)

## 2015-11-13 LAB — GLUCOSE, CAPILLARY
GLUCOSE-CAPILLARY: 182 mg/dL — AB (ref 65–99)
GLUCOSE-CAPILLARY: 220 mg/dL — AB (ref 65–99)
GLUCOSE-CAPILLARY: 255 mg/dL — AB (ref 65–99)
Glucose-Capillary: 178 mg/dL — ABNORMAL HIGH (ref 65–99)
Glucose-Capillary: 193 mg/dL — ABNORMAL HIGH (ref 65–99)
Glucose-Capillary: 213 mg/dL — ABNORMAL HIGH (ref 65–99)

## 2015-11-13 LAB — CBC
HEMATOCRIT: 24.8 % — AB (ref 39.0–52.0)
HEMOGLOBIN: 7.9 g/dL — AB (ref 13.0–17.0)
MCH: 26.8 pg (ref 26.0–34.0)
MCHC: 31.9 g/dL (ref 30.0–36.0)
MCV: 84.1 fL (ref 78.0–100.0)
Platelets: 491 10*3/uL — ABNORMAL HIGH (ref 150–400)
RBC: 2.95 MIL/uL — AB (ref 4.22–5.81)
RDW: 16.3 % — AB (ref 11.5–15.5)
WBC: 19 10*3/uL — ABNORMAL HIGH (ref 4.0–10.5)

## 2015-11-13 LAB — TROPONIN I
Troponin I: 0.09 ng/mL — ABNORMAL HIGH (ref <0.031)
Troponin I: 0.66 ng/mL (ref <0.031)
Troponin I: 0.09 ng/mL — ABNORMAL HIGH (ref <0.031)
Troponin I: 0.15 ng/mL — ABNORMAL HIGH (ref <0.031)

## 2015-11-13 MED ORDER — ZOLPIDEM TARTRATE 5 MG PO TABS
5.0000 mg | ORAL_TABLET | Freq: Every evening | ORAL | Status: DC | PRN
  Filled 2015-11-13: qty 1

## 2015-11-13 MED ORDER — INSULIN ASPART 100 UNIT/ML ~~LOC~~ SOLN
0.0000 [IU] | Freq: Three times a day (TID) | SUBCUTANEOUS | Status: DC
  Administered 2015-11-13: 11 [IU] via SUBCUTANEOUS
  Administered 2015-11-13: 7 [IU] via SUBCUTANEOUS

## 2015-11-13 MED ORDER — DEXTROSE 5 % IV SOLN
160.0000 mg | Freq: Three times a day (TID) | INTRAVENOUS | Status: DC
  Administered 2015-11-13 – 2015-11-14 (×3): 160 mg via INTRAVENOUS
  Filled 2015-11-13 (×7): qty 16

## 2015-11-13 MED ORDER — INSULIN GLARGINE 100 UNIT/ML ~~LOC~~ SOLN
10.0000 [IU] | Freq: Every day | SUBCUTANEOUS | Status: DC
  Administered 2015-11-13: 10 [IU] via SUBCUTANEOUS
  Filled 2015-11-13 (×2): qty 0.1

## 2015-11-13 MED ORDER — FUROSEMIDE 10 MG/ML IJ SOLN
100.0000 mg | Freq: Four times a day (QID) | INTRAMUSCULAR | Status: DC
  Filled 2015-11-13 (×3): qty 10

## 2015-11-13 MED ORDER — INSULIN ASPART 100 UNIT/ML ~~LOC~~ SOLN: 0.0000 [IU] | Freq: Every day | SUBCUTANEOUS | Status: DC

## 2015-11-13 MED ORDER — POTASSIUM CHLORIDE CRYS ER 20 MEQ PO TBCR
40.0000 meq | EXTENDED_RELEASE_TABLET | Freq: Two times a day (BID) | ORAL | Status: AC
  Administered 2015-11-13 (×2): 40 meq via ORAL
  Filled 2015-11-13 (×2): qty 2

## 2015-11-13 MED ORDER — PANTOPRAZOLE SODIUM 40 MG PO TBEC
40.0000 mg | DELAYED_RELEASE_TABLET | Freq: Every day | ORAL | Status: DC
  Administered 2015-11-13: 40 mg via ORAL
  Filled 2015-11-13: qty 1

## 2015-11-13 NOTE — Progress Notes (Signed)
TEAM 1 - Stepdown/ICU TEAM  Shane Dougherty  JXB:[id number] DOB: 1975/09/23 DOA: 10/27/2015 PCP: Jaclyn Shaggy, MD  Outpatient Specialists:   Brief Narrative:  41 y.o. male with history of uncontrolled diabetes who presented w/ diarrhea x 1 month, followed by L buttock discomfort. He was seen in the ER where he was diagnosed with intergluteal infection with small abscess. An I/D was done and he was sent home on clindamycin.  He later returned w/  worsening pain and pressure, and was found to have an extensive L gluteal abscess.  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 Profound B LE weakness - B UE weakness appears to be improving, but fine motor movement remains quite impaired - therapy to continue   Embolic CVA - bilateral anterior circulation No evidence of vegetation on TEE - suspected to be related to sepsis alone - Neurology has evaluated  PEA arrest - intra-op 5/4 LV fxn remained intact on f/u TTE - felt to be due to sepsis  Chest pain / Elevated troponin / NSTEMI  intermittent during hospitalization - Cardiology has evaluated - suggestion is for outpt stress testing once recovered - current pain most c/w soreness from CPR  Small Pericardial Effusion Not hemodynamically significant   HTN Follow BP w/o change in tx today   Acute renal failure on CKD 3 baseline Scr ~1.6-1.8 - no hydro on renal US - suspected ischemic ATN  Recent Labs Lab 11/08/2015 1605 11/10/15 0400 11/11/15 0405 11/12/15 0231 11/13/15 0215  CREATININE 2.19* 2.44* 2.34* 2.28* 2.05*    Diarrhea  S/p colostomy 5/4 - ?chrons disease    Severe protein calorie malnutrition   Anemia Hgb stable - due to ongoing severe infection + renal failure    Recent Labs Lab 11/08/15 2348 11/30/2015 1610 11/10/15 0400 11/11/15 0405 11/12/15 0231 11/13/15 0215  HGB 8.1* 7.1* 9.1* 8.4* 8.1* 7.9*    Peri-rectal abscess  WOC / PT for hydrotherapy - Gen Surgery following   Uncontrolled DM  CBG not at goal - adjust tx and follow  DVT prophylaxis: SQ heparin  Code Status: FULL Family Communication: spoke w/ brother at bedside  Disposition Plan: SDU   Consultants:  Gen Surgery  Neurology PCCM [facility]  Procedures: 4/24 TTE EF 55-60%, grade 2 diastolic dysfunction, PA pressure , small pericardial effusion  4/26 B LE Duplex > no evidence of DVT 4/26 Carotid Doppler > no extracranial carotid artery stenosis  4/26 B UE Duplex > no evidence of DVT 4/27 TEE > EF 60-65%, no evidence of vegetation, trivial pericardial effusion 5/04 TTE > EF 55-60% - small pericardial effusion   Antimicrobials:  Unasyn 4/27 >  Subjective: The patient has complained of some musculoskeletal chest pain this morning.  He denies shortness of breath fevers chills or abdominal pain.  He states that overall he feels better.  He feels the strength in his arms is improving but he has not noticed any improvement in the strength of his legs.  Objective: Blood pressure 145/90, pulse 79, temperature 98.4 F (36.9 C), temperature source Axillary, resp. rate 16, height 5\' 7"  (1.702 m), weight 91.627 kg (202 lb), SpO2 100 %.  Intake/Output Summary (Last 24 hours) at 11/13/15 0958 Last data filed at 11/13/15 0600  Gross per 24 hour  Intake    620 ml  Output   2175 ml  Net  -1555 ml   Filed Weights   11/11/15 0500 11/12/15 0400 11/13/15 0350  Weight: 85.3 kg (188 lb 0.8 oz) 84.9 kg (187 lb 2.7 oz) 91.627 kg (202 lb)    Examination: General: No acute respiratory distress Lungs: Clear to auscultation bilaterally without wheezes or  crackles Cardiovascular: Regular rate and rhythm without murmur gallop or rub  Abdomen: Nondistended, soft, bowel sounds positive, no rebound, no ascites, no appreciable mass Extremities: No significant cyanosis, or clubbing;  1+ edema bilateral lower extremities   CBC:  Recent Labs Lab 12/05/2015 1610 11/10/15 0400 11/11/15 0405 11/12/15 0231 11/13/15 0215  WBC 35.4* 29.4* 32.4* 24.9* 19.0*  NEUTROABS 33.6*  --   --   --   --   HGB 7.1* 9.1* 8.4* 8.1* 7.9*  HCT 22.1* 27.5* 26.1* 25.8* 24.8*  MCV 81.5 79.7 81.3 82.7 84.1  PLT 476* 521* 484* 517* 491*   Basic Metabolic Panel:  Recent Labs Lab 11/25/2015 1605 11/10/15 0400 11/11/15 0405 11/12/15 0231 11/13/15 0215  NA 136 136 137 135 135  K 3.7 4.0 3.9 3.6 3.4*  CL 110 104 105 108 107  CO2 15* 16* 15* 15* 16*  GLUCOSE 74 84 103* 209* 235*  BUN 42* 45* 45* 43* 43*  CREATININE 2.19* 2.44* 2.34* 2.28* 2.05*  CALCIUM 6.8* 7.6* 7.3* 7.6* 7.6*  MG 1.8  --  2.0  --   --   PHOS 5.7* 6.3* 6.7* 5.9* 5.5*   GFR: Estimated Creatinine Clearance: 51.2 mL/min (by C-G formula based on Cr of 2.05).  Liver Function Tests:  Recent Labs Lab 11/12/2015 1605 11/10/15 0400 11/11/15 0405 11/12/15 0231 11/13/15 0215  AST 31  --   --   --   --   ALT 7*  --   --   --   --   ALKPHOS 839*  --   --   --   --   BILITOT 0.6  --   --   --   --   PROT 5.4*  --   --   --   --   ALBUMIN <1.0* <1.0* <1.0* <1.0* <1.0*   Coagulation Profile:  Recent Labs Lab 11/13/2015 2105  INR 1.27    Cardiac Enzymes:  Recent Labs Lab 11/12/15 0231 11/12/15 1608 11/12/15 2120 11/13/15 0215 11/13/15 0732  TROPONINI 0.27* 0.16* 0.32* 0.09* 0.66*   CBG:  Recent Labs Lab 11/12/15 1547 11/12/15 2002 11/13/15 0023 11/13/15 0435 11/13/15 0811  GLUCAP 201* 199* 213* 193* 182*    Urine analysis:    Component Value Date/Time   COLORURINE YELLOW 10/29/2015 1020   APPEARANCEUR TURBID* 10/29/2015 1020   LABSPEC 1.026 10/29/2015 1020   PHURINE  5.0 10/29/2015 1020   GLUCOSEU NEGATIVE 10/29/2015 1020   HGBUR MODERATE* 10/29/2015 1020   BILIRUBINUR NEGATIVE 10/29/2015 1020   KETONESUR NEGATIVE 10/29/2015 1020   PROTEINUR 100* 10/29/2015 1020   UROBILINOGEN 1.0 08/20/2014 1042   NITRITE NEGATIVE 10/29/2015 1020   LEUKOCYTESUR MODERATE* 10/29/2015 1020    Recent Results (from the past 240 hour(s))  Surgical pcr screen     Status: None   Collection Time: 11/16/2015  4:43 AM  Result Value Ref Range Status   MRSA, PCR NEGATIVE NEGATIVE Final   Staphylococcus aureus NEGATIVE NEGATIVE Final    Comment:  The Xpert SA Assay (FDA approved for NASAL specimens in patients over 67 years of age), is one component of a comprehensive surveillance program.  Test performance has been validated by [facility] for patients greater than or equal to 64 year old. It is not intended to diagnose infection nor to guide or monitor treatment.      Scheduled Meds: . sodium chloride   Intravenous Once  . ampicillin-sulbactam (UNASYN) IV  3 g Intravenous Q8H  . antiseptic oral rinse  7 mL Mouth Rinse QID  . aspirin  325 mg Oral QHS  . atorvastatin  80 mg Oral q1800  . carvedilol  6.25 mg Oral BID WC  . cholecalciferol  2,000 Units Oral Daily  . collagenase   Topical Daily  . darbepoetin (ARANESP) injection - NON-DIALYSIS  60 mcg Subcutaneous Q14 Days  . docusate sodium  100 mg Oral BID  . feeding supplement  1 Container Oral TID BM  . furosemide  100 mg Intravenous Q6H  . heparin subcutaneous  5,000 Units Subcutaneous Q8H  . insulin aspart  0-15 Units Subcutaneous Q4H  . multivitamin with minerals  1 tablet Oral Daily  . pantoprazole (PROTONIX) IV  40 mg Intravenous Q24H  . thiamine  100 mg Oral Daily   Continuous Infusions: . sodium chloride Stopped (11/10/2015 1516)     LOS: 18 days   Time spent: 35 minutes   Lonia Blood, MD [facility] Office  [phone] Pager - Text Page per Loretha Stapler as per  below:  On-Call/Text Page:      [email or web address]      password [email or web address]  If 7PM-7AM, please contact night-coverage [email or web address] Password [email or web address] 11/13/2015, 9:58 AM

## 2015-11-13 NOTE — Progress Notes (Signed)
[facility] Rounding Note  Subjective: Interval History:  Extubated Worried about not being able to move left leg, moving right a little better  Objective: Vital signs in last 24 hours: Temp:  [98.2 F (36.8 C)-98.7 F (37.1 C)] 98.4 F (36.9 C) (05/08 0812) Pulse Rate:  [78-90] 79 (05/08 0812) Resp:  [0-24] 16 (05/08 0812) BP: (112-157)/(61-99) 145/90 mmHg (05/08 0812) SpO2:  [96 %-100 %] 100 % (05/08 0812) Weight:  [91.627 kg (202 lb)] 91.627 kg (202 lb) (05/08 0350) Weight change: 6.727 kg (14 lb 13.3 oz)  Intake/Output from previous day: 05/07 0701 - 05/08 0700 In: 880 [P.O.:240; I.V.:220; IV Piggyback:420] Out: 2175 [Urine:1475; Stool:700] Intake/Output this shift:   Physical Examination BP 160/88 mmHg  Pulse 81  Temp(Src) 97.7 F (36.5 C) (Oral)  Resp 23  Ht 5\' 7"  (1.702 m)  Wt 91.627 kg (202 lb)  BMI 31.63 kg/m2  SpO2 100% General: NAD Pulm: CTAB CV: RRR GI: colostomy in place + abdominal wall edema Extremities: diffuse edema, arms, legs and scrotum   Lab Results:  Recent Labs  11/12/15 0231 11/13/15 0215  WBC 24.9* 19.0*  HGB 8.1* 7.9*  HCT 25.8* 24.8*  PLT 517* 491*     Recent Labs  11/12/15 0231 11/13/15 0215  NA 135 135  K 3.6 3.4*  CL 108 107  CO2 15* 16*  GLUCOSE 209* 235*  BUN 43* 43*  CREATININE 2.28* 2.05*  CALCIUM 7.6* 7.6*    Scheduled Medications: . sodium chloride   Intravenous Once  . ampicillin-sulbactam (UNASYN) IV  3 g Intravenous Q8H  . antiseptic oral rinse  7 mL Mouth Rinse QID  . aspirin  325 mg Oral QHS  . atorvastatin  80 mg Oral q1800  . carvedilol  6.25 mg Oral BID WC  . cholecalciferol  2,000 Units Oral Daily  . collagenase   Topical Daily  . darbepoetin (ARANESP) injection - NON-DIALYSIS  60 mcg Subcutaneous Q14 Days  . docusate sodium  100 mg Oral BID  . feeding supplement  1 Container Oral TID BM  . furosemide  100 mg Intravenous Q6H  . heparin subcutaneous  5,000 Units Subcutaneous Q8H  . insulin aspart   0-15 Units Subcutaneous Q4H  . multivitamin with minerals  1 tablet Oral Daily  . pantoprazole (PROTONIX) IV  40 mg Intravenous Q24H  . thiamine  100 mg Oral Daily    Background Pt is a 40 y.o. yo male with uncontrolled DM who was admitted on 10/22/2015 with AoCKD3 in the setting of I/D left gluteal abscess/sepsis, diarrhea and s/p PEA arrest in the OR at time of diverting colostomy placement.Baseline creatinine 1.5-1.8. Felt ischemic ATN secondary to hypoperfusion. Peak creatinine 2.4, marked volume overload 2/2 fluid rescusitation  Assessment/Plan: 1. AoCKD3 - baseline cr 1.5-1.8, now 2.05<<2.34. UOP improving with diuresis 2. Anemia- s/post transfusion  3. Anasarca -  CXR 5/6 with diffuse pulmonary edema. Needs continued diuresis. Anasarca on exam. If believable weight up 20-30 kg. Lasix 100 Q6H. Assess need for higher dosing 4. Hypokalemia - KDur 40 BID and will reassess needs daily 5. 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 CP/NSTEMI - w/u down the road   Haney,Alyssa 11/13/2015,8:41 AM   I have seen and examined this patient and agree with plan and assessment in the above note with highlighted additions. From a renal standpoint, kidney function is improving, biggest renal issue is massive anasarca as a consequence of fluid rescuscitation during this hospitalization. Weight up significantly (difficult to know if  weights correct with 30 kg increase but clearly exam reflects a LOT of volume)  Currently have increased lasix to 100 mg every 6 hours and may require further increase. Has plenty of BP now to support diuresis . Jagjit Riner B,MD 11/13/2015 5:30 PM

## 2015-11-13 NOTE — Progress Notes (Signed)
4 Days Post-Op  Subjective: POD 4 s/p diverting loop colostomy c/b cardiac event requiring CPR.  NAE overnight.  Started on CLD yesterday which he tolerated well.  Complains this morning of some "chest pain" that he states felt like his esophagus was full and some RLQ pain.  States that these have resolved, but presented for a short interval last night.      Objective: Vital signs in last 24 hours: Temp:  [98.2 F (36.8 C)-98.7 F (37.1 C)] 98.4 F (36.9 C) (05/08 0812) Pulse Rate:  [78-90] 79 (05/08 0812) Resp:  [0-24] 16 (05/08 0812) BP: (112-157)/(61-99) 145/90 mmHg (05/08 0812) SpO2:  [96 %-100 %] 100 % (05/08 0812) Weight:  [91.627 kg (202 lb)] 91.627 kg (202 lb) (05/08 0350) Last BM Date: 11/12/15  Intake/Output from previous day: 05/07 0701 - 05/08 0700 In: 880 [P.O.:240; I.V.:220; IV Piggyback:420] Out: 2175 [Urine:1475; Stool:700] Intake/Output this shift:    General appearance: alert, cooperative and no distress GI: Soft.  Nontender.  Incisions healing well.  Ostomy in LLQ with red rubber tubing under loop.  Air and stool in appliance. Incision/Wound: Sacral wound with some necrotic fat at inferior portion upper portion appears clean.  No obvious purulent drainage.  Lab Results:   Recent Labs  11/12/15 0231 11/13/15 0215  WBC 24.9* 19.0*  HGB 8.1* 7.9*  HCT 25.8* 24.8*  PLT 517* 491*   BMET  Recent Labs  11/12/15 0231 11/13/15 0215  NA 135 135  K 3.6 3.4*  CL 108 107  CO2 15* 16*  GLUCOSE 209* 235*  BUN 43* 43*  CREATININE 2.28* 2.05*  CALCIUM 7.6* 7.6*   PT/INR No results for input(s): LABPROT, INR in the last 72 hours. ABG  Recent Labs  11/10/15 1005  PHART 7.439  HCO3 17.6*    Studies/Results: No results found.  Anti-infectives: Anti-infectives    Start     Dose/Rate Route Frequency Ordered Stop   11/03/15 1200  Ampicillin-Sulbactam (UNASYN) 3 g in sodium chloride 0.9 % 100 mL IVPB     3 g 100 mL/hr over 60 Minutes Intravenous  Every 8 hours 11/03/15 1104     10/31/15 1300  ampicillin-sulbactam (UNASYN) 1.5 g in sodium chloride 0.9 % 50 mL IVPB  Status:  Discontinued     1.5 g 100 mL/hr over 30 Minutes Intravenous Every 12 hours 10/31/15 1214 11/03/15 1104   10/29/15 2000  metroNIDAZOLE (FLAGYL) IVPB 500 mg  Status:  Discontinued     500 mg 100 mL/hr over 60 Minutes Intravenous Every 8 hours 10/29/15 1852 10/30/15 1312   10/29/15 0800  vancomycin (VANCOCIN) IVPB 750 mg/150 ml premix  Status:  Discontinued     750 mg 150 mL/hr over 60 Minutes Intravenous Every 24 hours 10/28/15 1058 10/31/15 0750   10/17/2015 0800  vancomycin (VANCOCIN) 500 mg in sodium chloride 0.9 % 100 mL IVPB  Status:  Discontinued     500 mg 100 mL/hr over 60 Minutes Intravenous Every 12 hours 10/30/2015 1931 10/28/15 1058   10/24/2015 0200  piperacillin-tazobactam (ZOSYN) IVPB 3.375 g  Status:  Discontinued     3.375 g 12.5 mL/hr over 240 Minutes Intravenous Every 8 hours 10/30/2015 1938 10/31/15 1205   10/15/2015 2000  piperacillin-tazobactam (ZOSYN) IVPB 3.375 g     3.375 g 100 mL/hr over 30 Minutes Intravenous  Once 10/11/2015 1903 10/25/2015 2117   10/25/2015 2000  vancomycin (VANCOCIN) IVPB 1000 mg/200 mL premix     1,000 mg 200 mL/hr over 60 Minutes  Intravenous  Once 11/05/2015 1903 10/25/2015 2147      Assessment/Plan: s/p Procedure(s): LAPAROSCOPIC DIVERTED COLOSTOMY (N/A) Ostomy now functioning well.  Tolerated clears and fulls, will advance to regular.  Unsure of the "full esophagus and RLQ pain" as they appear to have resolved.  Will monitor. WBC improving.  Continue with wet to dry dressing changes BID to posterior wound and hydrotherapy.   LOS: 18 days    Concha Se 11/13/2015

## 2015-11-13 NOTE — Progress Notes (Signed)
CSW continuing to follow for eventual transfer to SNF  Shane Dougherty, [facility] Clinical Social Worker [phone]

## 2015-11-13 NOTE — Progress Notes (Signed)
Physical Therapy Treatment Patient Details Name: Shane Dougherty MRN: [id number] DOB: 18-Jun-1976 Today's Date: 11/13/2015    History of Present Illness Pt is a 40 y/o male who presents s/p I&D of L gluteal abscess. Pt had been seen in ED a few days prior to admission and also had an I&D done at that time.PMH significant for DM. MRI 4/24 revealed Multiple small foci of restricted diffusion in both cerebral hemispheres most consistent with embolic infarctions. s/p PEA arrest in the OR for diverting colostomy placement.     PT Comments    Pt admitted with above diagnosis. Pt currently with functional limitations due to balance and endurance deficits. Pt has not met 0/4 goals set on 4/22 due to multiple complications since admission to include sacral ulcer, new colostomy, CVAs and PEA arrest requiring vent for a brief time.  Has not been seen by PT since 5/1 due to these complications.  Goals revised today.  Feel that d/c plan has changed as well as pt will need a longer rehab stay therefore SNF recommended.  Pt was able to sit EOB today for 4 minutes with mod to total assist.  SEverely deconditioned and limited by pain in buttocks.  Pt will benefit from skilled PT to increase their independence and safety with mobility to allow discharge to the venue listed below.    Follow Up Recommendations  SNF;Supervision/Assistance - 24 hour     Equipment Recommendations  Rolling walker with 5" wheels;3in1 (PT)    Recommendations for Other Services       Precautions / Restrictions Precautions Precautions: Fall Restrictions Weight Bearing Restrictions: No    Mobility  Bed Mobility Overal bed mobility: Needs Assistance Bed Mobility: Supine to Sit;Sit to Supine     Supine to sit: Max assist;HOB elevated Sit to supine: Total assist;+2 for physical assistance   General bed mobility comments: tactile and verbal cues for hand placement and sequencing with physical assist to bring bilat LE to EOB, reach  and hold onto bed rail, and elevate trunk into sitting; pt returned to supine with tot A +2 to elevate bilat LE into bed and position   Transfers                 General transfer comment: Unable  Ambulation/Gait             General Gait Details: Unable   Stairs            Wheelchair Mobility    Modified Rankin (Stroke Patients Only)       Balance Overall balance assessment: Needs assistance Sitting-balance support: Bilateral upper extremity supported;Feet supported Sitting balance-Leahy Scale: Poor Sitting balance - Comments: mod to total assist to sit EOB 4 minutes.  Pt with posterior lean and limited by pain.  Pt also complaining of scrotal pain and tried to adjust pt as scrotum swollen.   Postural control: Posterior lean;Left lateral lean                          Cognition Arousal/Alertness: Awake/alert Behavior During Therapy: Flat affect Overall Cognitive Status: Within Functional Limits for tasks assessed                      Exercises General Exercises - Lower Extremity Ankle Circles/Pumps: Both;Supine;PROM;5 reps Heel Slides: AAROM;Both;5 reps;Supine Hip ABduction/ADduction: AAROM;Both;5 reps;Supine Straight Leg Raises: AAROM;Both;5 reps;Supine    General Comments General comments (skin integrity, edema, etc.): educated pt on  bil UE and LE positioning.       Pertinent Vitals/Pain Pain Assessment: Faces Faces Pain Scale: Hurts worst Pain Location: buttocks and back Pain Descriptors / Indicators: Aching;Grimacing;Guarding Pain Intervention(s): Limited activity within patient's tolerance;Monitored during session;Premedicated before session;Repositioned  VSS    Home Living                      Prior Function            PT Goals (current goals can now be found in the care plan section) Acute Rehab PT Goals PT Goal Formulation: With patient Time For Goal Achievement: 11/27/15 Potential to Achieve Goals:  Fair Progress towards PT goals: Not progressing toward goals - comment;Goals downgraded-see care plan (very slow progress toward goals.  )    Frequency  Min 2X/week    PT Plan Discharge plan needs to be updated;Frequency needs to be updated    Co-evaluation             End of Session Equipment Utilized During Treatment: Oxygen Activity Tolerance: No increased pain;Patient limited by fatigue Patient left: in bed;with bed alarm set;with call bell/phone within reach;with family/visitor present     Time: 9833-8250 PT Time Calculation (min) (ACUTE ONLY): 20 min  Charges:  $Therapeutic Activity: 8-22 mins                    G CodesDenice Paradise Dec 05, 2015, 4:51 PM [employer] Acute Rehabilitation [phone] (pager)

## 2015-11-13 NOTE — Progress Notes (Signed)
NIF -40 , VC 930, best of three attempts. Vitals stable at HR 84, sat 100%, RR 24.

## 2015-11-13 NOTE — Consult Note (Signed)
WOC ostomy follow up Stoma type/location: end colstomy Stomal assessment/size: aprox. 1 3/8/" round, budded, pink, moist Peristomal assessment: pouch intact today Treatment options for stomal/peristomal skin: NA Output liquid green stool Ostomy pouching: 2pc intact, supplies ordered.  Education provided: patient was getting ready to have hydrotherapy for his wounds when WOC arrived.  Introduced myself and role of Geneticist, molecular.  Patient is aware of ostomy surgery, asked "will they change this back when I leave the hospital?"  I have explained that reversal would not be done until patient's wounds have healed and his surgery team feels he is ready for surgery.  He verbalized understanding.  Enrolled patient in [place] Secure Start Discharge program: No  WOC will follow along with you for continued support with ostomy teaching and care Kendelle Schweers [place] RN,CWOCN [phone]

## 2015-11-13 NOTE — NC FL2 (Signed)
[place] MEDICAID FL2 LEVEL OF CARE SCREENING TOOL     IDENTIFICATION  Patient Name: Shane Dougherty Birthdate: 03/17/1976 Sex: male Admission Date (Current Location): 10/30/2015  [facility] and [place] Number:  Producer, television/film/video and Address:         Provider Number: [id number]  Attending Physician Name and Address:  Lonia Blood, MD  Relative Name and Phone Number:  Harriett Sine [phone]    Current Level of Care: Hospital Recommended Level of Care: Skilled Nursing Facility Prior Approval Number:    Date Approved/Denied:   PASRR Number: [id number]  Discharge Plan: SNF    Current Diagnoses: Patient Active Problem List   Diagnosis Date Noted  . Acute respiratory failure (HCC)   . Cardiac arrest (HCC)   . Chest pain   . Endotracheal tube present   . Stroke (HCC)   . Perirectal abscess   . Abscess, gluteal, left   . Precordial pain   . NSTEMI (non-ST elevated myocardial infarction) (HCC)   . Pericardial effusion   . Pressure ulcer 10/31/2015  . AKI (acute kidney injury) (HCC) 10/29/2015  . Peri-rectal abscess 10/27/2015  . CKD (chronic kidney disease) 11/01/2015  . Leukocytosis 10/22/2015  . Loss of weight 05/11/2015  . Pain in joint, lower leg 04/26/2015  . Diabetic neuropathy, type II diabetes mellitus (HCC) 04/26/2015  . Erectile dysfunction 04/12/2015  . Essential hypertension 04/06/2015  . Facial cellulitis 04/03/2015  . Hyperglycemia due to type 2 diabetes mellitus (HCC)   . Hyponatremia   . Absolute anemia     Orientation RESPIRATION BLADDER Height & Weight     Self, Time, Situation, Place  O2 (2L [place]) Indwelling catheter Weight: 202 lb (91.627 kg) Height:  5\' 7"  (170.2 cm)  BEHAVIORAL SYMPTOMS/MOOD NEUROLOGICAL BOWEL NUTRITION STATUS      Colostomy (placed 5/5) Diet  AMBULATORY STATUS COMMUNICATION OF NEEDS Skin   Extensive Assist Verbally Surgical wounds (collestomy site)                       Personal Care Assistance Level of  Assistance  Bathing, Feeding, Dressing Bathing Assistance: Maximum assistance Feeding assistance: Independent Dressing Assistance: Maximum assistance     Functional Limitations Info             SPECIAL CARE FACTORS FREQUENCY  PT (By licensed PT), OT (By licensed OT)     PT Frequency: 5/wk OT Frequency: 5/wk            Contractures      Additional Factors Info  Code Status, Allergies, Insulin Sliding Scale Code Status Info: Full Allergies Info: Bee Venom   Insulin Sliding Scale Info: insulin aspart (novoLOG) injection 0-9 Units;insulin glargine (LANTUS) injection 15 Units       Current Medications (11/13/2015):  This is the current hospital active medication list Current Facility-Administered Medications  Medication Dose Route Frequency Provider Last Rate Last Dose  . 0.9 %  sodium chloride infusion   Intravenous Continuous Sherrian Divers, MD   Stopped at 11/24/2015 1516  . 0.9 %  sodium chloride infusion   Intravenous Once Nelda Bucks, MD      . Ampicillin-Sulbactam (UNASYN) 3 g in sodium chloride 0.9 % 100 mL IVPB  3 g Intravenous Q8H Darl Householder Masters, RPH   3 g at 11/13/15 0865  . antiseptic oral rinse solution (CORINZ)  7 mL Mouth Rinse QID Jeanella Craze, NP   7 mL at 11/12/15 2101  . aspirin tablet 325  mg  325 mg Oral QHS Harriette Bouillon, MD   325 mg at 11/12/15 2058  . atorvastatin (LIPITOR) tablet 80 mg  80 mg Oral q1800 Chrystie Nose, MD   80 mg at 11/12/15 1805  . carvedilol (COREG) tablet 6.25 mg  6.25 mg Oral BID WC Penny Pia, MD   6.25 mg at 11/12/15 1806  . cholecalciferol (VITAMIN D) tablet 2,000 Units  2,000 Units Oral Daily Delyle Art, DO   2,000 Units at 11/12/15 1047  . collagenase (SANTYL) ointment   Topical Daily Manus Rudd, MD      . Darbepoetin Alfa (ARANESP) injection 60 mcg  60 mcg Subcutaneous Q14 Days Terrial Rhodes, MD   60 mcg at 11/03/15 1607  . docusate sodium (COLACE) capsule 100 mg  100 mg Oral BID Byrd Art, DO   100  mg at 11/12/15 2057  . feeding supplement (BOOST / RESOURCE BREEZE) liquid 1 Container  1 Container Oral TID BM Gaynelle Adu, MD   1 Container at 11/12/15 2053  . fentaNYL (SUBLIMAZE) injection 12.5 mcg  12.5 mcg Intravenous Q2H PRN Simonne Martinet, NP   12.5 mcg at 11/13/15 0412  . furosemide (LASIX) 100 mg in dextrose 5 % 50 mL IVPB  100 mg Intravenous Q6H Alyssa A Haney, MD      . gi cocktail (Maalox,Lidocaine,Donnatal)  30 mL Oral BID PRN Leda Gauze, NP      . heparin injection 5,000 Units  5,000 Units Subcutaneous Q8H Emi Holes, RPH   5,000 Units at 11/13/15 4098  . insulin aspart (novoLOG) injection 0-15 Units  0-15 Units Subcutaneous Q4H Bernadene Person, NP   3 Units at 11/13/15 0445  . multivitamin with minerals tablet 1 tablet  1 tablet Oral Daily Penny Pia, MD   1 tablet at 11/12/15 1047  . nitroGLYCERIN (NITROSTAT) SL tablet 0.4 mg  0.4 mg Sublingual Q5 min PRN Renae Fickle, MD   0.4 mg at 11/08/15 1915  . ondansetron (ZOFRAN) tablet 4 mg  4 mg Oral Q6H PRN Amariyon Art, DO       Or  . ondansetron (ZOFRAN) injection 4 mg  4 mg Intravenous Q6H PRN Vahan Art, DO   4 mg at 10/28/15 0104  . pantoprazole (PROTONIX) injection 40 mg  40 mg Intravenous Q24H Karl Ito, MD   40 mg at 11/12/15 2058  . senna-docusate (Senokot-S) tablet 1 tablet  1 tablet Oral QHS PRN Gracin Art, DO   1 tablet at 10/19/2015 2047  . thiamine (VITAMIN B-1) tablet 100 mg  100 mg Oral Daily Darl Householder Masters, RPH   100 mg at 11/12/15 1047     Discharge Medications: Please see discharge summary for a list of discharge medications.  Relevant Imaging Results:  Relevant Lab Results:   Additional Information SSN: [id number], [place], [place]

## 2015-11-13 NOTE — Progress Notes (Signed)
Physical Therapy Wound Treatment Patient Details  Name: Shane Dougherty MRN: [id number] Date of Birth: [birth year]  Today's Date: 11/13/2015 Time:  -     Subjective  Subjective: Pt pleasant and agreeable to hydrotherapy.  Patient and Family Stated Goals: Heal wound - get back to work Date of Onset: 10/14/15 Prior Treatments: I&D 11/11/2015, Colostomy 11/26/2015  Pain Score:  IV pain meds premedication. Pt reports discomfort during treatment.   Wound Assessment  Wound / Incision (Open or Dehisced) 11/08/15 Incision - Open Sacrum Mid (Active)  Dressing Type ABD;Barrier Film (skin prep);Gauze (Comment);Moist to dry 11/13/2015 10:03 AM  Dressing Changed Changed 11/13/2015 10:03 AM  Dressing Status Clean;Dry;Intact 11/13/2015 10:03 AM  Dressing Change Frequency Daily 11/13/2015 10:03 AM  Site / Wound Assessment Bleeding;Yellow;Brown;Red 11/13/2015 10:03 AM  % Wound base Red or Granulating 10% 11/13/2015 10:03 AM  % Wound base Yellow 80% 11/13/2015 10:03 AM  % Wound base Black 10% 11/13/2015 10:03 AM  Peri-wound Assessment Intact;Induration;Maceration 11/13/2015 10:03 AM  Wound Length (cm) 16.5 cm 11/08/2015  2:37 PM  Wound Width (cm) 9.2 cm 11/08/2015  2:37 PM  Wound Depth (cm) 3.2 cm 11/08/2015  2:37 PM  Undermining (cm) 2.6 at 10 o'clock, 1.5 at 3 o'clock, 1.6 at 4 o'clock 11/08/2015  2:37 PM  Margins Unattached edges (unapproximated) 11/13/2015 10:03 AM  Closure None 11/13/2015 10:03 AM  Drainage Amount Moderate 11/13/2015 10:03 AM  Drainage Description Sanguineous 11/13/2015 10:03 AM  Treatment Debridement (Selective);Hydrotherapy (Pulse lavage);Packing (Saline gauze) 11/13/2015 10:03 AM  Santyl applied to wound bed prior to applying dressing.   Hydrotherapy Pulsed lavage therapy - wound location: sacrum Pulsed Lavage with Suction (psi): 8 psi (For pain control) Pulsed Lavage with Suction - Normal Saline Used: 1000 mL Pulsed Lavage Tip: Tip with splash shield Selective Debridement Selective Debridement - Location:  sacrum Selective Debridement - Tools Used: Forceps;Scissors Selective Debridement - Tissue Removed: Yellow and brown necrotic tissue   Wound Assessment and Plan  Wound Therapy - Assess/Plan/Recommendations Wound Therapy - Clinical Statement: Pt will benefit from continued hydrotherapy to continue selective removal of necrotic tissue and decrease bioburden.  Wound Therapy - Functional Problem List: Decreased tolerance of OOB Factors Delaying/Impairing Wound Healing: Diabetes Mellitus;Incontinence Hydrotherapy Plan: Debridement;Dressing change;Pulsatile lavage with suction;Patient/family education Wound Therapy - Frequency: 6X / week Wound Therapy - Follow Up Recommendations: Skilled nursing facility Wound Plan: See above  Wound Therapy Goals- Improve the function of patient's integumentary system by progressing the wound(s) through the phases of wound healing (inflammation - proliferation - remodeling) by: Decrease Necrotic Tissue to: 25% Decrease Necrotic Tissue - Progress: Progressing toward goal Increase Granulation Tissue to: 75% Increase Granulation Tissue - Progress: Progressing toward goal Goals/treatment plan/discharge plan were made with and agreed upon by patient/family: Yes Time For Goal Achievement: 7 days Wound Therapy - Potential for Goals: Good  Goals will be updated until maximal potential achieved or discharge criteria met.  Discharge criteria: when goals achieved, discharge from hospital, MD decision/surgical intervention, no progress towards goals, refusal/missing three consecutive treatments without notification or medical reason.  GP     Rolinda Roan 11/13/2015, 10:07 AM  Rolinda Roan, PT, DPT Acute Rehabilitation Services Pager: [phone]

## 2015-11-13 NOTE — Progress Notes (Addendum)
CRITICAL VALUE ALERT  Critical value received:  Troponin 0.66  Date of notification:  11/13/15  Time of notification:  0948  Critical value read back: yes  Nurse who received alert:  Nickolas Madrid, RN  MD notified (Verbal):  Dr. Sharon Seller  Time: [phone]

## 2015-11-13 NOTE — Progress Notes (Signed)
NIF was -40 and VC 760 best attempt after several attempts.

## 2015-11-13 NOTE — Plan of Care (Signed)
Problem: Pain Management: Goal: General experience of comfort will improve Outcome: Progressing Patient complained of restless and cramping legs throughout the shift.  Discussed pain management plan with PCCM.

## 2015-11-13 NOTE — Progress Notes (Signed)
NIF - 40, VC 920 on best of 3 attempts. Good patient effort.

## 2015-11-14 ENCOUNTER — Inpatient Hospital Stay (HOSPITAL_COMMUNITY): Payer: Managed Care, Other (non HMO)

## 2015-11-14 ENCOUNTER — Inpatient Hospital Stay (HOSPITAL_COMMUNITY): Payer: Managed Care, Other (non HMO) | Admitting: Certified Registered Nurse Anesthetist

## 2015-11-14 LAB — BASIC METABOLIC PANEL
Anion gap: 14 (ref 5–15)
BUN: 42 mg/dL — AB (ref 6–20)
CALCIUM: 7.4 mg/dL — AB (ref 8.9–10.3)
CHLORIDE: 110 mmol/L (ref 101–111)
CO2: 17 mmol/L — AB (ref 22–32)
CREATININE: 2.04 mg/dL — AB (ref 0.61–1.24)
GFR calc Af Amer: 45 mL/min — ABNORMAL LOW (ref >60)
GFR calc non Af Amer: 39 mL/min — ABNORMAL LOW (ref >60)
Glucose, Bld: 179 mg/dL — ABNORMAL HIGH (ref 65–99)
Potassium: 3.4 mmol/L — ABNORMAL LOW (ref 3.5–5.1)
Sodium: 141 mmol/L (ref 135–145)

## 2015-11-14 LAB — LACTIC ACID, PLASMA: Lactic Acid, Venous: 4.2 mmol/L (ref 0.5–2.0)

## 2015-11-14 LAB — HEPATIC FUNCTION PANEL
ALK PHOS: 859 U/L — AB (ref 38–126)
ALT: 12 U/L — AB (ref 17–63)
AST: 45 U/L — AB (ref 15–41)
Bilirubin, Direct: 0.1 mg/dL (ref 0.1–0.5)
Indirect Bilirubin: 0.3 mg/dL (ref 0.3–0.9)
TOTAL PROTEIN: 6.4 g/dL — AB (ref 6.5–8.1)
Total Bilirubin: 0.4 mg/dL (ref 0.3–1.2)

## 2015-11-14 LAB — MAGNESIUM: MAGNESIUM: 1.7 mg/dL (ref 1.7–2.4)

## 2015-11-14 LAB — CBC
HCT: 27.8 % — ABNORMAL LOW (ref 39.0–52.0)
HEMATOCRIT: 27.5 % — AB (ref 39.0–52.0)
HEMOGLOBIN: 8.6 g/dL — AB (ref 13.0–17.0)
HEMOGLOBIN: 8.6 g/dL — AB (ref 13.0–17.0)
MCH: 25.6 pg — ABNORMAL LOW (ref 26.0–34.0)
MCH: 26.5 pg (ref 26.0–34.0)
MCHC: 30.9 g/dL (ref 30.0–36.0)
MCHC: 31.3 g/dL (ref 30.0–36.0)
MCV: 82.7 fL (ref 78.0–100.0)
MCV: 84.9 fL (ref 78.0–100.0)
PLATELETS: 447 10*3/uL — AB (ref 150–400)
Platelets: 410 10*3/uL — ABNORMAL HIGH (ref 150–400)
RBC: 3.36 MIL/uL — AB (ref 4.22–5.81)
RBC: 3.24 MIL/uL — ABNORMAL LOW (ref 4.22–5.81)
RDW: 15.9 % — ABNORMAL HIGH (ref 11.5–15.5)
RDW: 16 % — AB (ref 11.5–15.5)
WBC: 13.3 10*3/uL — AB (ref 4.0–10.5)
WBC: 12.9 10*3/uL — ABNORMAL HIGH (ref 4.0–10.5)

## 2015-11-14 LAB — RENAL FUNCTION PANEL
ANION GAP: 13 (ref 5–15)
Albumin: <1 g/dL — ABNORMAL LOW (ref 3.5–5.0)
BUN: 41 mg/dL — ABNORMAL HIGH (ref 6–20)
CALCIUM: 7.7 mg/dL — AB (ref 8.9–10.3)
CO2: 18 mmol/L — AB (ref 22–32)
CREATININE: 1.75 mg/dL — AB (ref 0.61–1.24)
Chloride: 108 mmol/L (ref 101–111)
GFR, EST AFRICAN AMERICAN: 54 mL/min — AB (ref >60)
GFR, EST NON AFRICAN AMERICAN: 47 mL/min — AB (ref >60)
Glucose, Bld: 149 mg/dL — ABNORMAL HIGH (ref 65–99)
PHOSPHORUS: 4.6 mg/dL (ref 2.5–4.6)
Potassium: 3.4 mmol/L — ABNORMAL LOW (ref 3.5–5.1)
SODIUM: 139 mmol/L (ref 135–145)

## 2015-11-14 LAB — GLUCOSE, CAPILLARY
GLUCOSE-CAPILLARY: 160 mg/dL — AB (ref 65–99)
Glucose-Capillary: 167 mg/dL — ABNORMAL HIGH (ref 65–99)
Glucose-Capillary: 117 mg/dL — ABNORMAL HIGH (ref 65–99)

## 2015-11-14 LAB — POCT I-STAT 3, ART BLOOD GAS (G3+)
ACID-BASE DEFICIT: 7 mmol/L — AB (ref 0.0–2.0)
Bicarbonate: 17.1 mEq/L — ABNORMAL LOW (ref 20.0–24.0)
O2 Saturation: 100 %
PH ART: 7.412 (ref 7.350–7.450)
TCO2: 18 mmol/L (ref 0–100)
pCO2 arterial: 26.6 mmHg — ABNORMAL LOW (ref 35.0–45.0)
pO2, Arterial: 208 mmHg — ABNORMAL HIGH (ref 80.0–100.0)

## 2015-11-14 LAB — TROPONIN I: TROPONIN I: 0.07 ng/mL — AB (ref <0.031)

## 2015-11-14 MED ORDER — SODIUM BICARBONATE 8.4 % IV SOLN
INTRAVENOUS | Status: AC
  Filled 2015-11-14: qty 50

## 2015-11-14 MED ORDER — DEXTROSE 5 % IV SOLN
0.0000 ug/min | INTRAVENOUS | Status: DC
  Administered 2015-11-14: 20 ug/min via INTRAVENOUS
  Filled 2015-11-14 (×2): qty 4

## 2015-11-14 MED ORDER — SODIUM CHLORIDE 0.9 % IV BOLUS (SEPSIS)
500.0000 mL | Freq: Once | INTRAVENOUS | Status: AC
  Administered 2015-11-14: 500 mL via INTRAVENOUS

## 2015-11-15 MED FILL — Medication: Qty: 1 | Status: AC

## 2015-11-17 ENCOUNTER — Telehealth: Payer: Self-pay

## 2015-11-17 NOTE — Telephone Encounter (Signed)
On 11/17/2015 I received a death certificate from [facility] (original). The death certificate is for burial. The patient is a patient of Doctor Tyson Alias. The death certificate will be taken to [facility] (2100) on Monday am (11/20/2015). On 2015-11-26 I received the death certificate back from Doctor Tyson Alias. I got the death certificate ready and called the funeral home to let them know the death certificate is ready for pickup.

## 2015-11-24 ENCOUNTER — Ambulatory Visit: Payer: Managed Care, Other (non HMO) | Admitting: Internal Medicine

## 2015-12-07 NOTE — Progress Notes (Signed)
RT note-Dr. Tyson Alias placed arterial line, unable to draw from. Right radial ABG obtained. RN aware.

## 2015-12-07 NOTE — Anesthesia Procedure Notes (Signed)
Procedure Name: Intubation Date/Time: 2015/12/07 8:30 AM Performed by: Everlene Balls T Pre-anesthesia Checklist: Patient identified, Emergency Drugs available, Suction available, Patient being monitored and Timeout performed Oxygen Delivery Method: Ambu bag Preoxygenation: Pre-oxygenation with 100% oxygen Ventilation: Mask ventilation without difficulty Laryngoscope Size: Mac, 4 and McGraph Grade View: Grade I Tube type: Oral Tube size: 7.5 mm Number of attempts: 1 Airway Equipment and Method: Stylet Placement Confirmation: ETT inserted through vocal cords under direct vision,  positive ETCO2,  CO2 detector and breath sounds checked- equal and bilateral Secured at: 22 cm Tube secured with: Tape Dental Injury: Teeth and Oropharynx as per pre-operative assessment

## 2015-12-07 NOTE — Discharge Summary (Signed)
NAME:  Shane Dougherty, Shane Dougherty                    ACCOUNT NO.:  MEDICAL RECORD NO.:  [id number]  LOCATION:                                 FACILITY:  PHYSICIAN:  Nelda Bucks, MD DATE OF BIRTH:  Oct 19, 1975  DATE OF ADMISSION: DATE OF DISCHARGE:                              DISCHARGE SUMMARY   DEATH SUMMARY  This is an unfortunate 40 year old, African American male, who initially did not see doctors as an outpatient much, but was admitted for a perirectal abscess which required drainage x2 in the operating room by the general surgeons.  Then, underwent a diverting colostomy secondary to concerns of wound healing and contamination just after surgery on the 3rd surgical intervention for the elective ostomy placement.  The patient suffered a cardiac arrest.  He had been having on and off chest pain with cardiology following with very unimpressive troponin values and post arrest with a bradycardic arrest leading to PEA.  He was alert and oriented on the ventilator machine and with hemodynamic stability with acute renal failure.  To note, he also has an embolic stroke noted during his hospitalization, which was seen by Neurology.  The patient was extubated postoperatively, after one night on the ventilator machine after that arrest, and progressed quite nicely into the step-down unit area where the hospitalist took over the care of the patient.  Cardiology was following, they were treating with nitroglycerin and beta blockade.  Renal was following because of acute renal failure and treating his volume status with Lasix and he then complained in the morning of 11/17/2015 to have some potentially GI symptoms for which he was treated with a GI cocktail and then found unfortunately later to have suffered a PEA arrest which was prolonged for approximately 17-20 minutes.  He was resuscitated and brought to the intensive care unit, remained to have agonal respirations, shock, and a horrible  neurologic assessment likely related to irreversible severe anoxic brain injury.  I had discussions with his poor family including his brother who is the medical decision maker for which we decided to continue supporting him; made him a DNR.  Neo-Synephrine was required in terms of support. Central lines were placed.  Arterial lines were placed.  We tried to support him maximally, and despite these efforts, the patient continued to have refractory shock and suffered cardiac arrest and expired.  FINAL DIAGNOSES UPON DEATH: 1. 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 2. Cardiogenic shock, post arrest. 3. Intermittent unclear chest pain with an ejection fraction to be     normal with some mild subtle regional wall motion changes on echo,     post 1st cardiac arrest. 4. Perirectal abscess status post drainage x2. 5. Status post elective ostomy placement. 6. Acute renal failure. 7. Embolic strokes. 8. Urosepsis. 9. Acute respiratory failure likely with aspiration.     Nelda Bucks, MD     DJF/MEDQ  D:  11/16/2015  T:  11/17/2015  Job:  [id number]

## 2015-12-07 NOTE — Procedures (Signed)
Central Venous Catheter Insertion Procedure Note Shane Dougherty [id number] 1975/12/13  Procedure: Insertion of Central Venous Catheter Indications: Assessment of intravascular volume, Drug and/or fluid administration, Frequent blood sampling and code  Procedure Details Consent: Unable to obtain consent because of emergent medical necessity. Time Out: Verified patient identification, verified procedure, site/side was marked, verified correct patient position, special equipment/implants available, medications/allergies/relevent history reviewed, required imaging and test results available.  Performed  Maximum sterile technique was used including antiseptics, cap, gloves, gown, hand hygiene, mask and sheet. Skin prep: Chlorhexidine; local anesthetic administered A antimicrobial bonded/coated triple lumen catheter was placed in the right femoral vein due to emergent situation using the Seldinger technique.  Evaluation Blood flow good Complications: No apparent complications Patient did tolerate procedure well. Chest X-ray ordered to verify placement.  CXR: normal.  Shane J. 11/24/2015, 10:00 AM  [place]  [name]. Tyson Alias, MD, FACP Pgr: [phone] [facility]

## 2015-12-07 NOTE — Progress Notes (Addendum)
PULMONARY / CRITICAL CARE MEDICINE   Name: Shane Dougherty MRN: [id number] DOB: 29-Nov-1975    ADMISSION DATE:  Oct 27, 2015 CONSULTATION DATE:    REFERRING MD:  5/4  CHIEF COMPLAINT:  Post arrest    SUBJECTIVE:  Called emergently to room for CPR in progress - RN reports pt complaining of chest pain at shift change, abdominal pain.  He was treated with NTG and GI cocktail for pain.  The patient was later noted to have VT arrest.  Coded approx 20-25 minutes with ROSC.   VITAL SIGNS: BP 144/87 mmHg  Pulse 85  Temp(Src) 97.7 F (36.5 C) (Oral)  Resp 30  Ht 5\' 7"  (1.702 m)  Wt 195 lb (88.451 kg)  BMI 30.53 kg/m2  SpO2 94%  HEMODYNAMICS:    VENTILATOR SETTINGS:    INTAKE / OUTPUT:  Intake/Output Summary (Last 24 hours) at 11/16/2015 0840 Last data filed at 11/11/2015 0700  Gross per 24 hour  Intake   1212 ml  Output   3650 ml  Net  -2438 ml    PHYSICAL EXAMINATION: General:  Chronically ill appearing male, post arrest Neuro:  Obtunded post arrest, pupils fixed HEENT:  Mm moist, no JVD Cardiovascular:  s1s2 rrr no MRG Lungs:  Intubated post arrest, bilateral breath sounds, rhonchi Abdomen:  Round, soft, incision c/d, pink ostomy with brown outpt  Musculoskeletal:  Warm and dry, scant BLE edema    LABS:  BMET  Recent Labs Lab 11/12/15 0231 11/13/15 0215 11/26/2015 0511  NA 135 135 139  K 3.6 3.4* 3.4*  CL 108 107 108  CO2 15* 16* 18*  BUN 43* 43* 41*  CREATININE 2.28* 2.05* 1.75*  GLUCOSE 209* 235* 149*    Electrolytes  Recent Labs Lab 11/06/2015 1605  11/11/15 0405 11/12/15 0231 11/13/15 0215 11/21/2015 0511  CALCIUM 6.8*  < > 7.3* 7.6* 7.6* 7.7*  MG 1.8  --  2.0  --   --   --   PHOS 5.7*  < > 6.7* 5.9* 5.5* 4.6  < > = values in this interval not displayed.  CBC  Recent Labs Lab 11/12/15 0231 11/13/15 0215 12/01/2015 0511  WBC 24.9* 19.0* 13.3*  HGB 8.1* 7.9* 8.6*  HCT 25.8* 24.8* 27.8*  PLT 517* 491* 447*    Coag's  Recent Labs Lab  11/13/2015 2105  INR 1.27    Sepsis Markers  Recent Labs Lab 11/06/2015 1606 11/16/2015 1610 11/10/2015 2105 11/10/15 0400 11/11/15 0405  LATICACIDVEN 0.9  --  0.8  --   --   PROCALCITON  --  3.15  --  4.49 3.67    ABG  Recent Labs Lab 11/11/2015 1458 11/10/15 1005  PHART 7.376 7.439  PCO2ART 30.7* 26.0*  PO2ART 182.0* 161.0*    Liver Enzymes  Recent Labs Lab 11/21/2015 1605  11/12/15 0231 11/13/15 0215 12/06/2015 0511  AST 31  --   --   --  45*  ALT 7*  --   --   --  12*  ALKPHOS 839*  --   --   --  859*  BILITOT 0.6  --   --   --  0.4  ALBUMIN <1.0*  < > <1.0* <1.0* <1.0*  <1.0*  < > = values in this interval not displayed.  Cardiac Enzymes  Recent Labs Lab 11/13/15 0732 11/13/15 1241 11/13/15 1930  TROPONINI 0.66* 0.09* 0.15*    Glucose  Recent Labs Lab 11/13/15 0435 11/13/15 2620 11/13/15 1339 11/13/15 1735 11/13/15 2125 12/05/2015 3559  GLUCAP 193* 182* 220* 255* 178* 160*    Imaging No results found.   STUDIES:  4/25 MRI brain >> Multiple small foci of restricted diffusion in both cerebral hemispheres most consistent with embolic infarctions. Echo 4/24 >> EF 55-60%, grade 2 diastolic dysfunction, PA pressure , small pericardial effusion  UE Duplex 4/26  >> no evidence of DVT LE Duplex 4/26 >> no evidence of DVT Carotid Doppler 4/26 >> no extracranial carotid artery stenosis  TEE 4/27 >> EF 60-65%, no evidence of vegetation, trivial pericardial effusion 2D echo 5/4 >> LV mild inferior hypokinesis, nml size, EF 55-60%, mild MR, PA peak 34, small pericardial effusion  CULTURES: 4/23 CDiff >> neg  4/26 BCx2 >> neg  4/21 abscess >> mod group B strep  ANTIBIOTICS: Unasyn 4/28  >>  SIGNIFICANT EVENTS: 4/21  OR for I&D L gluteal abscess  5/02  OR for sacral debridement  5/04  OR for diverting colostomy >> PEA arrest  5/07  Tx to SDU, TRH 5/09  VT arrest, ~25 minutes prior to ROSC  LINES/TUBES: ETT 5/4 >>5/5  DISCUSSION: 40 y/o  male with uncontrolled DM with complicated course r/t peri-rectal abscess, sepsis, Acute on chronic renal failure and 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 Hypoperfusion??.  Had brief intra-operative brady/PEA arrest with ROSC after 2-3 mins.  Returned to ICU on vent on 5/4. Extubated 5/5. Now awake and no sig c/o. Will adv activity. Cont PT/OT, abx and defer diet to surgical services. Ready for xfer to SDU. Will likely need SNF. Tx out of SDU 5/7.  AM 5/9, pt suffered VT arrest with approx 25 min downtime.     ASSESSMENT / PLAN:  PULMONARY Acute respiratory failure - post brief arrest.  Second arrest 5/9 with re-intubation.  Effusion rt P:   Now intubation, PRVC 8 cc/kg  Increased RR in setting of metabolic acidosis  ABG post arrest Aspiration precautions Monitor R effusion  CARDIOVASCULAR VT Arrest - 5/9, approx 25 min downtime with in hospital CPR PEA arrest - intra-op 5/4 Chest pain - intermittent during hospitalization  Small Pericardial Effusion Elevated troponin  HTN NSTEMI  P:  Place TLC, Aline Trend troponin  Cardiology following, appreciate input Hold BB/coreg, lipitor post code  RENAL Acute on CKD 3 - baseline Scr ~1.6-1.8, no hydro on renal US-->creatinine trending down Non-AG acidosis P:   Repeat CMP now  Renal following and managing IVFs Hold lasix 5/9 post arrest  Trend BMP / UOP Replace electrolytes as indicated    GASTROINTESTINAL Diarrhea - ongoing.  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  Some ?chrons disease  Severe protein calorie malnutrition  P:   Will need ongoing GI f/u as outpt  Surgery following  NPO for now post arrest  PPI for SUP  HEMATOLOGIC Anemia  P:  F/u CBC Repeat CBC now post arrest  SCD's    INFECTIOUS Leukocytosis - persistently elevated wbc  Peri-rectal abscess  P:   Unasyn started 4/28 >> (consider stop date 5/12 which would be 8d rx AFTER surgical intervention) Monitor fever curve /  WBC Cultures as above  WOC / PT for hydrotherapy  ENDOCRINE Uncontrolled DM   P:   SSI  Hold lantus for now while NPO   NEUROLOGIC Profound lower extremity weakness -  due to probable anterior spinal infarct and multiple embolic cerebral  Embolic CVA  P:   RASS goal: 0  Serial neuro exams Neurology previously following PT when able   FAMILY  -  Updates: brother updated at bedside post arrest 5/9.  Given prior history of arrest and prolonged arrest am 5/9, concerned he has a very poor prognosis.  Discussed concept of no further CPR with brother but he is alone and does not feel comfortable making decisions without input Father's input.  Recommended no further CPR in the event of arrest.  Will follow up with family.    - Inter-disciplinary family meet or Palliative Care meeting due by:  Day 7    Canary Brim, NP-C [facility] Pgr: [phone] or if no answer [phone] 11/25/2015, 8:40 AM   STAFF NOTE: I, Rory Percy, MD FACP have personally reviewed patient's available data, including medical history, events of note, physical examination and test results as part of my evaluation. I have discussed with resident/NP and other care providers such as pharmacist, RN and RRT. In addition, I personally evaluated patient and elicited key findings of: in arrest, just got rosc, no flale chest , per not reactive, coarse BS, no bleeding noted, post arrest pea overall started some VT about 20 min , to icu, stat pcxr, stat cvl, a line, no pressors at this stage, keep high MV on vent, etiology concerning ischemia with prior chest pain during hosp stay and slight reg wall changed inferior, could not cath per cards at that stage secondary to ARF, this is his second arrest unfortunately, will support vent, dc lasix, give volume, may need ct head, unlikely PE or a new cause with stuttering prior events not resolved, cards assessment, poor prognosis, d/w family, dnr established,. Neo to  map goals, correct k , concern is seveer anoxia, NOT a hypothermia candidate, collapse rt base just back on pcxr likley from code secretions, will increase peep, chest pt, ali?, get trop, if stable would consider CT head The patient is critically ill with multiple organ systems failure and requires high complexity decision making for assessment and support, frequent evaluation and titration of therapies, application of advanced monitoring technologies and extensive interpretation of multiple databases.   Critical Care Time devoted to patient care services described in this note is90 Minutes. This time reflects time of care of this signee: Rory Percy, MD FACP. This critical care time does not reflect procedure time, or teaching time or supervisory time of PA/NP/Med student/Med Resident etc but could involve care discussion time. Rest per NP/medical resident whose note is outlined above and that I agree with   [name]. Tyson Alias, MD, FACP Pgr: [phone]  Pulmonary & Critical Care 11/20/2015 10:30 AM

## 2015-12-07 NOTE — Progress Notes (Signed)
Chaplain visited with family after the Pt coded on 2c. Chaplain was with family during the family meeting.

## 2015-12-07 NOTE — Progress Notes (Signed)
RT note-RR decreased post ABG result.

## 2015-12-07 NOTE — Progress Notes (Signed)
RT responded to Code Blue, pt bag mask ventilated until intubation equipment prepared. Intubated by CRNA. Pt placed on vent. RT will continue to monitor.

## 2015-12-07 NOTE — Progress Notes (Signed)
PT Cancellation Note  Patient Details Name: Shane Dougherty MRN: [id number] DOB: 02/01/1976   Cancelled Treatment:    Reason Eval/Treat Not Completed: Medical issues which prohibited therapy. Discussed pt case with RN who states that pt coded prior to PT arrival and has been transferred to 32M. Will hold hydrotherapy treatment at this time and will continue to follow for medical readiness,   Conni Slipper 11/08/2015, 9:14 AM   Conni Slipper, PT, DPT Acute Rehabilitation Services Pager: [phone]

## 2015-12-07 NOTE — Progress Notes (Signed)
[facility] Rounding Note  Subjective: Interval History:  Extubated Actively being coded when went to assess him  Objective: Vital signs in last 24 hours: Temp:  [97.4 F (36.3 C)-98 F (36.7 C)] 97.7 F (36.5 C) (05/09 0755) Pulse Rate:  [80-112] 112 (05/09 0837) Resp:  [14-30] 15 (05/09 0837) BP: (132-199)/(72-100) 199/87 mmHg (05/09 0837) SpO2:  [94 %-100 %] 100 % (05/09 0837) FiO2 (%):  [100 %] 100 % (05/09 0837) Weight:  [88.451 kg (195 lb)] 88.451 kg (195 lb) (05/09 0500) Weight change: -3.175 kg (-7 lb)  Intake/Output from previous day: 05/08 0701 - 05/09 0700 In: 1212 [P.O.:714; IV Piggyback:498] Out: 3650 [Urine:3650] Intake/Output this shift:   Physical Examination BP 199/87 mmHg  Pulse 112  Temp(Src) 97.7 F (36.5 C) (Oral)  Resp 15  Ht 5\' 7"  (1.702 m)  Wt 88.451 kg (195 lb)  BMI 30.53 kg/m2  SpO2 100% General: CPR in progress  Lab Results:  Recent Labs  11/13/15 0215 11/29/2015 0511  WBC 19.0* 13.3*  HGB 7.9* 8.6*  HCT 24.8* 27.8*  PLT 491* 447*     Recent Labs  11/13/15 0215 12/02/2015 0511  NA 135 139  K 3.4* 3.4*  CL 107 108  CO2 16* 18*  GLUCOSE 235* 149*  BUN 43* 41*  CREATININE 2.05* 1.75*  CALCIUM 7.6* 7.7*    Scheduled Medications: . ampicillin-sulbactam (UNASYN) IV  3 g Intravenous Q8H  . antiseptic oral rinse  7 mL Mouth Rinse QID  . aspirin  325 mg Oral QHS  . cholecalciferol  2,000 Units Oral Daily  . collagenase   Topical Daily  . darbepoetin (ARANESP) injection - NON-DIALYSIS  60 mcg Subcutaneous Q14 Days  . docusate sodium  100 mg Oral BID  . feeding supplement  1 Container Oral TID BM  . heparin subcutaneous  5,000 Units Subcutaneous Q8H  . insulin aspart  0-20 Units Subcutaneous TID WC  . insulin aspart  0-5 Units Subcutaneous QHS  . insulin glargine  10 Units Subcutaneous QHS  . multivitamin with minerals  1 tablet Oral Daily  . pantoprazole  40 mg Oral Q1200  . sodium bicarbonate      . thiamine  100 mg Oral Daily     Background Pt is a 40 y.o. yo male with uncontrolled DM who was admitted on 10/16/2015 with AoCKD3 in the setting of I/D left gluteal abscess/sepsis, diarrhea and s/p PEA arrest in the OR at time of diverting colostomy placement.Baseline creatinine 1.5-1.8. Felt ischemic ATN secondary to hypoperfusion. Peak creatinine 2.4, marked volume overload 2/2 fluid resuscitation Now (5/9) status post cardiac asystole arrest s/p Code for approximately 20-25 minutes with ROSC. Transferred to the MICU at that time. Unclear cause of asystolic code, but tachypnea with O2 desaturation to 94 % and reported complaint of pleuritic chest pain, increases concern for PE VS MI (prior NSTEMI with PEA arrest at time of colostomy)  Assessment/Plan: 1. AoCKD3 - baseline cr 1.5-1.8, now 1.74<<2.05<<2.34. UOP was improving with lasix diuresis 2. Anemia- s/post transfusion  3. Anasarca -  CXR 5/6 with diffuse pulmonary edema. Needs continued diuresis. Anasarca on exam. If believable weight up 20-30 kg. In setting of recent  4. Hypokalemia - replete as needed 5. 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 CP/NSTEMI - w/u down the road   Haney,Alyssa 11/28/2015,9:21 AM   I have seen and examined this patient and agree with plan and assessment above. Events of the AM noted, pt now agonal and CCM does not  feel will survive. No renal interventions requested at this time (and of course lasix stopped - moot point now). Have spoken with Dr. Tyson Alias - he will call us back if issues arise that we can assist with. Will sign off at this time.  Ethyn Schetter B,MD 11/23/2015 10:05 AM

## 2015-12-07 NOTE — Progress Notes (Signed)
NIF and VC not done this AM due to patient now intubated.

## 2015-12-07 NOTE — Procedures (Signed)
ACLS  Arrived Vt , short resolved, PEA About 20 min  To rosc acls followed  [name]. Tyson Alias, MD, FACP Pgr: [phone] [facility]

## 2015-12-07 NOTE — Code Documentation (Signed)
CODE BLUE NOTE  Patient Name: Shane Dougherty   MRN: [id number]   Date of Birth/ Sex: 1975-08-04 , male      Admission Date: 11/02/2015  Attending Provider: Drema Dallas, MD  Primary Diagnosis: <principal problem not specified>    Indication: Pt was in his usual state of health until this AM, when he was noted to be V-Tach and then asystole. Code blue was subsequently called. At the time of arrival on scene, ACLS protocol was underway.    Technical Description:  - CPR performance duration:  15 minutes  - Was defibrillation or cardioversion used? No   - Was external pacer placed? No  - Was patient intubated pre/post CPR? No    Medications Administered: Y = Yes; Blank = No Amiodarone    Atropine    Calcium    Epinephrine  X 3  Lidocaine    Magnesium    Norepinephrine    Phenylephrine    Sodium bicarbonate  X 2  Vasopressin      Post CPR evaluation:  - Final Status - Was patient successfully resuscitated ? Yes - What is current rhythm? V-tach - What is current hemodynamic status? unstable   Miscellaneous Information:     - Primary team notified?  Yes  - Family Notified? Yes  - Additional notes/ transfer status: No        Beaulah Dinning, MD  11/28/2015, 8:24 AM

## 2015-12-07 NOTE — Anesthesia Postprocedure Evaluation (Signed)
Anesthesia Post Note  Patient: Shane Dougherty  Procedure(s) Performed: * No procedures listed *  Patient location during evaluation: ICU Anesthetic complications: no Comments: Pt intubated on 2C08 while CPR in progress. RT to setup ventilator and plan to transfer to ICU.    Last Vitals:  Filed Vitals:   11/11/2015 0755 11/17/2015 0837  BP: 144/87 199/87  Pulse: 85 112  Temp: 36.5 C   Resp: 30 15    Last Pain:  Filed Vitals:   11/13/2015 0852  PainSc: 8                  Everlene Balls T

## 2015-12-07 NOTE — Progress Notes (Signed)
5 Days Post-Op  Subjective: POD 5 s/p diverting loop colostomy c/b cardiac event requiring CPR.  Diet advanced yesterday.  Complained of chest pain last night and burning in throat and burping.  Nitro and fentanyl attempted with no resolution of pain.  GI cocktail attempted with some improvement.  This morning states chest hurts with deep inspiration.  Objective: Vital signs in last 24 hours: Temp:  [97.4 F (36.3 C)-98.4 F (36.9 C)] 97.4 F (36.3 C) (05/09 0355) Pulse Rate:  [79-94] 94 (05/09 0710) Resp:  [14-30] 30 (05/09 0710) BP: (132-173)/(72-100) 144/87 mmHg (05/09 0710) SpO2:  [98 %-100 %] 98 % (05/09 0710) Weight:  [88.451 kg (195 lb)] 88.451 kg (195 lb) (05/09 0500) Last BM Date: 11/13/15 (colostomy)  Intake/Output from previous day: 05/08 0701 - 05/09 0700 In: 1212 [P.O.:714; IV Piggyback:498] Out: 3650 [Urine:3650] Intake/Output this shift:    General appearance: alert, cooperative, appears older than stated age and mild distress GI: Soft, nontender.  Nondistended.  Ostomy in place in LLQ with appliance full of stool and air. Incision/Wound: Incisions healing well.  No evidence of infection.  Sacral/perineal wound with some necrotic debride on base with lateral edges appearing to have some healthier appearing fat.  Upper portion of the wound appears to be developing continued pressure changes.  Lab Results:   Recent Labs  11/13/15 0215 11/27/2015 0511  WBC 19.0* 13.3*  HGB 7.9* 8.6*  HCT 24.8* 27.8*  PLT 491* 447*   BMET  Recent Labs  11/13/15 0215 12/02/2015 0511  NA 135 139  K 3.4* 3.4*  CL 107 108  CO2 16* 18*  GLUCOSE 235* 149*  BUN 43* 41*  CREATININE 2.05* 1.75*  CALCIUM 7.6* 7.7*   PT/INR No results for input(s): LABPROT, INR in the last 72 hours. ABG No results for input(s): PHART, HCO3 in the last 72 hours.  Invalid input(s): PCO2, PO2  Studies/Results: No results found.  Anti-infectives: Anti-infectives    Start     Dose/Rate Route  Frequency Ordered Stop   11/03/15 1200  Ampicillin-Sulbactam (UNASYN) 3 g in sodium chloride 0.9 % 100 mL IVPB     3 g 100 mL/hr over 60 Minutes Intravenous Every 8 hours 11/03/15 1104     10/31/15 1300  ampicillin-sulbactam (UNASYN) 1.5 g in sodium chloride 0.9 % 50 mL IVPB  Status:  Discontinued     1.5 g 100 mL/hr over 30 Minutes Intravenous Every 12 hours 10/31/15 1214 11/03/15 1104   10/29/15 2000  metroNIDAZOLE (FLAGYL) IVPB 500 mg  Status:  Discontinued     500 mg 100 mL/hr over 60 Minutes Intravenous Every 8 hours 10/29/15 1852 10/30/15 1312   10/29/15 0800  vancomycin (VANCOCIN) IVPB 750 mg/150 ml premix  Status:  Discontinued     750 mg 150 mL/hr over 60 Minutes Intravenous Every 24 hours 10/28/15 1058 10/31/15 0750   10/17/2015 0800  vancomycin (VANCOCIN) 500 mg in sodium chloride 0.9 % 100 mL IVPB  Status:  Discontinued     500 mg 100 mL/hr over 60 Minutes Intravenous Every 12 hours 10/25/2015 1931 10/28/15 1058   10/24/2015 0200  piperacillin-tazobactam (ZOSYN) IVPB 3.375 g  Status:  Discontinued     3.375 g 12.5 mL/hr over 240 Minutes Intravenous Every 8 hours 10/10/2015 1938 10/31/15 1205   11/04/2015 2000  piperacillin-tazobactam (ZOSYN) IVPB 3.375 g     3.375 g 100 mL/hr over 30 Minutes Intravenous  Once 10/13/2015 1903 10/28/2015 2117   11/05/2015 2000  vancomycin (VANCOCIN) IVPB 1000  mg/200 mL premix     1,000 mg 200 mL/hr over 60 Minutes Intravenous  Once 10/07/2015 1903 10/09/2015 2147      Assessment/Plan: s/p Procedure(s): LAPAROSCOPIC DIVERTED COLOSTOMY (N/A) Given his description of the pain being pleuritic and a continued cough, will start evaluation of chest pain with ASAP chest x ray.  Could be GERD, therefore agree with continued attempt with GI cocktail.  If CXR negative would consider Chest CT to rule out PE.  Wound appears to be stable, continue with hydrotherapy.   q2hour turns to decrease pressure on sacrum. Ostomy functioning well, continue with regular diet and  ostomy teaching.  LOS: 19 days    Concha Se 12/05/2015

## 2015-12-07 NOTE — Transfer of Care (Signed)
Immediate Anesthesia Transfer of Care Note  Patient: Shane Dougherty  Procedure(s) Performed: Intubation Patient Location: ICU  Anesthesia Type:intubation  Level of Consciousness: unresponsive, CPR in progress  Airway & Oxygen Therapy: Patient placed on Ventilator (see vital sign flow sheet for setting)  Post-op Assessment: Report given to RN  Post vital signs: unstable  Last Vitals:  Filed Vitals:   11/22/2015 0755 11/21/2015 0837  BP: 144/87 199/87  Pulse: 85 112  Temp: 36.5 C   Resp: 30 15    Last Pain:  Filed Vitals:   11/20/2015 0852  PainSc: 8       Patients Stated Pain Goal: 0 (11/13/15 1805)  Complications: No apparent anesthesia complications

## 2015-12-07 NOTE — Progress Notes (Signed)
Patient ID: Shane Dougherty, male   DOB: 06/11/1976, 40 y.o.   MRN: [id number]    Subjective:  Asked to see patient post code. No documentation of rhythm on floor In 79m now discussed with Dr Brooks Sailors.  Rhythm now SR/ST.  Prolonged Code over 25 minutes. 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  Objective:  Filed Vitals:   11/20/2015 0755 11/12/2015 0830 11/12/2015 0837 12/05/2015 0900  BP: 144/87  199/87   Pulse: 85  112   Temp: 97.7 F (36.5 C)     TempSrc: Oral     Resp: 30  15   Height:      Weight:      SpO2: 94% 100% 100% 100%    Intake/Output from previous day:  Intake/Output Summary (Last 24 hours) at 11/06/2015 0959 Last data filed at 11/11/2015 0700  Gross per 24 hour  Intake   1092 ml  Output   3300 ml  Net  -2208 ml    Physical Exam: Intubated Non responsive S1/S2 no murmur  No BS On vent/pressors Neuro- dilated pupils not moving extremities  Lab Results: Basic Metabolic Panel:  Recent Labs  48/88/91 0215 12/05/2015 0511  NA 135 139  K 3.4* 3.4*  CL 107 108  CO2 16* 18*  GLUCOSE 235* 149*  BUN 43* 41*  CREATININE 2.05* 1.75*  CALCIUM 7.6* 7.7*  PHOS 5.5* 4.6   Liver Function Tests:  Recent Labs  11/13/15 0215 11/26/2015 0511  AST  --  45*  ALT  --  12*  ALKPHOS  --  859*  BILITOT  --  0.4  PROT  --  6.4*  ALBUMIN <1.0* <1.0*  <1.0*    CBC:  Recent Labs  11/18/2015 0511 11/27/2015 0929  WBC 13.3* 12.9*  HGB 8.6* 8.6*  HCT 27.8* 27.5*  MCV 82.7 84.9  PLT 447* 410*   Cardiac Enzymes:  Recent Labs  11/13/15 0732 11/13/15 1241 11/13/15 1930  TROPONINI 0.66* 0.09* 0.15*   BNP: Invalid input(s): POCBNP D-Dimer: No results for input(s): DDIMER in the last 72 hours. Hemoglobin A1C: No results for input(s): HGBA1C in the last 72 hours. Fasting Lipid Panel: No results for input(s): CHOL, HDL, LDLCALC, TRIG, CHOLHDL, LDLDIRECT in the last 72 hours. Thyroid  Function Tests: No results for input(s): TSH, T4TOTAL, T3FREE, THYROIDAB in the last 72 hours.  Invalid input(s): FREET3 Anemia Panel: No results for input(s): VITAMINB12, FOLATE, FERRITIN, TIBC, IRON, RETICCTPCT in the last 72 hours.  Imaging: No results found.  Cardiac Studies:  ECG:  ST normal no acute ST elevation normal QT   Telemetry:  NSR 90  Echo:  Two previous echos with normal LV function  Medications:   . ampicillin-sulbactam (UNASYN) IV  3 g Intravenous Q8H  . antiseptic oral rinse  7 mL Mouth Rinse QID  . aspirin  325 mg Oral QHS  . cholecalciferol  2,000 Units Oral Daily  . collagenase   Topical Daily  . darbepoetin (ARANESP) injection - NON-DIALYSIS  60 mcg Subcutaneous Q14 Days  . docusate sodium  100 mg Oral BID  . feeding supplement  1 Container Oral TID BM  . heparin subcutaneous  5,000 Units Subcutaneous Q8H  . insulin aspart  0-20 Units Subcutaneous TID WC  . insulin aspart  0-5 Units Subcutaneous QHS  . insulin glargine  10 Units Subcutaneous QHS  . multivitamin with minerals  1 tablet Oral Daily  . pantoprazole  40  mg Oral Q1200  . sodium bicarbonate      . sodium chloride  500 mL Intravenous Once  . thiamine  100 mg Oral Daily     . sodium chloride 10 mL/hr at 11/13/15 2241  . phenylephrine (NEO-SYNEPHRINE) Adult infusion      Assessment/Plan:  Cardiac Arrest:  Prolonged down time in setting of previous spinal cord injury.  Discussed with Dr Tyson Alias. Despite Cr being transiently better 1.75 no indication for cath as enzymes have been negative EF has been normal and no  Acute ECG changes. Overall prognosis poor. They will not withrdraw support with vent / pressors but will not Code again  Charlton Haws 12/01/2015, 9:59 AM

## 2015-12-07 NOTE — Consult Note (Signed)
WOC ostomy follow up When I arrived to [facility] patient has coded again and has been intubated and transferred to 11M09. Arrived to 11M to assess, patient is unstable. Still with low BPs, 2 bedside nurses with patient Stoma type/location: LLQ, end colostomy Output bedside nursing staff report large volumes of output Ostomy pouching: 1pc pouch placed after code due to leakage, intact today at my arrival to the unit.  WOC team will remain available to patient when appropriate.   Kassondra Geil Ripon RN,CWOCN [phone]

## 2015-12-07 NOTE — Procedures (Signed)
Arterial Catheter Insertion Procedure Note Shane Dougherty [id number] 04/21/1976  Procedure: Insertion of Arterial Catheter  Indications: Blood pressure monitoring, Frequent blood sampling and coding  Procedure Details Consent: Unable to obtain consent because of emergent medical necessity. Time Out: Verified patient identification, verified procedure, site/side was marked, verified correct patient position, special equipment/implants available, medications/allergies/relevent history reviewed, required imaging and test results available.  Performed  Maximum sterile technique was used including antiseptics, cap, gloves, gown, hand hygiene, mask and sheet. Skin prep: Chlorhexidine; local anesthetic administered 20 gauge catheter was inserted into right femoral artery using the Seldinger technique.  Evaluation Blood flow good; BP tracing good. Complications: No apparent complications.   Nelda Bucks 11/25/2015   [place]  [name]. Tyson Alias, MD, FACP Pgr: [phone]  Pulmonary & Critical Care

## 2015-12-07 DEATH — deceased

## 2015-12-28 NOTE — Telephone Encounter (Signed)
Patient is now deceased

## 2016-09-26 IMAGING — MR MR HEAD W/O CM
9 of 11 series · 35 of 48 positions shown · non-contrast
Comparison: None.

CLINICAL DATA: Four days of RIGHT facial droop. Difficulty chewing
and blinking. Intermittent frontal headaches. History of diabetes.

EXAM:
MRI HEAD WITHOUT CONTRAST
TECHNIQUE: Multiplanar, multiecho pulse sequences of the brain and surrounding
structures were obtained without intravenous contrast.

[Series 2: FLAIR · sagittal · 5.0mm · 0.47mm/px · 2 of 23 slices shown (1 of 2)]
[im 1/23]
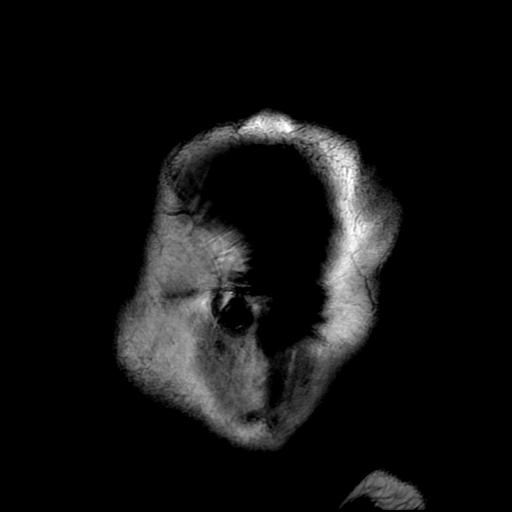
[im 23/23]
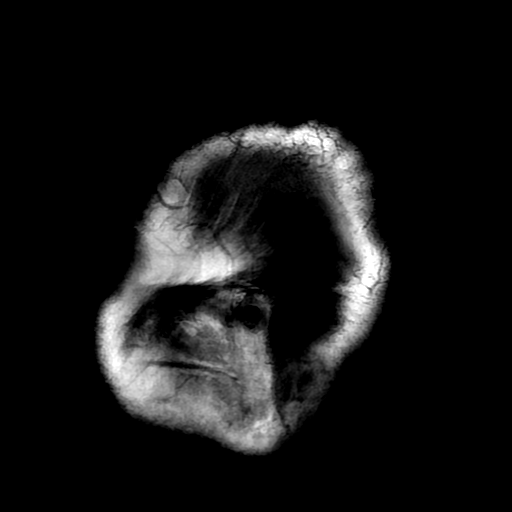

[Series 4: DWI · axial · 3.0mm · 0.94mm/px · z∈[-95,+72]mm · 9 of 114 slices shown (1 of 4)]
[im 1/114]
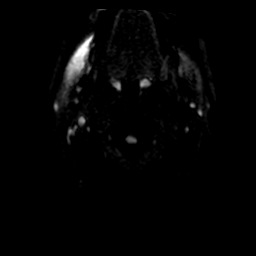
[im 15/114]
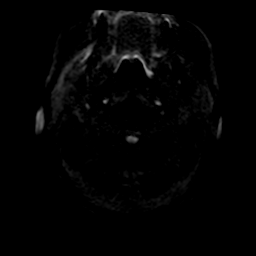
[im 29/114]
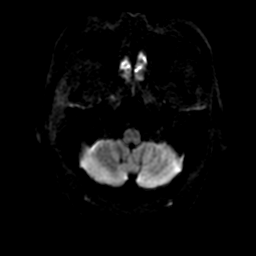
[im 43/114]
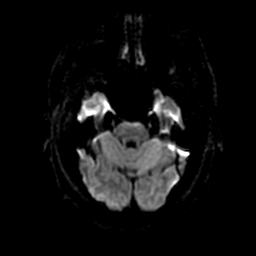
[im 57/114]
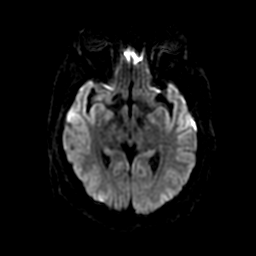
[im 71/114]
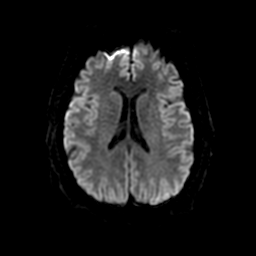
[im 85/114]
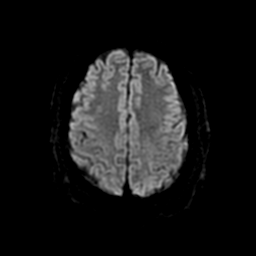
[im 99/114]
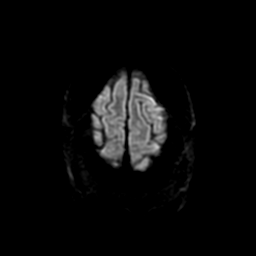
[im 114/114]
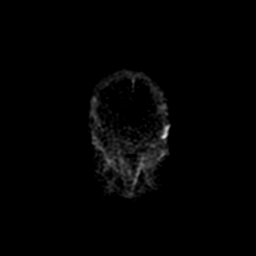

[Series 5: T2 · axial · 5.0mm · 0.47mm/px · z∈[-73,+75]mm · 2 of 26 slices shown (1 of 2)]
[im 1/26]
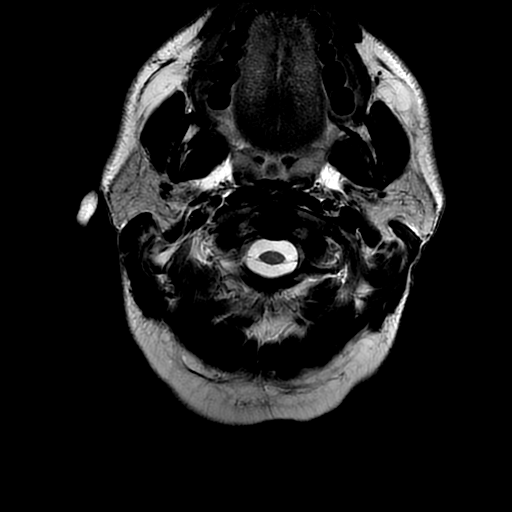
[im 26/26]
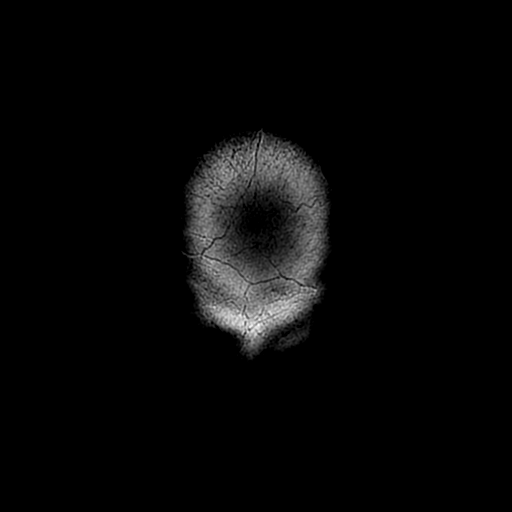

[Series 6: FLAIR · axial · 5.0mm · 0.47mm/px · z∈[-73,+75]mm · 2 of 26 slices shown (2 of 2)]
[im 1/26]
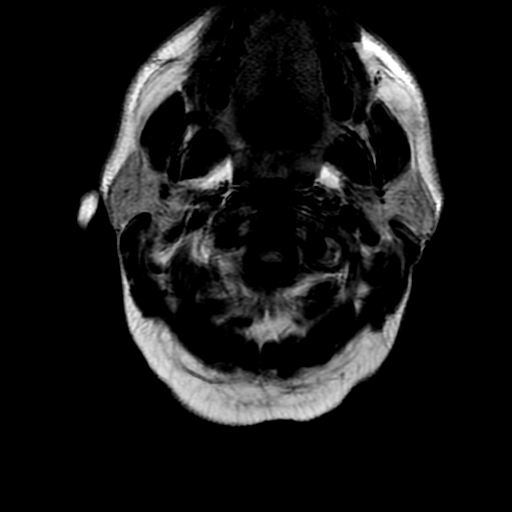
[im 26/26]
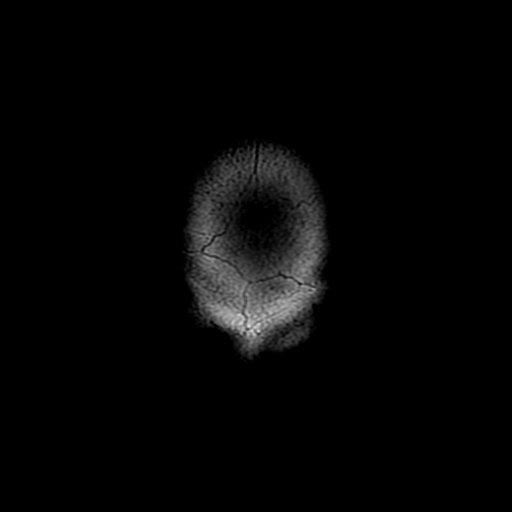

[Series 7: DWI · coronal · 5.0mm · 0.94mm/px · 6 of 77 slices shown (2 of 4)]
[im 1/77]
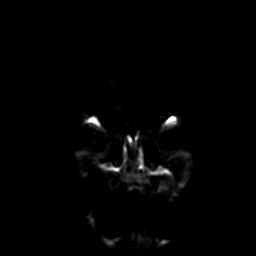
[im 16/77]
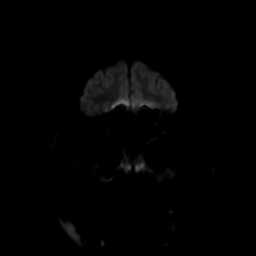
[im 31/77]
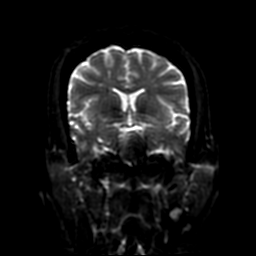
[im 46/77]
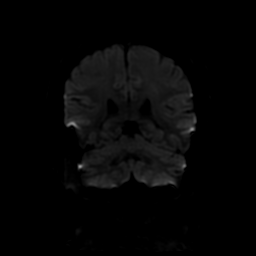
[im 61/77]
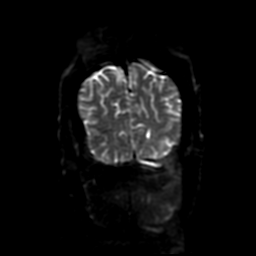
[im 77/77]
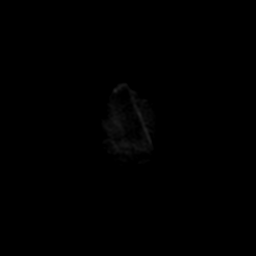

[Series 8: (person_name) · axial · 3.0mm · 0.47mm/px · z∈[-80,-39]mm · 3 of 100 slices shown]
[im 1/100]
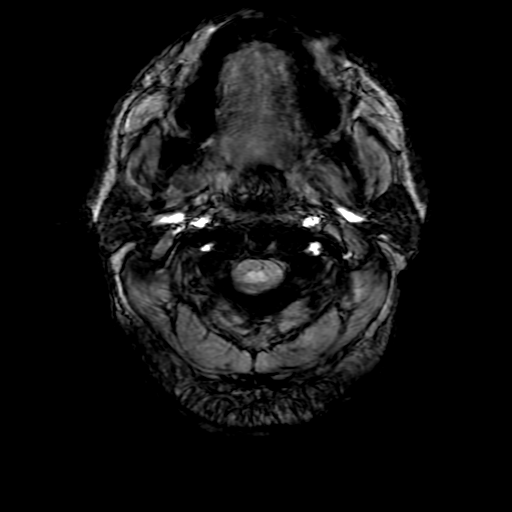
[im 15/100]
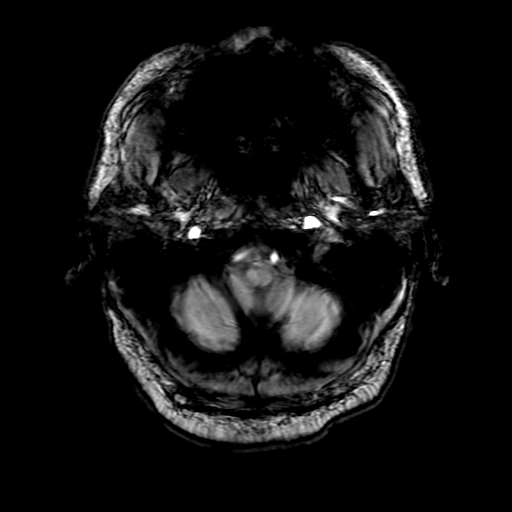
[im 29/100]
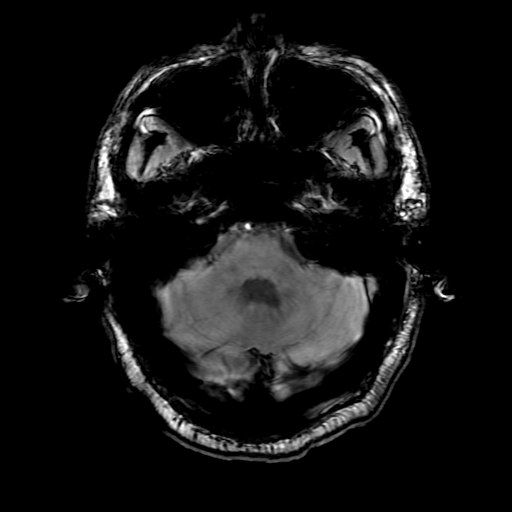

[Series 10: T2 · coronal · 5.0mm · 0.47mm/px · 3 of 31 slices shown (2 of 2)]
[im 1/31]
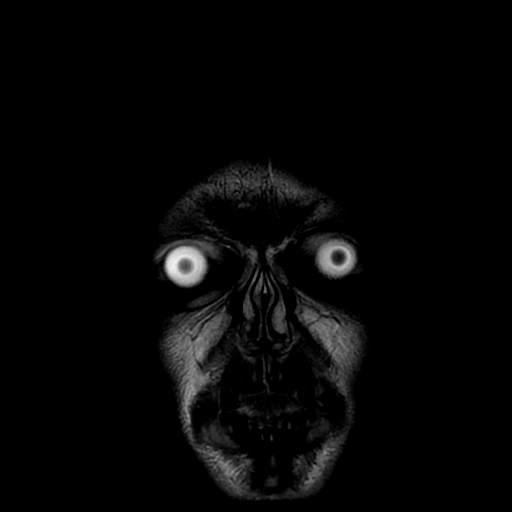
[im 16/31]
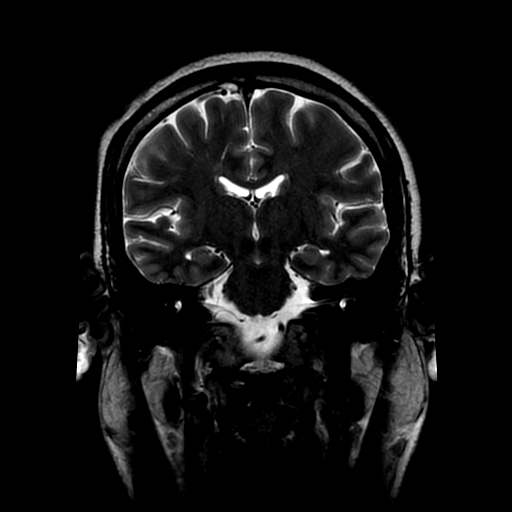
[im 31/31]
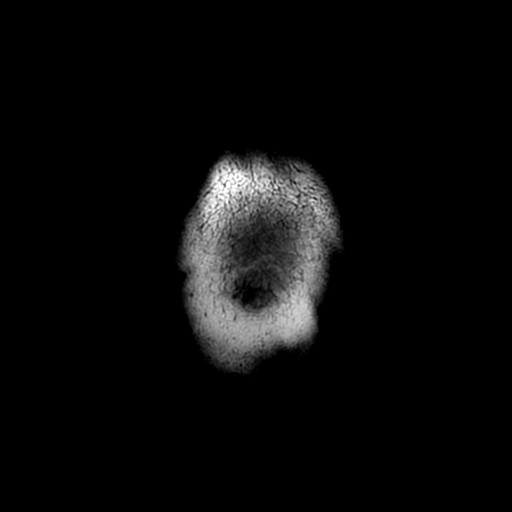

[Series 400: DWI · axial · 3.0mm · 0.94mm/px · z∈[-95,+72]mm · 5 of 57 slices shown (3 of 4)]
[im 1/57]
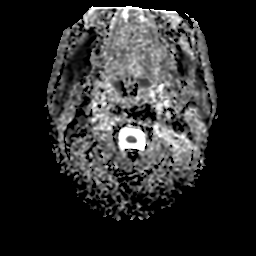
[im 15/57]
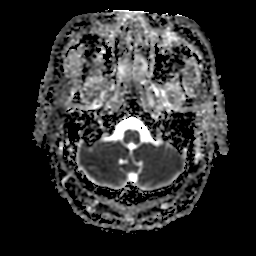
[im 29/57]
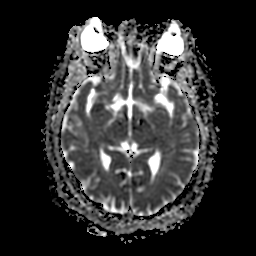
[im 43/57]
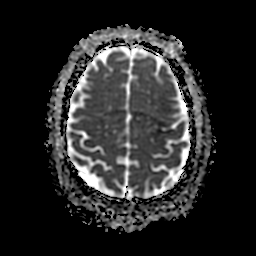
[im 57/57]
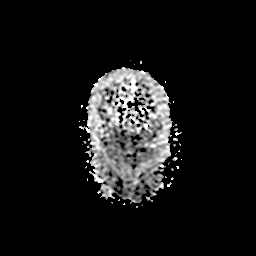

[Series 700: DWI · coronal · 5.0mm · 0.94mm/px · 3 of 38 slices shown (4 of 4)]
[im 1/38]
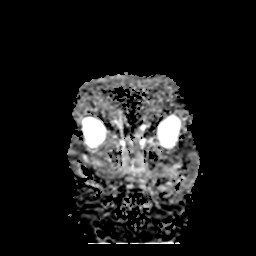
[im 19/38]
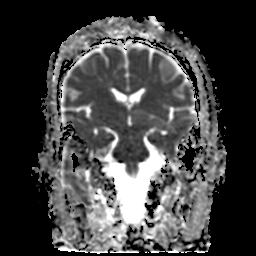
[im 38/38]
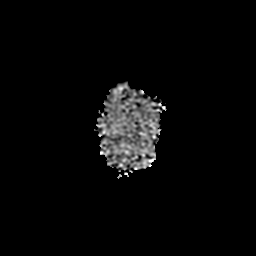

[35 of 48 positions shown; findings below may reference images not displayed]

FINDINGS: The patient was unable to remain motionless for the exam. Small or
subtle lesions could be overlooked.

No evidence for acute infarction, hemorrhage, mass lesion,
hydrocephalus, or extra-axial fluid. Normal for age cerebral volume.
No white matter disease. No midline abnormality or vascular
occlusion.

Extracranial soft tissues demonstrate normal paranasal sinuses and
orbits. BILATERAL cervical lymph nodes are incompletely evaluated on
this study.

Trace BILATERAL mastoid effusions appear non worrisome. No middle
ear fluid. No visible parotid lesion.
IMPRESSION: No evidence for acute intracranial infarction or mass lesion. No
posterior fossa pathology.

Trace BILATERAL mastoid effusions without apparent significant
temporal bone inflammatory disease.

If the patient has peripheral CN VIIth nerve dysfunction, viral
perineuritis or Bell's palsy may not be detected on noncontrast MR
imaging.

## 2017-05-10 IMAGING — DX DG CHEST 2V
2 series · 2 of 2 positions shown · non-contrast
Comparison: July 30, 2010.

CLINICAL DATA: Left-sided chest pain.

EXAM:
CHEST  2 VIEW

[chest pa]
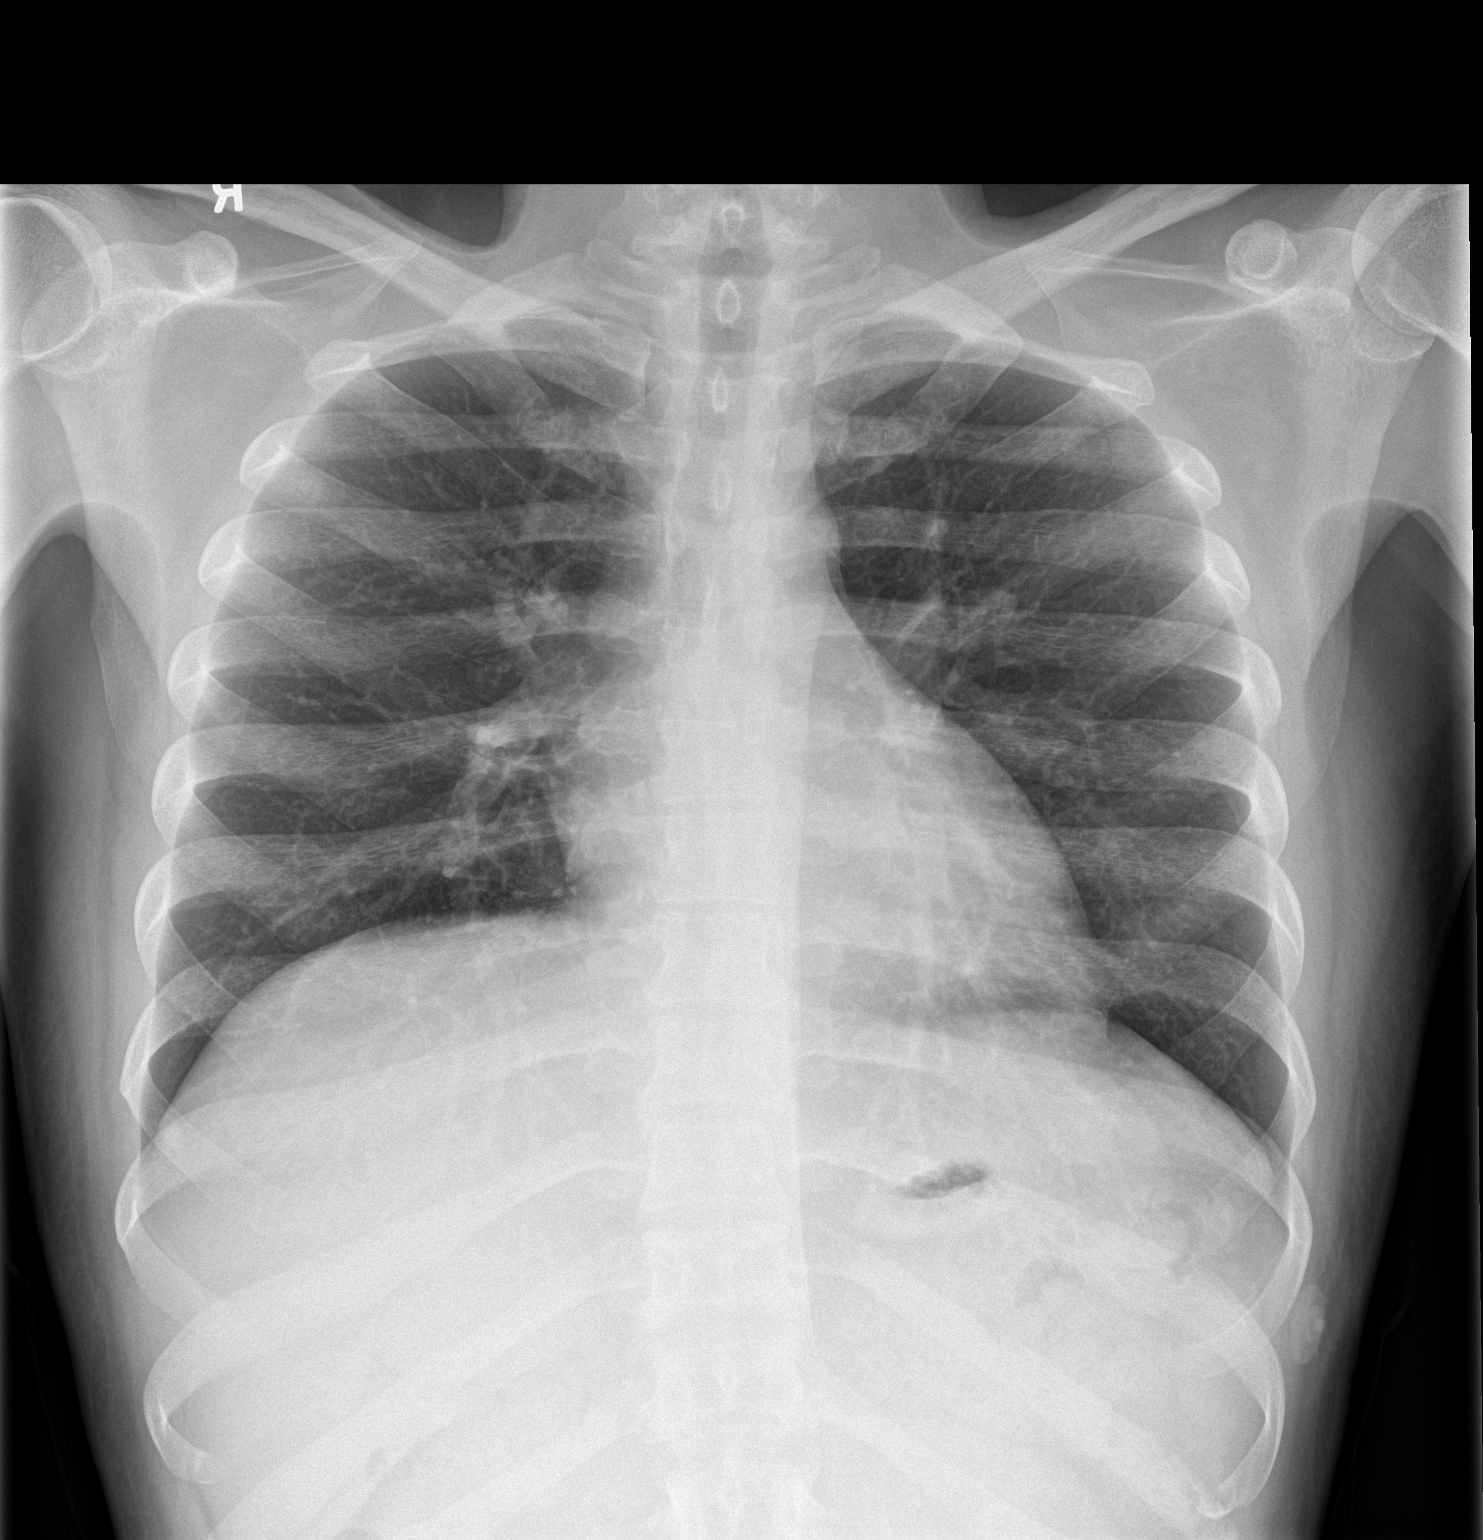

[chest lat]
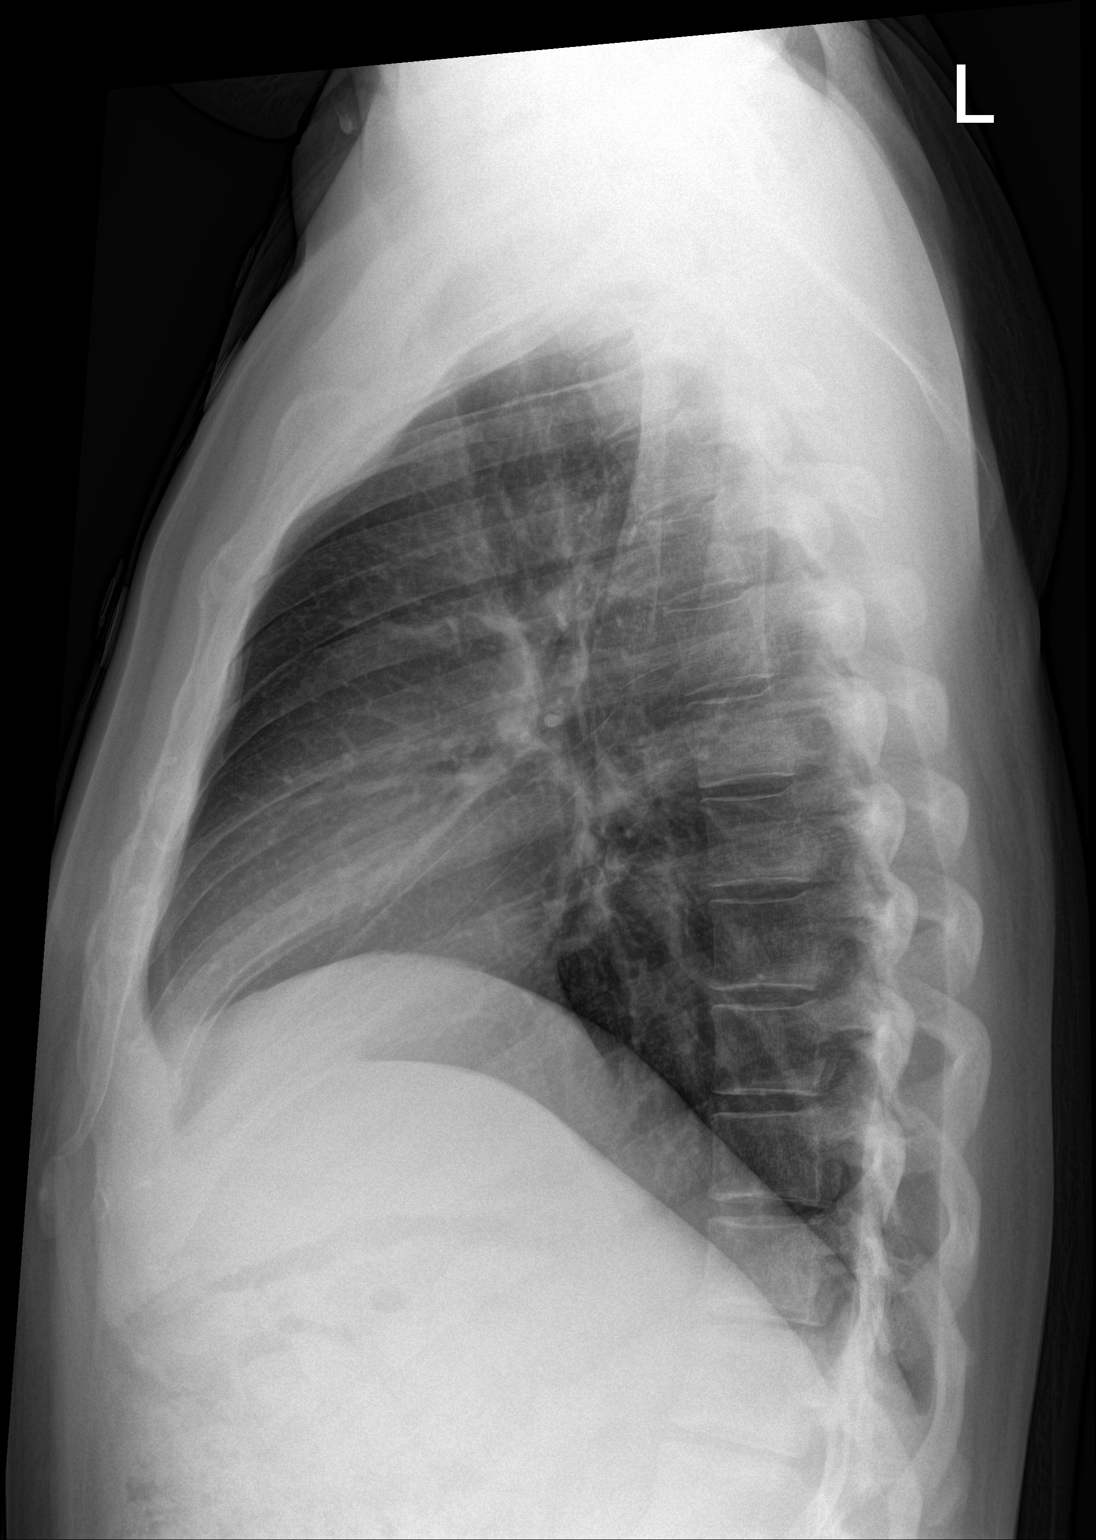

[2 of 2 positions shown; findings below may reference images not displayed]

FINDINGS: The heart size and mediastinal contours are within normal limits.
Both lungs are clear. No pneumothorax or pleural effusion is noted.
The visualized skeletal structures are unremarkable.
IMPRESSION: No active cardiopulmonary disease.
